# Patient Record
Sex: Female | Born: 1950 | Race: Black or African American | Hispanic: No | Smoking: Never smoker
Health system: Southern US, Community
[De-identification: ages and names within clinical notes are randomized; demographics above are authoritative.]

## PROBLEM LIST (undated history)

## (undated) DIAGNOSIS — R42 Dizziness and giddiness: Secondary | ICD-10-CM

## (undated) DIAGNOSIS — M810 Age-related osteoporosis without current pathological fracture: Secondary | ICD-10-CM

## (undated) DIAGNOSIS — J302 Other seasonal allergic rhinitis: Secondary | ICD-10-CM

## (undated) DIAGNOSIS — E039 Hypothyroidism, unspecified: Secondary | ICD-10-CM

## (undated) DIAGNOSIS — D649 Anemia, unspecified: Secondary | ICD-10-CM

## (undated) DIAGNOSIS — Z973 Presence of spectacles and contact lenses: Secondary | ICD-10-CM

## (undated) DIAGNOSIS — F419 Anxiety disorder, unspecified: Secondary | ICD-10-CM

## (undated) DIAGNOSIS — R7303 Prediabetes: Secondary | ICD-10-CM

## (undated) DIAGNOSIS — F32A Depression, unspecified: Secondary | ICD-10-CM

## (undated) DIAGNOSIS — M549 Dorsalgia, unspecified: Secondary | ICD-10-CM

## (undated) DIAGNOSIS — E876 Hypokalemia: Secondary | ICD-10-CM

## (undated) DIAGNOSIS — F329 Major depressive disorder, single episode, unspecified: Secondary | ICD-10-CM

## (undated) HISTORY — DX: Major depressive disorder, single episode, unspecified: F32.9

## (undated) HISTORY — DX: Dizziness and giddiness: R42

## (undated) HISTORY — PX: BREAST BIOPSY: SHX20

## (undated) HISTORY — DX: Depression, unspecified: F32.A

## (undated) HISTORY — DX: Dorsalgia, unspecified: M54.9

## (undated) HISTORY — PX: DILATION AND CURETTAGE OF UTERUS: SHX78

## (undated) HISTORY — DX: Anxiety disorder, unspecified: F41.9

## (undated) HISTORY — DX: Hypokalemia: E87.6

## (undated) HISTORY — PX: BREAST EXCISIONAL BIOPSY: SUR124

## (undated) HISTORY — PX: BACK SURGERY: SHX140

## (undated) HISTORY — DX: Prediabetes: R73.03

## (undated) HISTORY — PX: TUBAL LIGATION: SHX77

---

## 2001-06-27 ENCOUNTER — Encounter: Payer: Self-pay | Admitting: Obstetrics and Gynecology

## 2001-06-27 ENCOUNTER — Ambulatory Visit (HOSPITAL_COMMUNITY): Admission: RE | Admit: 2001-06-27 | Discharge: 2001-06-27 | Payer: Self-pay | Admitting: Obstetrics and Gynecology

## 2001-07-09 ENCOUNTER — Other Ambulatory Visit: Admission: RE | Admit: 2001-07-09 | Discharge: 2001-07-09 | Payer: Self-pay | Admitting: Obstetrics and Gynecology

## 2001-11-05 ENCOUNTER — Ambulatory Visit (HOSPITAL_COMMUNITY): Admission: RE | Admit: 2001-11-05 | Discharge: 2001-11-05 | Payer: Self-pay | Admitting: Internal Medicine

## 2001-11-05 ENCOUNTER — Encounter: Payer: Self-pay | Admitting: Internal Medicine

## 2002-07-10 ENCOUNTER — Encounter: Payer: Self-pay | Admitting: Internal Medicine

## 2002-07-10 ENCOUNTER — Ambulatory Visit (HOSPITAL_COMMUNITY): Admission: RE | Admit: 2002-07-10 | Discharge: 2002-07-10 | Payer: Self-pay | Admitting: Internal Medicine

## 2003-07-12 ENCOUNTER — Ambulatory Visit (HOSPITAL_COMMUNITY): Admission: RE | Admit: 2003-07-12 | Discharge: 2003-07-12 | Payer: Self-pay | Admitting: Obstetrics and Gynecology

## 2003-07-12 ENCOUNTER — Encounter: Payer: Self-pay | Admitting: Obstetrics and Gynecology

## 2004-01-28 ENCOUNTER — Encounter: Payer: Self-pay | Admitting: Family Medicine

## 2004-01-28 ENCOUNTER — Ambulatory Visit (HOSPITAL_COMMUNITY): Admission: RE | Admit: 2004-01-28 | Discharge: 2004-01-28 | Payer: Self-pay | Admitting: Internal Medicine

## 2004-01-28 LAB — HM COLONOSCOPY: HM Colonoscopy: NORMAL

## 2004-01-31 ENCOUNTER — Ambulatory Visit (HOSPITAL_COMMUNITY): Admission: RE | Admit: 2004-01-31 | Discharge: 2004-01-31 | Payer: Self-pay | Admitting: Internal Medicine

## 2005-07-30 ENCOUNTER — Ambulatory Visit (HOSPITAL_COMMUNITY): Admission: RE | Admit: 2005-07-30 | Discharge: 2005-07-30 | Payer: Self-pay | Admitting: Obstetrics and Gynecology

## 2006-08-05 ENCOUNTER — Ambulatory Visit (HOSPITAL_COMMUNITY): Admission: RE | Admit: 2006-08-05 | Discharge: 2006-08-05 | Payer: Self-pay | Admitting: Obstetrics and Gynecology

## 2007-07-11 ENCOUNTER — Ambulatory Visit: Payer: Self-pay | Admitting: Family Medicine

## 2007-07-17 ENCOUNTER — Encounter: Payer: Self-pay | Admitting: Family Medicine

## 2007-07-17 LAB — CONVERTED CEMR LAB
HDL: 63 mg/dL (ref >39)
Total CHOL/HDL Ratio: 3

## 2007-08-08 ENCOUNTER — Ambulatory Visit (HOSPITAL_COMMUNITY): Admission: RE | Admit: 2007-08-08 | Discharge: 2007-08-08 | Payer: Self-pay | Admitting: Family Medicine

## 2007-08-29 ENCOUNTER — Ambulatory Visit: Payer: Self-pay | Admitting: Family Medicine

## 2007-08-29 ENCOUNTER — Encounter: Payer: Self-pay | Admitting: Family Medicine

## 2007-08-29 ENCOUNTER — Other Ambulatory Visit: Admission: RE | Admit: 2007-08-29 | Discharge: 2007-08-29 | Payer: Self-pay | Admitting: Family Medicine

## 2007-08-29 LAB — CONVERTED CEMR LAB: Pap Smear: NORMAL

## 2008-01-01 ENCOUNTER — Encounter: Payer: Self-pay | Admitting: Family Medicine

## 2008-02-19 ENCOUNTER — Ambulatory Visit: Payer: Self-pay | Admitting: Family Medicine

## 2008-03-05 ENCOUNTER — Emergency Department (HOSPITAL_COMMUNITY): Admission: EM | Admit: 2008-03-05 | Discharge: 2008-03-05 | Payer: Self-pay | Admitting: Emergency Medicine

## 2008-03-09 ENCOUNTER — Ambulatory Visit: Payer: Self-pay | Admitting: Family Medicine

## 2008-03-18 ENCOUNTER — Ambulatory Visit (HOSPITAL_COMMUNITY): Admission: RE | Admit: 2008-03-18 | Discharge: 2008-03-18 | Payer: Self-pay | Admitting: Neurosurgery

## 2008-04-02 ENCOUNTER — Ambulatory Visit: Payer: Self-pay | Admitting: Family Medicine

## 2008-04-06 ENCOUNTER — Ambulatory Visit (HOSPITAL_COMMUNITY): Admission: RE | Admit: 2008-04-06 | Discharge: 2008-04-07 | Payer: Self-pay | Admitting: Neurosurgery

## 2008-04-07 ENCOUNTER — Encounter: Payer: Self-pay | Admitting: Family Medicine

## 2008-04-07 DIAGNOSIS — J309 Allergic rhinitis, unspecified: Secondary | ICD-10-CM

## 2008-04-07 DIAGNOSIS — M549 Dorsalgia, unspecified: Secondary | ICD-10-CM | POA: Insufficient documentation

## 2008-04-07 DIAGNOSIS — R42 Dizziness and giddiness: Secondary | ICD-10-CM

## 2008-05-18 ENCOUNTER — Ambulatory Visit: Payer: Self-pay | Admitting: Family Medicine

## 2008-08-10 ENCOUNTER — Ambulatory Visit (HOSPITAL_COMMUNITY): Admission: RE | Admit: 2008-08-10 | Discharge: 2008-08-10 | Payer: Self-pay | Admitting: Family Medicine

## 2008-10-26 ENCOUNTER — Other Ambulatory Visit: Admission: RE | Admit: 2008-10-26 | Discharge: 2008-10-26 | Payer: Self-pay | Admitting: Family Medicine

## 2008-10-26 ENCOUNTER — Encounter: Payer: Self-pay | Admitting: Family Medicine

## 2008-10-26 ENCOUNTER — Ambulatory Visit: Payer: Self-pay | Admitting: Family Medicine

## 2008-11-04 ENCOUNTER — Encounter: Payer: Self-pay | Admitting: Family Medicine

## 2008-11-09 ENCOUNTER — Ambulatory Visit: Payer: Self-pay | Admitting: Family Medicine

## 2008-11-09 DIAGNOSIS — F419 Anxiety disorder, unspecified: Secondary | ICD-10-CM | POA: Insufficient documentation

## 2008-11-09 DIAGNOSIS — F411 Generalized anxiety disorder: Secondary | ICD-10-CM

## 2008-11-09 LAB — CONVERTED CEMR LAB: Glucose, Bld: 88 mg/dL

## 2008-11-18 ENCOUNTER — Encounter: Payer: Self-pay | Admitting: Family Medicine

## 2008-11-19 LAB — CONVERTED CEMR LAB: TSH: 1.573 microintl units/mL (ref 0.350–4.50)

## 2008-11-24 ENCOUNTER — Encounter: Payer: Self-pay | Admitting: Family Medicine

## 2008-12-29 ENCOUNTER — Ambulatory Visit: Payer: Self-pay | Admitting: Family Medicine

## 2009-03-04 ENCOUNTER — Encounter: Payer: Self-pay | Admitting: Family Medicine

## 2009-04-06 ENCOUNTER — Encounter: Payer: Self-pay | Admitting: Family Medicine

## 2009-04-06 ENCOUNTER — Telehealth: Payer: Self-pay | Admitting: Family Medicine

## 2009-04-21 ENCOUNTER — Ambulatory Visit: Payer: Self-pay | Admitting: Family Medicine

## 2009-07-12 ENCOUNTER — Ambulatory Visit: Payer: Self-pay | Admitting: Family Medicine

## 2009-07-12 DIAGNOSIS — M25519 Pain in unspecified shoulder: Secondary | ICD-10-CM

## 2009-07-12 DIAGNOSIS — F329 Major depressive disorder, single episode, unspecified: Secondary | ICD-10-CM

## 2009-07-29 ENCOUNTER — Encounter: Payer: Self-pay | Admitting: Family Medicine

## 2009-08-04 ENCOUNTER — Ambulatory Visit (HOSPITAL_COMMUNITY): Payer: Self-pay | Admitting: Psychiatry

## 2009-08-12 ENCOUNTER — Ambulatory Visit (HOSPITAL_COMMUNITY): Payer: Self-pay | Admitting: Psychiatry

## 2009-08-16 ENCOUNTER — Ambulatory Visit (HOSPITAL_COMMUNITY): Admission: RE | Admit: 2009-08-16 | Discharge: 2009-08-16 | Payer: Self-pay | Admitting: Family Medicine

## 2009-08-18 ENCOUNTER — Ambulatory Visit (HOSPITAL_COMMUNITY): Payer: Self-pay | Admitting: Psychiatry

## 2009-08-30 ENCOUNTER — Ambulatory Visit: Payer: Self-pay | Admitting: Family Medicine

## 2009-08-30 DIAGNOSIS — G4700 Insomnia, unspecified: Secondary | ICD-10-CM | POA: Insufficient documentation

## 2009-09-02 ENCOUNTER — Ambulatory Visit (HOSPITAL_COMMUNITY): Payer: Self-pay | Admitting: Psychiatry

## 2009-09-06 ENCOUNTER — Telehealth: Payer: Self-pay | Admitting: Family Medicine

## 2009-09-16 ENCOUNTER — Ambulatory Visit (HOSPITAL_COMMUNITY): Payer: Self-pay | Admitting: Psychiatry

## 2009-09-22 ENCOUNTER — Ambulatory Visit (HOSPITAL_COMMUNITY): Payer: Self-pay | Admitting: Psychiatry

## 2009-10-03 ENCOUNTER — Encounter: Payer: Self-pay | Admitting: Family Medicine

## 2009-10-04 ENCOUNTER — Ambulatory Visit: Payer: Self-pay | Admitting: Family Medicine

## 2009-10-04 ENCOUNTER — Ambulatory Visit (HOSPITAL_COMMUNITY): Payer: Self-pay | Admitting: Psychiatry

## 2009-10-06 ENCOUNTER — Ambulatory Visit (HOSPITAL_COMMUNITY): Payer: Self-pay | Admitting: Psychiatry

## 2009-10-13 ENCOUNTER — Ambulatory Visit (HOSPITAL_COMMUNITY): Payer: Self-pay | Admitting: Psychiatry

## 2009-10-17 ENCOUNTER — Ambulatory Visit: Payer: Self-pay | Admitting: Family Medicine

## 2009-10-20 ENCOUNTER — Ambulatory Visit (HOSPITAL_COMMUNITY): Payer: Self-pay | Admitting: Psychiatry

## 2009-10-27 ENCOUNTER — Ambulatory Visit (HOSPITAL_COMMUNITY): Payer: Self-pay | Admitting: Psychiatry

## 2009-11-02 ENCOUNTER — Ambulatory Visit (HOSPITAL_COMMUNITY): Payer: Self-pay | Admitting: Psychiatry

## 2009-11-10 ENCOUNTER — Ambulatory Visit (HOSPITAL_COMMUNITY): Payer: Self-pay | Admitting: Psychiatry

## 2009-11-16 ENCOUNTER — Ambulatory Visit (HOSPITAL_COMMUNITY): Payer: Self-pay | Admitting: Psychiatry

## 2009-11-17 ENCOUNTER — Telehealth: Payer: Self-pay | Admitting: Family Medicine

## 2009-11-21 ENCOUNTER — Ambulatory Visit (HOSPITAL_COMMUNITY): Payer: Self-pay | Admitting: Psychiatry

## 2009-11-21 ENCOUNTER — Telehealth: Payer: Self-pay | Admitting: Family Medicine

## 2009-12-09 ENCOUNTER — Ambulatory Visit: Payer: Self-pay | Admitting: Family Medicine

## 2009-12-09 ENCOUNTER — Other Ambulatory Visit: Admission: RE | Admit: 2009-12-09 | Discharge: 2009-12-09 | Payer: Self-pay | Admitting: Family Medicine

## 2009-12-09 LAB — CONVERTED CEMR LAB
Bilirubin Urine: NEGATIVE
Blood in Urine, dipstick: NEGATIVE
Glucose, Urine, Semiquant: NEGATIVE
Nitrite: NEGATIVE
OCCULT 1: NEGATIVE
Urobilinogen, UA: NEGATIVE

## 2009-12-12 ENCOUNTER — Ambulatory Visit (HOSPITAL_COMMUNITY): Payer: Self-pay | Admitting: Psychiatry

## 2009-12-12 ENCOUNTER — Encounter: Payer: Self-pay | Admitting: Family Medicine

## 2009-12-12 LAB — CONVERTED CEMR LAB
BUN: 10 mg/dL (ref 6–23)
CO2: 20 meq/L (ref 19–32)
Cholesterol: 181 mg/dL (ref 0–200)
Creatinine, Ser: 0.63 mg/dL (ref 0.40–1.20)
Eosinophils Absolute: 0.1 10*3/uL (ref 0.0–0.7)
HDL: 55 mg/dL (ref >39)
Hemoglobin: 11.4 g/dL — ABNORMAL LOW (ref 12.0–15.0)
LDL Cholesterol: 107 mg/dL — ABNORMAL HIGH (ref 0–99)
Lymphocytes Relative: 37 % (ref 12–46)
MCHC: 31.3 g/dL (ref 30.0–36.0)
Monocytes Absolute: 0.4 10*3/uL (ref 0.1–1.0)
Potassium: 4.1 meq/L (ref 3.5–5.3)
RBC: 4.33 M/uL (ref 3.87–5.11)
Sodium: 143 meq/L (ref 135–145)
Total CHOL/HDL Ratio: 3.3
WBC: 4.8 10*3/uL (ref 4.0–10.5)

## 2009-12-20 ENCOUNTER — Ambulatory Visit (HOSPITAL_COMMUNITY): Payer: Self-pay | Admitting: Psychiatry

## 2010-01-31 ENCOUNTER — Ambulatory Visit (HOSPITAL_COMMUNITY): Payer: Self-pay | Admitting: Psychiatry

## 2010-02-08 ENCOUNTER — Ambulatory Visit (HOSPITAL_COMMUNITY): Payer: Self-pay | Admitting: Psychiatry

## 2010-02-21 ENCOUNTER — Ambulatory Visit (HOSPITAL_COMMUNITY): Payer: Self-pay | Admitting: Psychiatry

## 2010-02-28 ENCOUNTER — Ambulatory Visit (HOSPITAL_COMMUNITY): Payer: Self-pay | Admitting: Psychiatry

## 2010-03-07 ENCOUNTER — Ambulatory Visit (HOSPITAL_COMMUNITY): Payer: Self-pay | Admitting: Psychiatry

## 2010-03-10 ENCOUNTER — Encounter: Payer: Self-pay | Admitting: Family Medicine

## 2010-03-24 ENCOUNTER — Ambulatory Visit: Payer: Self-pay | Admitting: Family Medicine

## 2010-03-24 DIAGNOSIS — D4959 Neoplasm of unspecified behavior of other genitourinary organ: Secondary | ICD-10-CM

## 2010-03-28 ENCOUNTER — Ambulatory Visit (HOSPITAL_COMMUNITY): Payer: Self-pay | Admitting: Psychiatry

## 2010-04-10 ENCOUNTER — Encounter: Payer: Self-pay | Admitting: Family Medicine

## 2010-04-18 ENCOUNTER — Ambulatory Visit (HOSPITAL_COMMUNITY): Payer: Self-pay | Admitting: Psychiatry

## 2010-04-26 ENCOUNTER — Ambulatory Visit: Payer: Self-pay | Admitting: Family Medicine

## 2010-04-26 DIAGNOSIS — J209 Acute bronchitis, unspecified: Secondary | ICD-10-CM | POA: Insufficient documentation

## 2010-05-02 ENCOUNTER — Ambulatory Visit (HOSPITAL_COMMUNITY): Payer: Self-pay | Admitting: Psychiatry

## 2010-05-04 ENCOUNTER — Ambulatory Visit (HOSPITAL_COMMUNITY): Payer: Self-pay | Admitting: Psychiatry

## 2010-05-18 ENCOUNTER — Ambulatory Visit (HOSPITAL_COMMUNITY): Payer: Self-pay | Admitting: Psychiatry

## 2010-06-01 ENCOUNTER — Ambulatory Visit (HOSPITAL_COMMUNITY): Payer: Self-pay | Admitting: Psychiatry

## 2010-06-15 ENCOUNTER — Ambulatory Visit (HOSPITAL_COMMUNITY): Payer: Self-pay | Admitting: Psychiatry

## 2010-06-29 ENCOUNTER — Ambulatory Visit (HOSPITAL_COMMUNITY): Payer: Self-pay | Admitting: Psychiatry

## 2010-07-06 ENCOUNTER — Ambulatory Visit (HOSPITAL_COMMUNITY): Payer: Self-pay | Admitting: Psychiatry

## 2010-07-27 ENCOUNTER — Ambulatory Visit (HOSPITAL_COMMUNITY): Payer: Self-pay | Admitting: Psychiatry

## 2010-08-17 ENCOUNTER — Ambulatory Visit: Payer: Self-pay | Admitting: Family Medicine

## 2010-08-17 ENCOUNTER — Ambulatory Visit (HOSPITAL_COMMUNITY): Admission: RE | Admit: 2010-08-17 | Discharge: 2010-08-17 | Payer: Self-pay | Admitting: Family Medicine

## 2010-08-18 ENCOUNTER — Ambulatory Visit (HOSPITAL_COMMUNITY): Payer: Self-pay | Admitting: Psychiatry

## 2010-08-24 ENCOUNTER — Ambulatory Visit (HOSPITAL_COMMUNITY): Payer: Self-pay | Admitting: Psychiatry

## 2010-09-05 ENCOUNTER — Ambulatory Visit (HOSPITAL_COMMUNITY): Admission: RE | Admit: 2010-09-05 | Discharge: 2010-09-05 | Payer: Self-pay | Admitting: Family Medicine

## 2010-09-08 ENCOUNTER — Ambulatory Visit (HOSPITAL_COMMUNITY): Payer: Self-pay | Admitting: Psychiatry

## 2010-09-29 ENCOUNTER — Ambulatory Visit (HOSPITAL_COMMUNITY): Payer: Self-pay | Admitting: Psychiatry

## 2010-09-29 ENCOUNTER — Ambulatory Visit: Payer: Self-pay | Admitting: Family Medicine

## 2010-10-05 ENCOUNTER — Ambulatory Visit (HOSPITAL_COMMUNITY): Payer: Self-pay | Admitting: Psychiatry

## 2010-10-16 ENCOUNTER — Ambulatory Visit (HOSPITAL_COMMUNITY): Payer: Self-pay | Admitting: Psychiatry

## 2010-10-16 LAB — CONVERTED CEMR LAB
HDL: 56 mg/dL (ref >39)
Total CHOL/HDL Ratio: 3.8
Triglycerides: 96 mg/dL (ref <150)
VLDL: 19 mg/dL (ref 0–40)

## 2010-10-30 ENCOUNTER — Ambulatory Visit (HOSPITAL_COMMUNITY): Payer: Self-pay | Admitting: Psychiatry

## 2010-11-22 ENCOUNTER — Ambulatory Visit (HOSPITAL_COMMUNITY): Payer: Self-pay | Admitting: Psychiatry

## 2010-12-05 ENCOUNTER — Ambulatory Visit (HOSPITAL_COMMUNITY): Payer: Self-pay | Admitting: Psychiatry

## 2010-12-06 ENCOUNTER — Ambulatory Visit (HOSPITAL_COMMUNITY): Payer: Self-pay | Admitting: Psychiatry

## 2010-12-12 ENCOUNTER — Ambulatory Visit: Payer: Self-pay | Admitting: Family Medicine

## 2010-12-12 ENCOUNTER — Other Ambulatory Visit
Admission: RE | Admit: 2010-12-12 | Discharge: 2010-12-12 | Payer: Self-pay | Source: Home / Self Care | Admitting: Family Medicine

## 2010-12-12 DIAGNOSIS — M810 Age-related osteoporosis without current pathological fracture: Secondary | ICD-10-CM | POA: Insufficient documentation

## 2010-12-15 LAB — CONVERTED CEMR LAB: Pap Smear: NEGATIVE

## 2010-12-19 ENCOUNTER — Encounter: Payer: Self-pay | Admitting: Family Medicine

## 2010-12-27 ENCOUNTER — Ambulatory Visit (HOSPITAL_COMMUNITY): Payer: Self-pay | Admitting: Psychiatry

## 2011-01-17 ENCOUNTER — Ambulatory Visit (HOSPITAL_COMMUNITY)
Admission: RE | Admit: 2011-01-17 | Discharge: 2011-01-17 | Payer: Self-pay | Source: Home / Self Care | Attending: Psychiatry | Admitting: Psychiatry

## 2011-01-30 NOTE — Assessment & Plan Note (Signed)
Summary: sick- room 2   Vital Signs:  Patient profile:   60 year old female Menstrual status:  postmenopausal Height:      61 inches Weight:      107.75 pounds BMI:     20.43 O2 Sat:      97 % on Room air Pulse rate:   73 / minute Resp:     16 per minute BP sitting:   120 / 80  (left arm)  Vitals Entered By: Adella Hare LPN (April 26, 2010 1:29 PM) CC: sore throat, sneezing, coughing  x 4 days Is Patient Diabetic? No Pain Assessment Patient in pain? no      Comments patient did not bring medication today   Primary Provider:  Syliva Overman MD  CC:  sore throat, sneezing, and coughing  x 4 days.  History of Present Illness: This 60 Years Old Black Female comes in today with complaints of cough, runny nose, and sore throat.  She states that her syptoms started 5 days ago and have worsened.  She first noted that her voice was coming & going.  The next day is was worse & she had a sore throat.  The sore throat has now resolved.  She is having post nasal drainage, and a cough. No nasal congestion or drainage though. Her cough has changed from clear phlegm to brownish green.  No fever or chills. Nonsmoker. She has been using throat lozenges and honey with lemon.  Hx of seasonal allergies.  Has used claritin in past but is out.  Has worked well for her.     Allergies (verified): No Known Drug Allergies  Past History:  Past medical history reviewed for relevance to current acute and chronic problems.  Past Medical History: Reviewed history from 04/07/2008 and no changes required. Current Problems:  HYPOKALEMIA (ICD-276.8) ALLERGIC RHINITIS (ICD-477.9) BACK PAIN (ICD-724.5) VERTIGO, CHRONIC (ICD-780.4)  Review of Systems General:  Denies chills and fever. ENT:  Complains of postnasal drainage; denies earache, nasal congestion, sinus pressure, and sore throat. CV:  Denies chest pain or discomfort. Resp:  Complains of cough and sputum productive; denies shortness of  breath. Allergy:  Complains of seasonal allergies and sneezing.  Physical Exam  General:  Well-developed,well-nourished,in no acute distress; alert,appropriate and cooperative throughout examination Head:  Normocephalic and atraumatic without obvious abnormalities. No apparent alopecia or balding. Ears:  External ear exam shows no significant lesions or deformities.  Otoscopic examination reveals clear canals, tympanic membranes are intact bilaterally without bulging, retraction, inflammation or discharge. Hearing is grossly normal bilaterally. Nose:  no external deformity, no nasal discharge, and mucosal pallor.   Mouth:  Oral mucosa and oropharynx without lesions or exudates.   Neck:  No deformities, masses, or tenderness noted. Lungs:  Normal respiratory effort, chest expands symmetrically. Lungs are clear to auscultation, no crackles or wheezes. Heart:  Normal rate and regular rhythm. S1 and S2 normal without gallop, murmur, click, rub or other extra sounds. Cervical Nodes:  No lymphadenopathy noted Psych:  Cognition and judgment appear intact. Alert and cooperative with normal attention span and concentration. No apparent delusions, illusions, hallucinations   Impression & Recommendations:  Problem # 1:  ALLERGIC RHINITIS (ICD-477.9) Assessment Deteriorated  Her updated medication list for this problem includes:    Loratadine 10 Mg Tabs (Loratadine) .[name]... Take 1 daily for allergies  Problem # 2:  ACUTE BRONCHITIS (ICD-466.0) Assessment: New  Her updated medication list for this problem includes:    Zithromax Z-pak 250 Mg Tabs (  Azithromycin) .[name]... Take once daily as directed  Complete Medication List: 1)  Prozac 40 Mg Caps (Fluoxetine hcl) .... Take 1 tab by mouth at bedtime 2)  Zyprexa 2.5 Mg Tabs (Olanzapine) .... Take 1 tab by mouth at bedtime 3)  Oscal 500/200 D-3 500-200 Mg-unit Tabs (Calcium-vitamin d) .... Take 1 tablet by mouth two times a day 4)  Feosol 200 (65 Fe) Mg  Tabs (Ferrous sulfate dried) .... Take 1 tablet by mouth once a day 5)  Aspir-low 81 Mg Tbec (Aspirin) .... Take 1 tablet by mouth once a day 6)  Loratadine 10 Mg Tabs (Loratadine) .... Take 1 daily for allergies 7)  Zithromax Z-pak 250 Mg Tabs (Azithromycin) .... Take once daily as directed  Patient Instructions: 1)  Get plenty of rest, drink lots of clear liquids, and use Tylenol or Ibuprofen for fever and comfort. Return in 7-10 days if you're not better:sooner if you're feeling worse. 2)  Increase fluids. 3)  Voice rest. 4)  You may use cough drops or throat lozenges to help soothe your throat. 5)  I have prescribed Loratadine for allergies, and Zithromax for your antibiotic. Prescriptions: ZITHROMAX Z-PAK 250 MG TABS (AZITHROMYCIN) take once daily as directed  #1 pack x 0   Entered and Authorized by:   Esperanza Sheets PA   Signed by:   Esperanza Sheets PA on 04/26/2010   Method used:   Electronically to        [facility] (retail)       [address]       [place], [place]  [address]       Ph: [phone]       Fax: [phone]   RxID:   [id number] LORATADINE 10 MG TABS (LORATADINE) take 1 daily for allergies  #30 x 5   Entered and Authorized by:   Esperanza Sheets PA   Signed by:   Esperanza Sheets PA on 04/26/2010   Method used:   Electronically to        [facility] (retail)       [address]       [place], [place]  [address]       Ph: [phone]       Fax: [phone]   RxID:   [id number] HYDROCODONE-ACETAMINOPHEN 7.5-325 MG TABS (HYDROCODONE-ACETAMINOPHEN) one tab every 4 hrs as needed  #30 x 0   Entered and Authorized by:   Esperanza Sheets PA   Signed by:   Esperanza Sheets PA on 04/26/2010   Method used:   Printed then faxed to ...       [facility] [address] [phone]* (retail)       [address] [place], [place]  [address]       Ph: [phone]       Fax: [phone]   RxID:   [id number] AMRIX 15 MG  XR24H-CAP (CYCLOBENZAPRINE HCL) take 1 daily at dinner time for muscle spasm  #30 x 0   Entered and Authorized by:   Esperanza Sheets PA   Signed by:   Esperanza Sheets PA on 04/26/2010   Method used:   Printed then faxed to ...       [facility] [address] [phone]* (retail)       [address]       [place], [place]  [address]       Ph: [phone]       Fax: [phone]   RxID:   [id number] MELOXICAM 7.5 MG TABS (MELOXICAM) take 1-2 daily for inflammation & pain  #30 x 0   Entered and Authorized by:   Esperanza Sheets PA   Signed by:   Esperanza Sheets PA on 04/26/2010   Method used:   Printed then faxed to ...       [facility] [address] [phone]* (retail)       [address] [place]  [address]       Ph: [phone]       Fax: [phone]   RxID:   [id number]  this medications were entered in error, not sent to pharmacy Adella Hare LPN  April 26, 2010 1:58 PM

## 2011-01-30 NOTE — Letter (Signed)
Summary: phone notes  phone notes   Imported By: Curtis Sites 06/19/2010 15:31:35  _____________________________________________________________________  External Attachment:    Type:   Image     Comment:   External Document

## 2011-01-30 NOTE — Letter (Signed)
Summary: labs  labs   Imported By: Curtis Sites 06/19/2010 15:31:01  _____________________________________________________________________  External Attachment:    Type:   Image     Comment:   External Document

## 2011-01-30 NOTE — Letter (Signed)
Summary: consults  consults   Imported By: Curtis Sites 06/19/2010 15:30:10  _____________________________________________________________________  External Attachment:    Type:   Image     Comment:   External Document

## 2011-01-30 NOTE — Letter (Signed)
Summary: demographic  demographic   Imported By: Curtis Sites 06/19/2010 15:30:28  _____________________________________________________________________  External Attachment:    Type:   Image     Comment:   External Document

## 2011-01-30 NOTE — Assessment & Plan Note (Signed)
Summary: FOLLOW UP   Vital Signs:  Patient profile:   60 year old female Menstrual status:  postmenopausal Height:      61 inches Weight:      107 pounds BMI:     20.29 O2 Sat:      97 % Pulse rate:   68 / minute Pulse rhythm:   regular Resp:     16 per minute BP sitting:   118 / 70  (left arm) Cuff size:   regular  Vitals Entered By: Everitt Amber LPN (March 24, 2010 9:06 AM) CC: Follow up chronic problems, has some skin tags on her bottom and they have started itching , wants to see about getting them removed   Primary Care Provider:  Syliva Overman MD  CC:  Follow up chronic problems, has some skin tags on her bottom and they have started itching , and wants to see about getting them removed.  History of Present Illness: Reports  thatshe is doing much better. Denies recent fever or chills. Denies sinus pressure, nasal congestion , ear pain or sore throat.She was recently treated in the Ed for sinus infection Denies chest congestion, or cough productive of sputum. Denies chest pain, palpitations, PND, orthopnea or leg swelling. Denies nausea, vomitting, diarrhea or constipation. Denies change in bowel movements or bloody stool. Denies dysuria , frequency, incontinence or hesitancy. . Denies headaches, vertigo, seizures. Denies uncontrolled depression, anxiety or insomnia.she follows with  mental,health regularly, she denies suicidal or homicidal use, she denies hallucinations. reports marked improvement in her anxiety    Current Medications (verified): 1)  Prozac 40 Mg Caps (Fluoxetine Hcl) .... Take 1 Tab By Mouth At Bedtime 2)  Zyprexa 2.5 Mg Tabs (Olanzapine) .... Take 1 Tab By Mouth At Bedtime 3)  Hydrocodone-Acetaminophen 7.5-325 Mg Tabs (Hydrocodone-Acetaminophen) .... One Tab Every 4 Hrs As Needed 4)  Oscal 500/200 D-3 500-200 Mg-Unit Tabs (Calcium-Vitamin D) .... Take 1 Tablet By Mouth Two Times A Day 5)  Feosol 200 (65 Fe) Mg Tabs (Ferrous Sulfate Dried) ....  Take 1 Tablet By Mouth Once A Day 6)  Aspir-Low 81 Mg Tbec (Aspirin) .... Take 1 Tablet By Mouth Once A Day  Allergies (verified): No Known Drug Allergies  Past History:  Past medical, surgical, family and social histories (including risk factors) reviewed, and no changes noted (except as noted below).  Past Medical History: Reviewed history from 04/07/2008 and no changes required. Current Problems:  HYPOKALEMIA (ICD-276.8) ALLERGIC RHINITIS (ICD-477.9) BACK PAIN (ICD-724.5) VERTIGO, CHRONIC (ICD-780.4)  Past Surgical History: Reviewed history from 10/26/2008 and no changes required. Back surgery (1992) and 2009 Dr. Jeral Fruit Tubal ligation (1976)  Family History: Reviewed history from 04/07/2008 and no changes required. Mother HTN<Diabetic Father died  age 63  Sisters 5living and 3 brothers living Two siblings are known as HTN,Diabetic, one asthmatic, and one has thyroid disease  Social History: Reviewed history from 04/07/2008 and no changes required. Disabled since 03/2008  from back pain, had surgery Separated  in 2010, October Three children  Never Smoked Alcohol use-no Drug use-no  Review of Systems      See HPI Eyes:  Denies blurring and discharge. ENT:  Complains of postnasal drainage and sinus pressure; treated fior sinus infwection Feb 18, 2010, currently asymptomatic. GI:  Complains of indigestion; epigastric discomfort last night had not eaten  her supper which was late, pain relieved with activity and also food, no other symptoms. MS:  Complains of joint pain; left elbow pain, uses naprosyn as  needed and hydrocodne as needed from ortho also for her back uses as needed on avg one per week. Derm:  Complains of itching and lesion(s); vulvar and perianal puritic lesions x 2 months , has had before and wants them removed. Endo:  Denies cold intolerance, excessive hunger, excessive thirst, excessive urination, heat intolerance, polyuria, and weight change. Heme:   Denies abnormal bruising and bleeding. Allergy:  Complains of seasonal allergies and sneezing; intermittent, varying times.  Physical Exam  General:  Well-developed,well-nourished,in no acute distress; alert,appropriate and cooperative throughout examination HEENT: No facial asymmetry,  EOMI, No sinus tenderness, TM's Clear, oropharynx  pink and moist.   Chest: Clear to auscultation bilaterally.  CVS: S1, S2, No murmurs, No S3.   Abd: Soft, Nontender.  MS: Adequate ROM spine, hips, shoulders and knees.  Ext: No edema.   CNS: CN 2-12 intact, power tone and sensation normal throughout.   Skin: Intact, no visible lesions or rashes.  Psych: Good eye contact, normal affect.  Memory intact, not anxious or depressed appearing. 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 Take 1 tab by mouth at bedtime  Problem # 4:  ANXIETY STATE, UNSPECIFIED (ICD-300.00) Assessment: Improved  Her updated medication list for this problem includes:    Prozac 40 Mg Caps (Fluoxetine hcl) .[name]... Take 1 tab by mouth at bedtime  Problem # 5:  ALLERGIC RHINITIS (ICD-477.9) Assessment: Unchanged  The following medications were removed from the medication list:    Claritin 10 Mg Tabs (Loratadine) .[name]... Take 1 tablet by mouth once a day  Complete Medication List: 1)  Prozac 40 Mg Caps (Fluoxetine hcl) .... Take 1 tab by mouth at bedtime 2)  Zyprexa 2.5 Mg Tabs (Olanzapine) .... Take 1 tab by mouth at bedtime 3)  Hydrocodone-acetaminophen 7.5-325 Mg Tabs (Hydrocodone-acetaminophen)  .... One tab every 4 hrs as needed 4)  Oscal 500/200 D-3 500-200 Mg-unit Tabs (Calcium-vitamin d) .... Take 1 tablet by mouth two times a day 5)  Feosol 200 (65 Fe) Mg Tabs (Ferrous sulfate dried) .... Take 1 tablet by mouth once a day 6)  Aspir-low 81 Mg Tbec (Aspirin) .... Take 1 tablet by mouth once a day  Other Orders: Tdap => 63yrs IM ([address]09) Admin 1st Vaccine ([address]) Admin 1st Vaccine [facility]) [phone])  Patient Instructions: 1)  F/U in 4.5 months. 2)  I am extremely happy that you are doing so well. 3)  Call earlier if problems. 4)  you are being referred to dermatology for removal of the lesions.    Tetanus/Td Vaccine    Vaccine Type: Tdap    Site: left deltoid    Mfr: Sanofi Pasteur    Dose: 0.5 ml    Route: IM    Given by: Everitt Amber LPN    Exp. Date: 04/06/2012    Lot #: [id number]

## 2011-01-30 NOTE — Letter (Signed)
Summary: misc.  misc.   Imported By: Curtis Sites 06/19/2010 15:31:15  _____________________________________________________________________  External Attachment:    Type:   Image     Comment:   External Document

## 2011-01-30 NOTE — Letter (Signed)
Summary: history and physical  history and physical   Imported By: Curtis Sites 06/19/2010 15:30:45  _____________________________________________________________________  External Attachment:    Type:   Image     Comment:   External Document

## 2011-01-30 NOTE — Letter (Signed)
Summary: MEDICAL RELEASE  MEDICAL RELEASE   Imported By: Lind Guest 03/10/2010 13:48:55  _____________________________________________________________________  External Attachment:    Type:   Image     Comment:   External Document

## 2011-01-30 NOTE — Letter (Signed)
Summary: progress notes  progress notes   Imported By: Curtis Sites 06/19/2010 15:31:52  _____________________________________________________________________  External Attachment:    Type:   Image     Comment:   External Document

## 2011-01-30 NOTE — Assessment & Plan Note (Signed)
Summary: office visit   Vital Signs:  Patient profile:   60 year old female Menstrual status:  postmenopausal Height:      61 inches Weight:      109.25 pounds BMI:     20.72 O2 Sat:      99 % on Room air Pulse rate:   64 / minute Pulse rhythm:   regular Resp:     16 per minute BP sitting:   120 / 80  (left arm)  Vitals Entered By: Adella Hare LPN (August 17, 2010 9:23 AM)  O2 Flow:  Room air CC: follow-up visit Is Patient Diabetic? No Pain Assessment Patient in pain? no        Primary Care Provider:  Syliva Overman MD  CC:  follow-up visit.  History of Present Illness: Reports  that tshe has been doing well. Denies recent fever or chills. Denies sinus pressure, nasal congestion , ear pain or sore throat. Denies chest congestion, or cough productive of sputum. Denies chest pain, palpitations, PND, orthopnea or leg swelling. Denies abdominal pain, nausea, vomitting, diarrhea or constipation. Denies change in bowel movements or bloody stool. Denies dysuria , frequency, incontinence or hesitancy. Denies  joint pain, swelling, or reduced mobility. Denies headaches, vertigo, seizures. Denies depression, anxiety or insomnia.She reports adequate control and excellent rsponse to hermeds  Denies  rash, lesions, or itch.     Current Medications (verified): 1)  Prozac 40 Mg Caps (Fluoxetine Hcl) .... Take 1 Tab By Mouth At Bedtime 2)  Zyprexa 5 Mg Tabs (Olanzapine) .... One Tab By Mouth Once Daily  Allergies (verified): No Known Drug Allergies  Review of Systems      See HPI General:  Complains of fatigue. Eyes:  Denies eye pain and red eye. MS:  Complains of joint pain; cortisone injection in right shoulder 3 weeks ago. Endo:  Denies cold intolerance, excessive thirst, excessive urination, and heat intolerance. Heme:  Denies abnormal bruising and bleeding. Allergy:  Complains of seasonal allergies; denies hives or rash and itching eyes.  Physical  Exam  General:  Well-developed,well-nourished,in no acute distress; alert,appropriate and cooperative throughout examination HEENT: No facial asymmetry,  EOMI, No sinus tenderness, TM's Clear, oropharynx  pink and moist.   Chest: Clear to auscultation bilaterally.  CVS: S1, S2, No murmurs, No S3.   Abd: Soft, Nontender.  MS: Adequate ROM spine, hips, shoulders and knees.  Ext: No edema.   CNS: CN 2-12 intact, power tone and sensation normal throughout.   Skin: Intact, no visible lesions or rashes.  Psych: Good eye contact, normal affect.  Memory intact, mildly  anxious not depressed appearing.    Impression & Recommendations:  Problem # 1:  SHOULDER PAIN, LEFT (ICD-719.41) Assessment Improved  The following medications were removed from the medication list:    Aspir-low 81 Mg Tbec (Aspirin) .[name]... Take 1 tablet by mouth once a day Her updated medication list for this problem includes:    Aspir-low 81 Mg Tbec (Aspirin) .[name]... Take 1 tablet by mouth once a day  Problem # 2:  ANXIETY STATE, UNSPECIFIED (ICD-300.00) Assessment: Improved  Her updated medication list for this problem includes:    Prozac 40 Mg Caps (Fluoxetine hcl) .[name]... Take 1 tab by mouth at bedtime  Problem # 3:  BACK PAIN (ICD-724.5) Assessment: Improved  The following medications were removed from the medication list:    Aspir-low 81 Mg Tbec (Aspirin) .[name]... Take 1 tablet by mouth once a day Her updated medication list for this  problem includes:    Aspir-low 81 Mg Tbec (Aspirin) .[name]... Take 1 tablet by mouth once a day  Complete Medication List: 1)  Prozac 40 Mg Caps (Fluoxetine hcl) .... Take 1 tab by mouth at bedtime 2)  Zyprexa 5 Mg Tabs (Olanzapine) .... One tab by mouth once daily 3)  Aspir-low 81 Mg Tbec (Aspirin) .... Take 1 tablet by mouth once a day 4)  Oscal 500/200 D-3 500-200 Mg-unit Tabs (Calcium-vitamin d) .... Take 1 tablet by mouth two times a day  Other Orders: T-Basic Metabolic Panel  ([id number]) T-Lipid Profile [phone]) T-CBC w/Diff [phone]) T-TSH [phone]) T-Vitamin D (25-Hydroxy) [phone]) Future Orders: Radiology Referral (Radiology) ... 08/18/2010  Patient Instructions: 1)  CPE December 11 or after. 2)  BMP prior to visit, ICD-9: 3)  Lipid Panel prior to visit, ICD-9: 4)  TSH prior to visit, ICD-9:   fasting in December 5)  CBC w/ Diff prior to visit, ICD-9: 6)  Vit D Prescriptions: ASPIR-LOW 81 MG TBEC (ASPIRIN) Take 1 tablet by mouth once a day  #30 x 0   Entered and Authorized by:   Syliva Overman MD   Signed by:   Syliva Overman MD on 08/17/2010   Method used:   Historical   RxID:   [id number]

## 2011-01-30 NOTE — Assessment & Plan Note (Signed)
Summary: flu shot  Nurse Visit   Allergies: No Known Drug Allergies  Immunizations Administered:  Influenza Vaccine # 1:    Vaccine Type: Fluvax MCR    Site: right deltoid    Mfr: novartis    Dose: 0.5 ml    Route: IM    Given by: Adella Hare LPN    Exp. Date: 05/2011    Lot #: [id number]    VIS given: 07/25/10 version given October 02, 2010.  Orders Added: 1)  Influenza Vaccine MCR [00025]

## 2011-01-30 NOTE — Letter (Signed)
Summary: xray  xray   Imported By: Curtis Sites 06/19/2010 15:32:08  _____________________________________________________________________  External Attachment:    Type:   Image     Comment:   External Document

## 2011-01-30 NOTE — Progress Notes (Signed)
Summary: DERMATOLOGY  DERMATOLOGY   Imported By: Lind Guest 05/17/2010 08:00:36  _____________________________________________________________________  External Attachment:    Type:   Image     Comment:   External Document

## 2011-02-01 ENCOUNTER — Encounter (INDEPENDENT_AMBULATORY_CARE_PROVIDER_SITE_OTHER): Payer: 59 | Admitting: Psychiatry

## 2011-02-01 ENCOUNTER — Ambulatory Visit (HOSPITAL_COMMUNITY): Admit: 2011-02-01 | Payer: Self-pay | Admitting: Psychiatry

## 2011-02-01 DIAGNOSIS — F331 Major depressive disorder, recurrent, moderate: Secondary | ICD-10-CM

## 2011-02-01 NOTE — Assessment & Plan Note (Signed)
Summary: physical   Vital Signs:  Patient profile:   60 year old female Menstrual status:  postmenopausal Height:      61 inches Weight:      113.75 pounds BMI:     21.57 O2 Sat:      99 % on Room air Pulse rate:   60 / minute Resp:     16 per minute BP sitting:   120 / 70  (left arm)  Vitals Entered By: Adella Hare LPN (December 12, 2010 9:41 AM)  O2 Flow:  Room air CC: physical Is Patient Diabetic? No Pain Assessment Patient in pain? no        Primary Care Kayli Beal:  Syliva Overman MD  CC:  physical.  History of Present Illness: Reports  that she has been doing well. Denies recent fever or chills. Denies sinus pressure, nasal congestion , ear pain or sore throat. Denies chest congestion, or cough productive of sputum. Denies chest pain, palpitations, PND, orthopnea or leg swelling. Denies abdominal pain, nausea, vomitting, diarrhea or constipation. Denies change in bowel movements or bloody stool. Denies dysuria , frequency, incontinence or hesitancy. Denies  joint pain, swelling, or reduced mobility. Denies headaches, vertigo, seizures. Denies uncontrolled  depression, anxiety or insomnia. Denies  rash, lesions, or itch.     Current Medications (verified): 1)  Prozac 40 Mg Caps (Fluoxetine Hcl) .... Take 1 Tab By Mouth At Bedtime 2)  Zyprexa 5 Mg Tabs (Olanzapine) .... One Tab By Mouth Once Daily 3)  Aspir-Low 81 Mg Tbec (Aspirin) .... Take 1 Tablet By Mouth Once A Day 4)  Oscal 500/200 D-3 500-200 Mg-Unit Tabs (Calcium-Vitamin D) .... Take 1 Tablet By Mouth Two Times A Day 5)  Fosamax 70 Mg Tabs (Alendronate Sodium) .... One Tab Once A Week 6)  Benztropine Mesylate 0.5 Mg Tabs (Benztropine Mesylate) .... One Tab By Mouth At Bedtime 7)  Ferrous Sulfate 325 (65 Fe) Mg Tabs (Ferrous Sulfate) .... One Tab By Mouth Once Daily  Allergies (verified): No Known Drug Allergies  Review of Systems      See HPI Eyes:  Denies discharge, eye pain, and red  eye. Psych:  Complains of anxiety, depression, and mental problems; denies suicidal thoughts/plans, thoughts of violence, and unusual visions or sounds; marked improvement on meds. Endo:  Denies cold intolerance, excessive hunger, excessive thirst, and excessive urination. Heme:  Denies abnormal bruising and bleeding. Allergy:  Complains of seasonal allergies; denies hives or rash and itching eyes.  Physical Exam  General:  Well-developed,well-nourished,in no acute distress; alert,appropriate and cooperative throughout examination Head:  Normocephalic and atraumatic without obvious abnormalities. No apparent alopecia or balding. Eyes:  vision grossly intact, pupils equal, and pupils reactive to light.   Ears:  External ear exam shows no significant lesions or deformities.  Otoscopic examination reveals clear canals, tympanic membranes are intact bilaterally without bulging, retraction, inflammation or discharge. Hearing is grossly normal bilaterally. Nose:  External nasal examination shows no deformity or inflammation. Nasal mucosa are pink and moist without lesions or exudates. Mouth:  pharynx pink and moist and fair dentition.   Neck:  No deformities, masses, or tenderness noted. Chest Wall:  No deformities, masses, or tenderness noted. Breasts:  No mass, nodules, thickening, tenderness, bulging, retraction, inflamation, nipple discharge or skin changes noted.   Lungs:  Normal respiratory effort, chest expands symmetrically. Lungs are clear to auscultation, no crackles or wheezes. Heart:  Normal rate and regular rhythm. S1 and S2 normal without gallop, murmur, click, rub or  other extra sounds. Abdomen:  Bowel sounds positive,abdomen soft and non-tender without masses, organomegaly or hernias noted. Rectal:  No external abnormalities noted. Normal sphincter tone. No rectal masses or tenderness. Genitalia:  Normal introitus for age, no external lesions, no vaginal discharge, mucosa pink and  moist, no vaginal or cervical lesions, no vaginal atrophy, no friaility or hemorrhage, normal uterus size and position, no adnexal masses or tenderness Msk:  No deformity or scoliosis noted of thoracic or lumbar spine.   Pulses:  R and L carotid,radial,femoral,dorsalis pedis and posterior tibial pulses are full and equal bilaterally Extremities:  No clubbing, cyanosis, edema, or deformity noted with normal full range of motion of all joints.   Neurologic:  No cranial nerve deficits noted. Station and gait are normal. Plantar reflexes are down-going bilaterally. DTRs are symmetrical throughout. Sensory, motor and coordinative functions appear intact. Skin:  Intact without suspicious lesions or rashes Cervical Nodes:  No lymphadenopathy noted Axillary Nodes:  No palpable lymphadenopathy Inguinal Nodes:  No significant adenopathy Psych:  Cognition and judgment appear intact. Alert and cooperative with normal attention span and concentration. No apparent delusions, illusions, hallucinations   Impression & Recommendations:  Problem # 1:  OSTEOPOROSIS (ICD-733.00) Assessment Comment Only  Her updated medication list for this problem includes:    Fosamax 70 Mg Tabs (Alendronate sodium) ..... One tab once a week pt also vit D deficient and will be on prescription med for this Orders: Medicare Electronic Prescription [phone])  Problem # 2:  PHYSICAL EXAMINATION (ICD-V70.0) Assessment: Comment Only pap sent. rectal exam reveals guaic neg stool  Problem # 3:  ANXIETY STATE, UNSPECIFIED (ICD-300.00) Assessment: Improved  Her updated medication list for this problem includes:    Prozac 40 Mg Caps (Fluoxetine hcl) .[name]... Take 1 tab by mouth at bedtime  Complete Medication List: 1)  Prozac 40 Mg Caps (Fluoxetine hcl) .... Take 1 tab by mouth at bedtime 2)  Zyprexa 5 Mg Tabs (Olanzapine) .... One tab by mouth once daily 3)  Aspir-low 81 Mg Tbec (Aspirin) .... Take 1 tablet by mouth once a day 4)   Oscal 500/200 D-3 500-200 Mg-unit Tabs (Calcium-vitamin d) .... Take 1 tablet by mouth two times a day 5)  Fosamax 70 Mg Tabs (Alendronate sodium) .... One tab once a week 6)  Benztropine Mesylate 0.5 Mg Tabs (Benztropine mesylate) .... One tab by mouth at bedtime 7)  Ferrous Sulfate 325 (65 Fe) Mg Tabs (Ferrous sulfate) .... One tab by mouth once daily 8)  Vitamin D (ergocalciferol) 50000 Unit Caps (Ergocalciferol) .... One capsule once weekly  Other Orders: T-Lipid Profile [id number]) T-Vitamin D (25-Hydroxy) [id number]) Hemoccult Guaiac-1 spec.(in office) (82270) Pap Smear (13086)  Patient Instructions: 1)  Follow up appointment in 4 months. 2)  you are doing very well.pls start getting involved in the community, no watching tV all day only. 3)  New med vit D deficiency. 4)  pls follow a low fat diet. 5)  Lipid Panel prior to visit, ICD-9: and vit D level fasting in 4 months Prescriptions: VITAMIN D (ERGOCALCIFEROL) 50000 UNIT CAPS (ERGOCALCIFEROL) one capsule once weekly  #4 x 4   Entered by:   Adella Hare LPN   Authorized by:   Syliva Overman MD   Signed by:   Adella Hare LPN on 57/84/6962   Method used:   Electronically to        [facility]* (retail)       [address]  [place], [place]  [address]       Ph: [phone]       Fax: [phone]   RxID:   [id number] VITAMIN D (ERGOCALCIFEROL) 50000 UNIT CAPS (ERGOCALCIFEROL) one capsule once weekly  #4 x 4   Entered and Authorized by:   Syliva Overman MD   Signed by:   Syliva Overman MD on 12/12/2010   Method used:   Electronically to        [facility]* (retail)       [address]       [place], [place]  [address]       Ph: [phone]       Fax: [phone]   RxID:   [id number]    Orders Added: 1)  Est. Patient 40-64 years [99396] 2)  Medicare Electronic Prescription [G8553] 3)  T-Lipid Profile [id number] 4)  T-Vitamin D  (25-Hydroxy) [phone] 5)  Hemoccult Guaiac-1 spec.(in office) [82270] 6)  Pap Smear [88150]    Laboratory Results  Date/Time Received: December 12, 2010 10:07 AM  Date/Time Reported: December 12, 2010 10:07 AM   Stool - Occult Blood Hemmoccult #1: negative Date: 12/12/2010

## 2011-02-01 NOTE — Letter (Signed)
Summary: Pap Smear, Normal Letter, [facility]  [address]   [place], [place] [address]   Phone: [phone]  Fax: [phone]          December 19, 2010    Dear: Megan Gibbs    I am pleased to notify you that your PAP smear was normal.  You will need your next PAP smear in:     ____ 3 Months    ____ 6 Months    ____ 12 Months    Please call the office at our office number above, to schedule your next appointment.    Sincerely,     [facility]

## 2011-02-07 ENCOUNTER — Encounter (INDEPENDENT_AMBULATORY_CARE_PROVIDER_SITE_OTHER): Payer: 59 | Admitting: Psychiatry

## 2011-02-07 DIAGNOSIS — F411 Generalized anxiety disorder: Secondary | ICD-10-CM

## 2011-02-07 DIAGNOSIS — F331 Major depressive disorder, recurrent, moderate: Secondary | ICD-10-CM

## 2011-02-21 ENCOUNTER — Encounter (INDEPENDENT_AMBULATORY_CARE_PROVIDER_SITE_OTHER): Payer: 59 | Admitting: Psychiatry

## 2011-02-21 DIAGNOSIS — F411 Generalized anxiety disorder: Secondary | ICD-10-CM

## 2011-02-21 DIAGNOSIS — F322 Major depressive disorder, single episode, severe without psychotic features: Secondary | ICD-10-CM

## 2011-02-25 ENCOUNTER — Encounter: Payer: Self-pay | Admitting: Family Medicine

## 2011-02-26 LAB — CONVERTED CEMR LAB: Total CHOL/HDL Ratio: 3.8

## 2011-02-27 ENCOUNTER — Encounter: Payer: Self-pay | Admitting: Family Medicine

## 2011-03-06 ENCOUNTER — Encounter (INDEPENDENT_AMBULATORY_CARE_PROVIDER_SITE_OTHER): Payer: 59 | Admitting: Psychiatry

## 2011-03-06 DIAGNOSIS — F331 Major depressive disorder, recurrent, moderate: Secondary | ICD-10-CM

## 2011-03-08 NOTE — Letter (Signed)
Summary: d/c medicine  d/c medicine   Imported By: Lind Guest 02/27/2011 13:53:07  _____________________________________________________________________  External Attachment:    Type:   Image     Comment:   External Document

## 2011-03-08 NOTE — Miscellaneous (Signed)
  Clinical Lists Changes  Medications: Removed medication of VITAMIN D (ERGOCALCIFEROL) 50000 UNIT CAPS (ERGOCALCIFEROL) one capsule once weekly

## 2011-03-26 ENCOUNTER — Encounter (HOSPITAL_COMMUNITY): Payer: 59 | Admitting: Psychiatry

## 2011-03-28 ENCOUNTER — Encounter (INDEPENDENT_AMBULATORY_CARE_PROVIDER_SITE_OTHER): Payer: 59 | Admitting: Psychiatry

## 2011-03-28 DIAGNOSIS — F322 Major depressive disorder, single episode, severe without psychotic features: Secondary | ICD-10-CM

## 2011-04-03 ENCOUNTER — Encounter (INDEPENDENT_AMBULATORY_CARE_PROVIDER_SITE_OTHER): Payer: 59 | Admitting: Psychiatry

## 2011-04-03 DIAGNOSIS — F331 Major depressive disorder, recurrent, moderate: Secondary | ICD-10-CM

## 2011-04-12 ENCOUNTER — Encounter: Payer: Self-pay | Admitting: Family Medicine

## 2011-04-13 ENCOUNTER — Encounter: Payer: Self-pay | Admitting: Family Medicine

## 2011-04-16 ENCOUNTER — Ambulatory Visit: Payer: Self-pay | Admitting: Family Medicine

## 2011-04-16 ENCOUNTER — Encounter (INDEPENDENT_AMBULATORY_CARE_PROVIDER_SITE_OTHER): Payer: 59 | Admitting: Psychiatry

## 2011-04-16 DIAGNOSIS — F411 Generalized anxiety disorder: Secondary | ICD-10-CM

## 2011-04-16 DIAGNOSIS — F322 Major depressive disorder, single episode, severe without psychotic features: Secondary | ICD-10-CM

## 2011-04-17 ENCOUNTER — Encounter: Payer: Self-pay | Admitting: Family Medicine

## 2011-04-17 ENCOUNTER — Ambulatory Visit (INDEPENDENT_AMBULATORY_CARE_PROVIDER_SITE_OTHER): Payer: 59 | Admitting: Family Medicine

## 2011-04-17 VITALS — BP 110/60 | HR 72 | Resp 16 | Ht 62.0 in | Wt 113.1 lb

## 2011-04-17 DIAGNOSIS — R5383 Other fatigue: Secondary | ICD-10-CM

## 2011-04-17 DIAGNOSIS — R5381 Other malaise: Secondary | ICD-10-CM

## 2011-04-17 DIAGNOSIS — H6120 Impacted cerumen, unspecified ear: Secondary | ICD-10-CM

## 2011-04-17 NOTE — Patient Instructions (Addendum)
F/ u in 5 months.Youwill need the zostavax at that time.  You have cerumen in your left ear and this will be irrigated.  No med changes at this time  Mammogram is due in August , pls call in July to schedule  Pls keep appts with mental health  A healthy diet is rich in fruit, vegetables and whole grains. Poultry fish, nuts and beans are a healthy choice for protein rather then red meat. A low sodium diet and drinking 64 ounces of water daily is generally recommended. Oils and sweet should be limited. Carbohydrates especially for those who are diabetic or overweight, should be limited to 34-45 gram per meal. It is important to eat on a regular schedule, at least 3 times daily. Snacks should be primarily fruits, vegetables or nuts.

## 2011-04-17 NOTE — Progress Notes (Signed)
  Subjective:    Patient ID: Megan Gibbs, female    DOB: 1951/05/27, 60 y.o.   MRN: [id number]  HPI The PT is here for follow up and re-evaluation of chronic medical conditions, medication management and review of recent lab and radiology data.  Preventive health is updated, specifically  Cancer screening, and Immunization.   Questions or concerns regarding consultations or procedures which the PT has had in the interim are  addressed. The PT denies any adverse reactions to current medications since the last visit.  She c/o unilateral ear pressure , left ear and a "sizzling sound" for the past 2 weeks She continues to see mental health on a regular basis , and reports new visual hallucinations    Review of Systems Denies recent fever or chills. Denies sinus pressure, nasal congestion, or sore throat. Denies chest congestion, productive cough or wheezing. Denies chest pains, palpitations, paroxysmal nocturnal dyspnea, orthopnea and leg swelling Denies abdominal pain, nausea, vomiting,diarrhea or constipation.  Denies rectal bleeding or change in bowel movement. Denies dysuria, frequency, hesitancy or incontinence. Denies joint pain, swelling and limitation and mobility. Denies headaches, seizure, numbness, or tingling. Denies depression, anxiety or insomnia. Denies skin break down or rash.        Objective:   Physical Exam    Patient alert and oriented and in no Cardiopulmonary distress.  HEENT: No facial asymmetry, EOMI, no sinus tenderness, left TM occluded by cerumen, right is clear, Oropharynx pink and moist.  Neck supple no adenopathy.  Chest: Clear to auscultation bilaterally.  CVS: S1, S2 no murmurs, no S3.  ABD: Soft non tender. Bowel sounds normal.  Ext: No edema  MS: Adequate ROM spine, shoulders, hips and knees.  Skin: Intact, no ulcerations or rash noted.  Psych: Good eye contact, normal affect. Memory intact not anxious or depressed appearing.  CNS: CN  2-12 intact, power, tone and sensation normal throughout.     Assessment & Plan:  1. Left cerumen impaction, succesfully irrigated at OV 2. 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

## 2011-04-24 ENCOUNTER — Encounter (INDEPENDENT_AMBULATORY_CARE_PROVIDER_SITE_OTHER): Payer: 59 | Admitting: Psychiatry

## 2011-04-24 DIAGNOSIS — F331 Major depressive disorder, recurrent, moderate: Secondary | ICD-10-CM

## 2011-05-07 ENCOUNTER — Encounter (INDEPENDENT_AMBULATORY_CARE_PROVIDER_SITE_OTHER): Payer: 59 | Admitting: Psychiatry

## 2011-05-07 DIAGNOSIS — F322 Major depressive disorder, single episode, severe without psychotic features: Secondary | ICD-10-CM

## 2011-05-15 NOTE — Op Note (Signed)
Megan Gibbs, Megan Gibbs                 ACCOUNT NO.:  [id number]   MEDICAL RECORD NO.:  [id number]          PATIENT TYPE:  OIB   LOCATION:  3015                         FACILITY:  [facility]   PHYSICIAN:  Hilda Lias, M.D.   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   PROCEDURE:  Left L5-S1 diskectomy, foraminotomy, decompression of the S1  nerve root.  Microscope.   SURGEON:  Hilda Lias, M.D.   ASSISTANT:  Payton Doughty, M.D.   MEDICAL HISTORY:  [name] is a 60 year old female complaining of back  pain with radiation to the left leg.  This lady, many years ago,  underwent surgery at the level of 5-1 on the right side.  She had failed  with conservative treatment including epidural injection.  MRI x-ray  showed that she has a herniated disk at the level L5-1 to the left.  Surgery was advised.   PROCEDURE:  The patient was taken to the operating room and she was  positioned in prone manner.  X-rays showed that we were at the level of  5-1.  From then on, the skin was opened in the midline all the way down  to the area of 5-1.  With the microscope and the drill, we drilled the  lower lamina of L5, the upper of S1.  The yellow ligament was also  excised.  We found immediately that there was quite a bit of scar  tissue.  There was a herniated disk right at the takeoff of the S1 nerve  root.  Incision was made and large amount of degenerative disk medial  and lateral was done.  At the end, we had good decompression of the S1  nerve root all the way to the ganglion.  Then Valsalva maneuver was  negative.  Fentanyl and Depo-Medrol were left in the epidural space and  the  wound was closed with Vicryl and Steri-Strips.           ______________________________  Hilda Lias, M.D.     EB/MEDQ  D:  04/06/2008  T:  04/07/2008  Job:  [id number]

## 2011-05-18 NOTE — H&P (Signed)
NAME:  Megan, Gibbs NO.:  [id number]   MEDICAL RECORD NO.:  [id number]                  PATIENT TYPE:   LOCATION:                                       FACILITY:   PHYSICIAN:  Megan Gibbs, M.D.              DATE OF BIRTH:  December 06, 1951   DATE OF ADMISSION:  DATE OF DISCHARGE:                                HISTORY & PHYSICAL   REQUESTING PHYSICIAN:  Megan Gibbs, M.D.   CHIEF COMPLAINT:  Gastroesophageal reflux disease, atypical chest pain, and  odynophagia.   HISTORY OF PRESENT ILLNESS:  [name] is a 60 year old African-  American female who presents to our office for evaluation of severe  odynophagia which she describes as retrosternal scraping with spicy or  greasy foods.  She does have a history of GERD, and has been treated with  PPI since November 2003.  Currently, she has been on Prevacid since July  2004, or five months.  She does report minimal relief with the Prevacid, and  she is also using Carafate on a p.Megann. basis with some relief.  Heartburn  and indigestion is somewhat resolved.  She does continue to have occasional  regurgitation of foods.  She denies any nausea or vomiting.  She reports her  bowel movements are normal, soft and brown, usually on a daily basis or  every other day.  When she becomes constipated she increases her dietary  fiber with good relief.  She denies any melena or hematochezia.  Appetite is  decreased due to severe retrosternal pain.  She describes an approximately  seven to eight pound weight loss over the last couple of months, however,  she attributes this to decreased caloric intake secondary to working second  shift.   PAST MEDICAL HISTORY:  1. GERD.  2. Seasonal allergies.   PAST SURGICAL HISTORY:  1. Tubal ligation in 1976.  2. Herniated disk repair in 1992.   MEDICATIONS:  1. Prevacid 30 mg daily.  2. Carafate 1 g per 10 ml two teaspoons t.i.d.  3. Eldertonic  ____________.   ALLERGIES:  No known drug allergies.   FAMILY HISTORY:  No known family history of colorectal carcinoma, liver  carcinoma, or liver problems.  There is a strong family history of GERD with  a sister who recently had a Nissen fundoplication.  Mother is age 73 with  diabetes mellitus and hypothyroidism.  Father is deceased in his 9s of  unknown cause.  She does have five sisters and three brothers with negative  GI history except for GERD.   SOCIAL HISTORY:  [name] has been married for 14 years and has three  grown daughters who are relatively healthy except for hypertension and  asthma.  She is employed full-time at _________ [employer], second shift.  She denies any tobacco or alcohol use.  She does report a remote history of  using marijuana  as a teenager, however, she denies any IV drug use.   REVIEW OF SYSTEMS:  CONSTITUTIONAL:  See HPI.  Denies any fatigue, denies  any fever or chills.  ENDOCRINE:  Denies any history of thyroid problems or  diabetes mellitus.  GENITOURINARY:  Denies any dysuria, hematuria, or  urinary frequency.  GASTROINTESTINAL:  See HPI.  GYNECOLOGIC:  Does have a  history of abnormal Pap smear which she saw Dr. Emelda Gibbs.  Denies any  history of cervical carcinoma.   PHYSICAL EXAMINATION:  VITAL SIGNS:  Weight 130 pounds, height 61 inches,  temperature 97.7, blood pressure 130/80, pulse 72.  GENERAL:  [name] is a 60 year old African-American female who is  thin.  She appears alert, oriented, pleasant, and cooperative.  HEENT:  Sclerae clear, nonicteric, conjunctivae pink.  Oropharynx pink and  moist without any lesions.  NECK:  Supple without any mass or thyromegaly.  HEART:  Regular rate and rhythm without murmurs, rubs, or gallops.  LUNGS:  Clear to auscultation bilaterally.  ABDOMEN:  Flat and positive bowel sounds x4, soft, nontender, nondistended,  no palpable mass or organomegaly.  EXTREMITIES:  2+ pedal pulses  bilaterally.  No edema.  SKIN:  Warm and dry without any rash or jaundice.  RECTAL:  She did have a few very small external hemorrhoids that do not  appear edematous or thrombosed.  Good sphincter tone.  A small amount of  light brown Hemoccult negative stool was obtained.   ASSESSMENT:  1. [name] is a 60 year old African-American female with an over one     year history of gastroesophageal reflux disease symptoms, now associated     with increasing odynophagia and retrosternal atypical chest pain which is     exacerbated by certain foods:  At this point in time, I feel that we     should proceed with esophagogastroduodenoscopy to rule out erosive     esophagitis, changes of Barrett's esophagus, or any changes of     uncontrolled gastroesophageal reflux disease, such as esophageal     carcinoma.  2. Weight loss.  Although this appears to be related to Megan Gibbs's     decreased caloric intake due to stress and her change in work schedule,     we will check labs today to rule out hyperthyroidism.   RECOMMENDATIONS:  1. I have discussed esophagogastroduodenoscopy as well as screening     colonoscopy with Megan Gibbs.  This discussion included risks and benefits     which include, but are not limited too bleeding, perforation, and     infection.  She agrees with this plan.  2. She is to continue her Prevacid until procedures.  We may to consider     double this agent or using another proton pump inhibitor post procedure.  3. CBC and TSH today.  4. Further recommendations pending esophagogastroduodenoscopy and screening     colonoscopy by Dr. Jena Gibbs.   We would like to thank Dr. Felecia Gibbs for allowing [place] to participate in the care  of Megan Gibbs.     _____________________________________  ___________________________________________  Megan Gibbs, N.P.               Megan Gibbs, M.D.   KC/MEDQ  D:  12/28/2003  T:  12/28/2003  Job:  [id number]  cc:   Megan Gibbs,  M.D.  [address]  [place]  [place] [address]  Fax: [phone]

## 2011-05-18 NOTE — Op Note (Signed)
NAME:  Megan Gibbs, Megan Gibbs                           ACCOUNT NO.:  [id number]   MEDICAL RECORD NO.:  [id number]                   PATIENT TYPE:  AMB   LOCATION:  DAY                                  FACILITY:  [facility]   PHYSICIAN:  R. Roetta Sessions, M.D.              DATE OF BIRTH:  [birth year]   DATE OF PROCEDURE:  01/28/2004  DATE OF DISCHARGE:                                 OPERATIVE REPORT   PROCEDURE:  Esophagogastroduodenoscopy followed by screening colonoscopy.   INDICATIONS:  The patient is a 60 year old lady with long history of  gastroesophageal reflux disease, now with some intermittent odynophagia and  retrosternal atypical chest discomfort.  She is here for screening  colonoscopy as well.  She has had a suboptimal response to Prevacid.  The  approach of EGD and colonoscopy have been discussed with the patient  previously and again at the bedside.  The potential risks, benefits and  alternatives have been reviewed, questions answered.  She is agreeable.  Please see my dictated H&P for more information.   DESCRIPTION OF PROCEDURE:  Oxygen saturation, blood pressure, pulse and  respiration were monitored throughout the entire procedure.  Conscious  sedation with Versed 3 mg IV, Demerol 50 mg IV in divided doses.  The  instrument was the Olympus video tip adult gastroscope and pediatric  colonoscope.   EGD FINDINGS:  Esophagus:  Examination of the tubular esophagus revealed no  mucosal abnormalities.  The EG junction was easily traversed.  Stomach:  The gastric cavity was empty and insufflated well with air.  A  thorough examination of the gastric mucosa including the retroflexed view of  the proximal stomach and esophagogastric junction demonstrated no  abnormalities.  Pylorus was patent and easily traversed.  Examination of the  bulb and second portion revealed no abnormalities.   THERAPY AND DIAGNOSTIC MANEUVERS:  None.   The patient tolerated the procedure well and was  prepared for colonoscopy.  Digital rectal exam revealed no abnormalities.   COLONOSCOPY FINDINGS:  Prep was good.  Rectum:  Examination of the rectal mucosa including retroflexion in the anal  verge revealed no abnormalities.  Colon:  Colonic mucosa was surveyed from the rectosigmoid junction through  the left, transverse,  right colon to the area of the appendiceal orifice,  ileocecal valve and cecum.  These structures were well seen and  photographed.  From this level the scope was slowly withdrawn.  All  previously mentioned mucosal surfaces were once again seen.  The colonic  mucosa were once again entirely normal.  The patient tolerated the procedure  well and was reactive after endoscopy.   IMPRESSION:  1. Esophagogastroduodenoscopy with normal esophagus, stomach, D1 and D2.  2. Colonoscopy findings show normal rectum, normal colon.   RECOMMENDATIONS:  1. As far as colorectal cancer screening goes, she should have repeat     colonoscopy in 10 years.  2. We need  to make sure she does not have occult gallbladder disease.  Will     proceed with a gallbladder ultrasound with additional labs including LFTs     and amylase.  CBC through my office shows white count 4.5, hemoglobin     11.7, hematocrit 36, MCV 80.6, platelet count 236,000.  TSH 1.497.  3. Further recommendations once labs and ultrasound results are available     for review.      ___________________________________________                                            Jonathon Bellows, M.D.   RMR/MEDQ  D:  01/28/2004  T:  01/28/2004  Job:  [id number]   cc:   Megan Gibbs, M.D.  [address]  [place]  [place] [address]  Fax: [phone]

## 2011-06-01 ENCOUNTER — Encounter (INDEPENDENT_AMBULATORY_CARE_PROVIDER_SITE_OTHER): Payer: 59 | Admitting: Psychiatry

## 2011-06-01 DIAGNOSIS — F322 Major depressive disorder, single episode, severe without psychotic features: Secondary | ICD-10-CM

## 2011-06-04 ENCOUNTER — Encounter (HOSPITAL_COMMUNITY): Payer: 59 | Admitting: Psychiatry

## 2011-06-05 ENCOUNTER — Encounter (HOSPITAL_COMMUNITY): Payer: 59 | Admitting: Psychiatry

## 2011-06-12 ENCOUNTER — Encounter: Payer: Self-pay | Admitting: Family Medicine

## 2011-06-12 ENCOUNTER — Ambulatory Visit (INDEPENDENT_AMBULATORY_CARE_PROVIDER_SITE_OTHER): Payer: Medicare Other | Admitting: Family Medicine

## 2011-06-12 VITALS — BP 120/70 | HR 70 | Resp 16 | Ht 62.0 in | Wt 114.0 lb

## 2011-06-12 DIAGNOSIS — F411 Generalized anxiety disorder: Secondary | ICD-10-CM

## 2011-06-12 DIAGNOSIS — R7301 Impaired fasting glucose: Secondary | ICD-10-CM

## 2011-06-12 DIAGNOSIS — J309 Allergic rhinitis, unspecified: Secondary | ICD-10-CM

## 2011-06-12 DIAGNOSIS — J209 Acute bronchitis, unspecified: Secondary | ICD-10-CM

## 2011-06-12 DIAGNOSIS — F329 Major depressive disorder, single episode, unspecified: Secondary | ICD-10-CM

## 2011-06-12 DIAGNOSIS — Z79899 Other long term (current) drug therapy: Secondary | ICD-10-CM

## 2011-06-12 MED ORDER — PREDNISONE (PAK) 5 MG PO TABS: 5.0000 mg | ORAL_TABLET | ORAL | Status: AC

## 2011-06-12 MED ORDER — FLUTICASONE PROPIONATE 50 MCG/ACT NA SUSP: 1.0000 | Freq: Every day | NASAL | Status: DC

## 2011-06-12 MED ORDER — BENZONATATE 100 MG PO CAPS: 100.0000 mg | ORAL_CAPSULE | Freq: Three times a day (TID) | ORAL | Status: DC | PRN

## 2011-06-12 MED ORDER — AZITHROMYCIN 250 MG PO TABS: ORAL_TABLET | ORAL | Status: AC

## 2011-06-12 NOTE — Assessment & Plan Note (Signed)
Well controlled on medication.

## 2011-06-12 NOTE — Patient Instructions (Addendum)
F/u as before  You are being treated for acute bronchitis and uncontrolled allergies, med has been sent to your pharmacy  pls follow a low fat diet, watch your sweets also.  Fasting lipid and blood sugar just before next visit in September.

## 2011-06-12 NOTE — Assessment & Plan Note (Signed)
Uncontrolled, flonase prescribed and prednisone dose pack also

## 2011-06-12 NOTE — Progress Notes (Signed)
  Subjective:    Patient ID: Megan Gibbs, female    DOB: 02/28/1951, 60 y.o.   MRN: [id number]  HPI 5 day h/o chest congestion with non productive cough. She has also noted increased post nasal drainage, and has had intermittent chills but no documented fever. Prior to this she has been well, stable and controled on antipsychotic meds, and she follows regularly with psychiatry. Although aware of hyperlipidemia diagnosis , she eats a lot of ice cream and cookies, I advised against this and re-educated her   Review of Systems Denies chest pains, palpitations, paroxysmal nocturnal dyspnea, orthopnea and leg swelling Denies abdominal pain, nausea, vomiting,diarrhea or constipation.  Denies rectal bleeding or change in bowel movement. Denies dysuria, frequency, hesitancy or incontinence. Denies joint pain, swelling and limitation in mobility. Denies headaches, seizure, numbness, or tingling. Denies depression, anxiety or insomnia. Denies skin break down or rash.        Objective:   Physical Exam    Patient alert and oriented and in no Cardiopulmonary distress.  HEENT: No facial asymmetry, EOMI, no sinus tenderness, TM's clear, Oropharynx pink and moist.  Neck supple no adenopathy.Erythema and edema of nasal mucosa  Chest: decreased air entry, bibasilar cracklles  CVS: S1, S2 no murmurs, no S3.  ABD: Soft non tender. Bowel sounds normal.  Ext: No edema  MS: Adequate ROM spine, shoulders, hips and knees.  Skin: Intact, no ulcerations or rash noted.  Psych: Good eye contact, normal affect. Memory intact not anxious or depressed appearing.  CNS: CN 2-12 intact, power, tone and sensation normal throughout.     Assessment & Plan:

## 2011-06-12 NOTE — Assessment & Plan Note (Signed)
Acute episode, antibiotic, decongestants and prednisone prescribed

## 2011-06-12 NOTE — Assessment & Plan Note (Signed)
Resolved , pt to continue med per psychiatry

## 2011-06-19 ENCOUNTER — Encounter (INDEPENDENT_AMBULATORY_CARE_PROVIDER_SITE_OTHER): Payer: Medicare Other | Admitting: Psychiatry

## 2011-06-19 DIAGNOSIS — F331 Major depressive disorder, recurrent, moderate: Secondary | ICD-10-CM

## 2011-06-22 ENCOUNTER — Telehealth: Payer: Self-pay | Admitting: Family Medicine

## 2011-06-22 ENCOUNTER — Encounter (INDEPENDENT_AMBULATORY_CARE_PROVIDER_SITE_OTHER): Payer: Medicare Other | Admitting: Psychiatry

## 2011-06-22 DIAGNOSIS — F322 Major depressive disorder, single episode, severe without psychotic features: Secondary | ICD-10-CM

## 2011-06-22 DIAGNOSIS — F411 Generalized anxiety disorder: Secondary | ICD-10-CM

## 2011-06-22 MED ORDER — PREDNISONE 5 MG PO KIT: 1.0000 | PACK | ORAL | Status: DC

## 2011-06-22 NOTE — Telephone Encounter (Signed)
pls advise and erx prednisone 5mg  dose pack x 6 days (#21 tabs)

## 2011-06-22 NOTE — Telephone Encounter (Signed)
Complains of dry cough, and drainage still going into chest, denies chills, fever, or body aches. Medication from ov not helping.

## 2011-06-22 NOTE — Telephone Encounter (Signed)
Med sent, called patient, no answer 

## 2011-06-25 NOTE — Telephone Encounter (Signed)
Called patient, no answer 

## 2011-06-26 NOTE — Telephone Encounter (Signed)
Patient aware.

## 2011-07-13 ENCOUNTER — Encounter (INDEPENDENT_AMBULATORY_CARE_PROVIDER_SITE_OTHER): Payer: Medicare Other | Admitting: Psychiatry

## 2011-07-13 DIAGNOSIS — F411 Generalized anxiety disorder: Secondary | ICD-10-CM

## 2011-07-13 DIAGNOSIS — F331 Major depressive disorder, recurrent, moderate: Secondary | ICD-10-CM

## 2011-08-03 ENCOUNTER — Encounter (INDEPENDENT_AMBULATORY_CARE_PROVIDER_SITE_OTHER): Payer: Medicare Other | Admitting: Psychiatry

## 2011-08-03 DIAGNOSIS — F411 Generalized anxiety disorder: Secondary | ICD-10-CM

## 2011-08-03 DIAGNOSIS — F331 Major depressive disorder, recurrent, moderate: Secondary | ICD-10-CM

## 2011-08-14 ENCOUNTER — Encounter (INDEPENDENT_AMBULATORY_CARE_PROVIDER_SITE_OTHER): Payer: Medicare Other | Admitting: Psychiatry

## 2011-08-14 DIAGNOSIS — F331 Major depressive disorder, recurrent, moderate: Secondary | ICD-10-CM

## 2011-08-16 ENCOUNTER — Other Ambulatory Visit: Payer: Self-pay | Admitting: Family Medicine

## 2011-08-16 DIAGNOSIS — Z139 Encounter for screening, unspecified: Secondary | ICD-10-CM

## 2011-08-21 ENCOUNTER — Ambulatory Visit (HOSPITAL_COMMUNITY)
Admission: RE | Admit: 2011-08-21 | Discharge: 2011-08-21 | Disposition: A | Discharge status: Home / Self Care | Payer: Medicare Other | Source: Ambulatory Visit | Attending: Family Medicine | Admitting: Family Medicine

## 2011-08-21 DIAGNOSIS — Z139 Encounter for screening, unspecified: Secondary | ICD-10-CM

## 2011-08-21 DIAGNOSIS — Z1231 Encounter for screening mammogram for malignant neoplasm of breast: Secondary | ICD-10-CM | POA: Insufficient documentation

## 2011-08-31 ENCOUNTER — Encounter (INDEPENDENT_AMBULATORY_CARE_PROVIDER_SITE_OTHER): Payer: Medicare Other | Admitting: Psychiatry

## 2011-08-31 DIAGNOSIS — F411 Generalized anxiety disorder: Secondary | ICD-10-CM

## 2011-08-31 DIAGNOSIS — F331 Major depressive disorder, recurrent, moderate: Secondary | ICD-10-CM

## 2011-09-10 ENCOUNTER — Other Ambulatory Visit: Payer: Self-pay | Admitting: Family Medicine

## 2011-09-14 ENCOUNTER — Encounter: Payer: Self-pay | Admitting: Family Medicine

## 2011-09-17 ENCOUNTER — Ambulatory Visit (INDEPENDENT_AMBULATORY_CARE_PROVIDER_SITE_OTHER): Payer: Medicare Other | Admitting: Family Medicine

## 2011-09-17 ENCOUNTER — Encounter: Payer: Self-pay | Admitting: Family Medicine

## 2011-09-17 VITALS — BP 108/70 | HR 60 | Resp 16 | Ht 62.0 in | Wt 114.8 lb

## 2011-09-17 DIAGNOSIS — F419 Anxiety disorder, unspecified: Secondary | ICD-10-CM

## 2011-09-17 DIAGNOSIS — F3289 Other specified depressive episodes: Secondary | ICD-10-CM

## 2011-09-17 DIAGNOSIS — F411 Generalized anxiety disorder: Secondary | ICD-10-CM

## 2011-09-17 DIAGNOSIS — F329 Major depressive disorder, single episode, unspecified: Secondary | ICD-10-CM

## 2011-09-17 DIAGNOSIS — Z23 Encounter for immunization: Secondary | ICD-10-CM

## 2011-09-17 MED ORDER — INFLUENZA VAC TYPES A & B PF IM SUSP: 0.5000 mL | Freq: Once | INTRAMUSCULAR | Status: DC

## 2011-09-17 NOTE — Patient Instructions (Signed)
F/u in 4.5 months.  I will wait on recent labs done before ordering any new labs.  Flu vaccine today.  Check with your insurance co re shingles vaccine.  Call if you need me

## 2011-09-17 NOTE — Assessment & Plan Note (Signed)
stable °

## 2011-09-17 NOTE — Assessment & Plan Note (Signed)
Improved pt to continue med  Through psych

## 2011-09-17 NOTE — Progress Notes (Signed)
  Subjective:    Patient ID: Megan Gibbs, female    DOB: May 15, 1951, 60 y.o.   MRN: [id number]  HPI  The PT is here for follow up and re-evaluation of chronic medical conditions, medication management and review of any available recent lab and radiology data.  Preventive health is updated, specifically  Cancer screening and Immunization.   Questions or concerns regarding consultations or procedures which the PT has had in the interim are  addressed. The PT denies any adverse reactions to current medications since the last visit.  There are no new concerns.  There are no specific complaints     Review of Systems Denies recent fever or chills. Denies sinus pressure, nasal congestion, ear pain or sore throat. Denies chest congestion, productive cough or wheezing. Denies chest pains, palpitations and leg swelling Denies abdominal pain, nausea, vomiting,diarrhea or constipation.   Denies dysuria, frequency, hesitancy or incontinence. Denies joint pain, swelling and limitation in mobility. Denies headaches, seizures, numbness, or tingling. Denies depression, anxiety or insomnia. Denies skin break down or rash.        Objective:   Physical Exam Patient alert and oriented and in no cardiopulmonary distress.  HEENT: No facial asymmetry, EOMI, no sinus tenderness,  oropharynx pink and moist.  Neck supple no adenopathy.  Chest: Clear to auscultation bilaterally.  CVS: S1, S2 no murmurs, no S3.  ABD: Soft non tender. Bowel sounds normal.  Ext: No edema  MS: Adequate ROM spine, shoulders, hips and knees.  Skin: Intact, no ulcerations or rash noted.  Psych: Good eye contact, normal affect. Memory intact not anxious or depressed appearing.  CNS: CN 2-12 intact, power, tone and sensation normal throughout.        Assessment & Plan:

## 2011-09-20 ENCOUNTER — Ambulatory Visit (INDEPENDENT_AMBULATORY_CARE_PROVIDER_SITE_OTHER): Payer: Medicare Other | Admitting: *Deleted

## 2011-09-20 DIAGNOSIS — Z23 Encounter for immunization: Secondary | ICD-10-CM

## 2011-09-20 DIAGNOSIS — Z2911 Encounter for prophylactic immunotherapy for respiratory syncytial virus (RSV): Secondary | ICD-10-CM

## 2011-09-24 LAB — BASIC METABOLIC PANEL
CO2: 24
Chloride: 107
Creatinine, Ser: 0.64
GFR calc Af Amer: >60
Potassium: 3.3 — ABNORMAL LOW
Sodium: 137

## 2011-09-24 LAB — URINALYSIS, ROUTINE W REFLEX MICROSCOPIC
Bilirubin Urine: NEGATIVE
Glucose, UA: NEGATIVE
Hgb urine dipstick: NEGATIVE
Ketones, ur: NEGATIVE
Protein, ur: NEGATIVE
pH: 6

## 2011-09-24 LAB — URINE MICROSCOPIC-ADD ON

## 2011-09-25 LAB — CBC
HCT: 35.9 — ABNORMAL LOW
Hemoglobin: 11.9 — ABNORMAL LOW
MCHC: 33.3
MCV: 80.8
RBC: 4.44
WBC: 5.7

## 2011-09-28 ENCOUNTER — Encounter (INDEPENDENT_AMBULATORY_CARE_PROVIDER_SITE_OTHER): Payer: Medicare Other | Admitting: Psychiatry

## 2011-09-28 DIAGNOSIS — F411 Generalized anxiety disorder: Secondary | ICD-10-CM

## 2011-09-28 DIAGNOSIS — F322 Major depressive disorder, single episode, severe without psychotic features: Secondary | ICD-10-CM

## 2011-10-16 ENCOUNTER — Encounter (INDEPENDENT_AMBULATORY_CARE_PROVIDER_SITE_OTHER): Payer: Medicare Other | Admitting: Psychiatry

## 2011-10-16 DIAGNOSIS — F331 Major depressive disorder, recurrent, moderate: Secondary | ICD-10-CM

## 2011-10-26 ENCOUNTER — Encounter (INDEPENDENT_AMBULATORY_CARE_PROVIDER_SITE_OTHER): Payer: Medicare Other | Admitting: Psychiatry

## 2011-10-26 DIAGNOSIS — F411 Generalized anxiety disorder: Secondary | ICD-10-CM

## 2011-10-26 DIAGNOSIS — F332 Major depressive disorder, recurrent severe without psychotic features: Secondary | ICD-10-CM

## 2011-11-21 ENCOUNTER — Ambulatory Visit (INDEPENDENT_AMBULATORY_CARE_PROVIDER_SITE_OTHER): Payer: Medicare Other | Admitting: Psychiatry

## 2011-11-21 ENCOUNTER — Encounter (HOSPITAL_COMMUNITY): Payer: Self-pay | Admitting: Psychiatry

## 2011-11-21 DIAGNOSIS — F419 Anxiety disorder, unspecified: Secondary | ICD-10-CM

## 2011-11-21 DIAGNOSIS — F411 Generalized anxiety disorder: Secondary | ICD-10-CM

## 2011-11-21 DIAGNOSIS — F331 Major depressive disorder, recurrent, moderate: Secondary | ICD-10-CM

## 2011-11-21 NOTE — Progress Notes (Addendum)
Patient:  Megan Gibbs   DOB: [birth year]  MR Number: [id number]  Location: [facility]:  [address] [place] [place],  [place], [address]  Start: Wednesday 11/21/2011 9:30 AM End: Wednesday 11/21/2011 10:30 AM  Provider/Observer:     Florencia Reasons, MSW, LCSW   Chief Complaint:      Chief Complaint  Patient presents with  . Anxiety  . Depression    Reason For Service:     The patient was referred for services by primary care physician Dr. Cletus Gash due to the patient experiencing symptoms of depression with intermittent suicidal ideation.  Interventions Strategy:  Supportive therapy, cognitive behavioral therapy  Participation Level:   Active  Participation Quality:  Appropriate      Behavioral Observation:  Fairly Groomed, Alert, and anxious   Current Psychosocial Factors: The patient reports continued stress related to living alone.  Content of Session:   Reviewing symptoms, identifying stressors, identifying ways to increase physical activity, identifying coping and relaxation techniques, identifying thought patterns and effects mood and behavior  Current Status:   The patient reports decreased panic attacks but continued nervousness in the evenings. She also reports continued sadness related to the transition of the seasons.  Patient Progress:   Fair. Patient reports positive sleep patterns and increased social involvement including going various places with her sister and attending church regularly. She is pleased that she has decreased panic attacks but still reports sadness and feeling nervous especially at night. Therapist and patient discuss increasing light exposure as well as ways to use support system. Therapist and patient also identify patient's thought patterns and ways to refrain negative thought patterns. Patient is pleased that she has been assertive with her ex- husband. She reports setting boundaries with him regarding visits. She reports husband has  stopped coming to her home unannounced and has not come to her home as frequently.  Target Goals:   1. Resume normal interest in activities. 2. Improved mood, decrease anxiety, and decrease panic attacks. 3. Decrease negative thought patterns.  Last Reviewed:   05/07/2011  Goals Addressed Today:    Improve mood, decrease anxiety, and decrease negative thoughts  Impression/Diagnosis:   The patient reports experiencing symptoms of anxiety and depression since February 2010. Symptoms appear to have been triggered by patient's losing her job  and and back surgery in 2009. Patient has continued to experience recurrent periods of depression and ongoing anxiety.  Diagnosis:  Axis I:  1. Major depressive disorder, recurrent episode, moderate   2. Anxiety disorder             Axis II: Deferred

## 2011-11-21 NOTE — Patient Instructions (Signed)
Improve self-care regarding physical activities, continue to increase light exposure( natural and artificial, use the relaxation breathing

## 2011-12-08 ENCOUNTER — Other Ambulatory Visit (HOSPITAL_COMMUNITY): Payer: Self-pay | Admitting: Psychiatry

## 2011-12-11 ENCOUNTER — Encounter (HOSPITAL_COMMUNITY): Payer: Medicare Other | Admitting: Psychiatry

## 2011-12-11 ENCOUNTER — Ambulatory Visit (INDEPENDENT_AMBULATORY_CARE_PROVIDER_SITE_OTHER): Payer: Medicare Other | Admitting: Psychiatry

## 2011-12-11 DIAGNOSIS — F329 Major depressive disorder, single episode, unspecified: Secondary | ICD-10-CM

## 2011-12-11 MED ORDER — FLUOXETINE HCL 40 MG PO CAPS: 40.0000 mg | ORAL_CAPSULE | Freq: Every day | ORAL | Status: DC

## 2011-12-11 MED ORDER — HALOPERIDOL 2 MG PO TABS: 2.0000 mg | ORAL_TABLET | Freq: Every day | ORAL | Status: DC

## 2011-12-11 MED ORDER — BENZTROPINE MESYLATE 0.5 MG PO TABS: 0.5000 mg | ORAL_TABLET | Freq: Every day | ORAL | Status: DC

## 2011-12-11 NOTE — Progress Notes (Signed)
Patient came for her followup appointment. She continued to endorse nervousness and negative thoughts at night. She is sleeping better but continued to endorse depressive thoughts. Her last blood work shows high hemoglobin A1c however she has not seen her primary care physician yet. She is scheduled to see Dr. Lodema Hong in few weeks. She denies any agitation anger or mood swings but endorse social isolation nervousness and racing thoughts at night. We had tried Zyprexa which works good in a higher dose but patient started to show high blood sugar. I talked to her to switching to Haldol to see if that helps her depressive symptoms and may reduce her blood sugar. Patient agreed with the plan.  Mental status examination Patient is casually dressed and maintained a fair eye contact. Her speech is slow but soft and clear. Her attention and concentration is fair. She denies any active or passive suicidal thoughts or homicidal thoughts. She has poverty of thought content but there were no delusions or obsessions present. She described her mood is depressed and anxious and her affect is constricted. She's alert and her yet at x3 and her insight judgment and impulse control is okay.  Assessment Maj. depressive disorder with psychotic features  Plan I will discontinue her Zyprexa which is causing high blood sugar. We will try Haldol 2 mg at bedtime. I will continue her Cogentin and Prozac. I explained risks and benefits of medication. I recommended to call us if she has any question concerns or side effects of medication. She'll continue to see therapist regularly. I will see her again in 4-5 weeks to see the response of Haldol.

## 2011-12-12 ENCOUNTER — Encounter: Payer: Self-pay | Admitting: Family Medicine

## 2011-12-18 ENCOUNTER — Encounter: Payer: Self-pay | Admitting: Family Medicine

## 2011-12-18 ENCOUNTER — Ambulatory Visit (INDEPENDENT_AMBULATORY_CARE_PROVIDER_SITE_OTHER): Payer: Medicare Other | Admitting: Family Medicine

## 2011-12-18 ENCOUNTER — Other Ambulatory Visit (HOSPITAL_COMMUNITY)
Admission: RE | Admit: 2011-12-18 | Discharge: 2011-12-18 | Disposition: A | Discharge status: Home / Self Care | Payer: Medicare Other | Source: Ambulatory Visit | Attending: Family Medicine | Admitting: Family Medicine

## 2011-12-18 ENCOUNTER — Other Ambulatory Visit: Payer: Self-pay | Admitting: Family Medicine

## 2011-12-18 VITALS — BP 104/70 | HR 79 | Resp 16 | Ht 62.0 in | Wt 112.1 lb

## 2011-12-18 DIAGNOSIS — Z0000 Encounter for general adult medical examination without abnormal findings: Secondary | ICD-10-CM

## 2011-12-18 DIAGNOSIS — Z01419 Encounter for gynecological examination (general) (routine) without abnormal findings: Secondary | ICD-10-CM | POA: Insufficient documentation

## 2011-12-18 DIAGNOSIS — M25519 Pain in unspecified shoulder: Secondary | ICD-10-CM

## 2011-12-18 DIAGNOSIS — F411 Generalized anxiety disorder: Secondary | ICD-10-CM

## 2011-12-18 DIAGNOSIS — R1013 Epigastric pain: Secondary | ICD-10-CM | POA: Insufficient documentation

## 2011-12-18 DIAGNOSIS — F329 Major depressive disorder, single episode, unspecified: Secondary | ICD-10-CM

## 2011-12-18 LAB — CBC
HCT: 34.3 % — ABNORMAL LOW (ref 36.0–46.0)
MCH: 26.3 pg (ref 26.0–34.0)
MCV: 81.9 fL (ref 78.0–100.0)
Platelets: 273 10*3/uL (ref 150–400)
RDW: 15.2 % (ref 11.5–15.5)
WBC: 4 10*3/uL (ref 4.0–10.5)

## 2011-12-18 LAB — COMPREHENSIVE METABOLIC PANEL
Albumin: 4.4 g/dL (ref 3.5–5.2)
Alkaline Phosphatase: 80 U/L (ref 39–117)
BUN: 12 mg/dL (ref 6–23)
Calcium: 9 mg/dL (ref 8.4–10.5)
Creat: 0.61 mg/dL (ref 0.50–1.10)
Glucose, Bld: 87 mg/dL (ref 70–99)
Potassium: 4.2 mEq/L (ref 3.5–5.3)

## 2011-12-18 LAB — POC HEMOCCULT BLD/STL (OFFICE/1-CARD/DIAGNOSTIC): Fecal Occult Blood, POC: NEGATIVE

## 2011-12-18 MED ORDER — HYDROCODONE-ACETAMINOPHEN 7.5-325 MG PO TABS: 1.0000 | ORAL_TABLET | ORAL | Status: DC | PRN

## 2011-12-18 MED ORDER — HALOPERIDOL 2 MG PO TABS: 2.0000 mg | ORAL_TABLET | Freq: Every day | ORAL | Status: DC

## 2011-12-18 NOTE — Assessment & Plan Note (Signed)
Controlled, no change in medication  

## 2011-12-18 NOTE — Assessment & Plan Note (Signed)
improved and controlled continue with mental health

## 2011-12-18 NOTE — Patient Instructions (Addendum)
CPE in 12 months  Call if you need me before  Next colonoscopy due in 2015.  Next bone density end of 2013.  Continue meds as before  cBC , TSh and chem 7 and H pylori  today

## 2011-12-18 NOTE — Assessment & Plan Note (Signed)
Uses hydrocodone intermittently from dr Hilda Lias

## 2011-12-18 NOTE — Assessment & Plan Note (Signed)
Single episode of dyspepsia, will check H pylori

## 2011-12-19 ENCOUNTER — Ambulatory Visit (INDEPENDENT_AMBULATORY_CARE_PROVIDER_SITE_OTHER): Payer: Medicare Other | Admitting: Psychiatry

## 2011-12-19 ENCOUNTER — Other Ambulatory Visit (HOSPITAL_COMMUNITY): Payer: Self-pay | Admitting: *Deleted

## 2011-12-19 ENCOUNTER — Encounter (HOSPITAL_COMMUNITY): Payer: Self-pay | Admitting: Psychiatry

## 2011-12-19 DIAGNOSIS — F329 Major depressive disorder, single episode, unspecified: Secondary | ICD-10-CM

## 2011-12-19 DIAGNOSIS — F419 Anxiety disorder, unspecified: Secondary | ICD-10-CM

## 2011-12-19 DIAGNOSIS — F411 Generalized anxiety disorder: Secondary | ICD-10-CM

## 2011-12-19 DIAGNOSIS — F331 Major depressive disorder, recurrent, moderate: Secondary | ICD-10-CM

## 2011-12-19 LAB — IRON: Iron: 87 ug/dL (ref 42–145)

## 2011-12-19 LAB — VITAMIN B12: Vitamin B-12: 418 pg/mL (ref 211–911)

## 2011-12-19 NOTE — Progress Notes (Signed)
Subjective:     Patient ID: Megan Gibbs, female   DOB: October 18, 1951, 60 y.o.   MRN: [id number]  HPI The PT is here for annual exam and re-evaluation of chronic medical conditions, medication management and review of any available recent lab and radiology data.  Preventive health is updated, specifically  Cancer screening and Immunization.   Questions or concerns regarding consultations or procedures which the PT has had in the interim are  addressed. The PT denies any adverse reactions to current medications since the last visit.  There are no new concerns.  There are no specific complaints      Review of Systems See HPI Denies recent fever or chills. Denies sinus pressure, nasal congestion, ear pain or sore throat. Denies chest congestion, productive cough or wheezing. Denies chest pains, palpitations and leg swelling Denies abdominal pain, nausea, vomiting,diarrhea or constipation.   Denies dysuria, frequency, hesitancy or incontinence. Denies joint pain, swelling and limitation in mobility. Denies headaches, seizures, numbness, or tingling. Denies depression, anxiety or insomnia. Denies skin break down or rash.        Objective:   Physical Exam    Pleasant well nourished female, alert and oriented x 3, in no cardio-pulmonary distress. Afebrile. HEENT No facial trauma or asymetry. Sinuses non tender.  EOMI, PERTL, fundoscopic exam is normal, no hemorhage or exudate.  External ears normal, tympanic membranes clear. Oropharynx moist, no exudate, fair dentition. Neck: supple, no adenopathy,JVD or thyromegaly.No bruits.  Chest: Clear to ascultation bilaterally.No crackles or wheezes. Non tender to palpation  Breast: No asymetry,no masses. No nipple discharge or inversion. No axillary or supraclavicular adenopathy  Cardiovascular system; Heart sounds normal,  S1 and  S2 ,no S3.  No murmur, or thrill. Apical beat not displaced Peripheral pulses  normal.  Abdomen: Soft, non tender, no organomegaly or masses. No bruits. Bowel sounds normal. No guarding, tenderness or rebound.  Rectal:  No mass. Guaiac negative stool.  GU: External genitalia normal. No lesions. Vaginal canal normal.No discharge. Uterus normal size, no adnexal masses, no cervical motion or adnexal tenderness.  Musculoskeletal exam: Full ROM of spine, hips , shoulders and knees. No deformity ,swelling or crepitus noted. No muscle wasting or atrophy.   Neurologic: Cranial nerves 2 to 12 intact. Power, tone ,sensation and reflexes normal throughout. No disturbance in gait. No tremor.  Skin: Intact, no ulceration, erythema , scaling or rash noted. Pigmentation normal throughout  Psych; Normal mood and affect. Judgement and concentration normal  Assessment:        Plan:

## 2011-12-19 NOTE — Progress Notes (Signed)
Patient:  Megan Gibbs   DOB: 13-May-1951  MR Number: [id number]  Location: [facility]:  [address] [place] [place],  [place], [address]  Start: Wednesday 12/19/2011 9:00 AM End: Wednesday 12/19/2011 10:00 AM  Provider/Observer:     Florencia Reasons, MSW, LCSW   Chief Complaint:      Chief Complaint  Patient presents with  . Depression  . Anxiety    Reason For Service:     The patient was referred for services by primary care physician Dr. Cletus Gash due to the patient experiencing symptoms of depression with intermittent suicidal ideation.  Interventions Strategy:  Supportive therapy, cognitive behavioral therapy  Participation Level:   Active  Participation Quality:  Appropriate      Behavioral Observation:  Fairly Groomed, Alert, and anxious   Current Psychosocial Factors: The patient reports no specific stressors  Content of Session:   Reviewing symptoms, identifying ways to increase physical activity, identifying coping and relaxation techniques, identifying thought patterns and effects mood and behavior  Current Status:   The patient reports decreased panic attacks but continued nervousness in the evenings. She also reports continued sadness.  Patient Progress:   Fair. Patient reports continued social involvement including going various places with her sister and attending church regularly during weekends. However, she reports little to no involvement in activity during the week.  Per patient's report, this is when she becomes more sad and nervous. She reports mainly watching TV and lying in bed.  Therapist works with patient to develop a schedule to increase activity; increase social contact, and develop balance. Patient is looking forward to celebrating Christmas with her family  Target Goals:   1. Resume normal interest in activities. 2. Improved mood, decrease anxiety, and decrease panic attacks. 3. Decrease negative thought patterns.  Last  Reviewed:   05/07/2011  Goals Addressed Today:    Improve mood, decrease anxiety, and decrease negative thoughts  Impression/Diagnosis:   The patient reports experiencing symptoms of anxiety and depression since February 2010. Symptoms appear to have been triggered by patient's losing her job  and and back surgery in 2009. Patient has continued to experience recurrent periods of depression and ongoing anxiety.  Diagnosis:  Axis I:  1. Major depressive disorder, recurrent episode, moderate   2. Anxiety disorder             Axis II: Deferred

## 2011-12-19 NOTE — Patient Instructions (Signed)
Increase activity, develop and follow schedule, increase light exposure, improve sleep hygiene and eating habits

## 2012-01-16 ENCOUNTER — Ambulatory Visit (HOSPITAL_COMMUNITY): Payer: Medicare Other | Admitting: Psychiatry

## 2012-01-21 ENCOUNTER — Ambulatory Visit (INDEPENDENT_AMBULATORY_CARE_PROVIDER_SITE_OTHER): Payer: Medicare Other | Admitting: Psychiatry

## 2012-01-21 ENCOUNTER — Encounter (HOSPITAL_COMMUNITY): Payer: Self-pay | Admitting: Psychiatry

## 2012-01-21 DIAGNOSIS — F419 Anxiety disorder, unspecified: Secondary | ICD-10-CM

## 2012-01-21 DIAGNOSIS — F331 Major depressive disorder, recurrent, moderate: Secondary | ICD-10-CM

## 2012-01-21 DIAGNOSIS — F411 Generalized anxiety disorder: Secondary | ICD-10-CM

## 2012-01-21 NOTE — Progress Notes (Signed)
Patient:  Megan Gibbs   DOB: 1951/09/13  MR Number: [id number]  Location: [facility]:  [address] [place],  [place], [address]  Start: Monday 01/21/2012 9:30 AM End: Monday 01/21/2012 10:15 AM  Provider/Observer:     Florencia Reasons, MSW, LCSW   Chief Complaint:      Chief Complaint  Patient presents with  . Depression  . Anxiety    Reason For Service:     The patient was referred for services by primary care physician Dr. Cletus Gash due to the patient experiencing symptoms of depression with intermittent suicidal ideation.  Interventions Strategy:  Supportive therapy, cognitive behavioral therapy  Participation Level:   Active  Participation Quality:  Appropriate      Behavioral Observation:  Fairly Groomed, Alert, and anxious   Current Psychosocial Factors: The patient expresses worry about her adult daughter who has been borrowing money from patient. She also expresses fear that her daughter may be using drugs as she is living with her boyfriend who is known to have drug abuse/dependence issues per patient's report.  Content of Session:   Reviewing symptoms, identifying coping and relaxation techniques along with practicing a walking meditation, identifying ways to set and maintain boundaries in her relationship with patient's daughter  Current Status:   The patient reports increased social involvement and increased physical activity but continued periods of nervousness and sadness approximately 2-3 days out of 7 days.  Patient Progress:   Fair. Patient reports she began taking Haldol and December per Dr. Sheela Stack instructions. However, she reports discontinuing to take medication last week to side effects of decreased appetite and increased nervousness. She will discuss these concerns with Dr. Lolly Mustache at her next appointment tomorrow. She reports she has increased her involvement in activity and reports recently enjoying a trip to [facility]. Patient also  reports she has increased her use relaxation breathing and has increased light exposure by opening her blinds and curtains which has been helpful.  Target Goals:   1. Resume normal interest in activities. 2. Improved mood, decrease anxiety, and decrease panic attacks. 3. Decrease negative thought patterns.  Last Reviewed:   05/07/2011  Goals Addressed Today:    Improve mood, decrease anxiety  Impression/Diagnosis:   The patient reports experiencing symptoms of anxiety and depression since February 2010. Symptoms appear to have been triggered by patient's losing her job  and and back surgery in 2009. Patient has continued to experience recurrent periods of depression and ongoing anxiety.  Diagnosis:  Axis I:  1. Major depressive disorder, recurrent episode, moderate degree   2. Anxiety disorder             Axis II: Deferred

## 2012-01-21 NOTE — Patient Instructions (Signed)
Continue to use relaxation breathing, continue to increase light exposure, use walking meditation

## 2012-01-22 ENCOUNTER — Encounter (HOSPITAL_COMMUNITY): Payer: Self-pay | Admitting: Psychiatry

## 2012-01-22 ENCOUNTER — Ambulatory Visit (INDEPENDENT_AMBULATORY_CARE_PROVIDER_SITE_OTHER): Payer: Medicare Other | Admitting: Psychiatry

## 2012-01-22 VITALS — Wt 108.0 lb

## 2012-01-22 DIAGNOSIS — F329 Major depressive disorder, single episode, unspecified: Secondary | ICD-10-CM

## 2012-01-22 MED ORDER — FLUOXETINE HCL 40 MG PO CAPS: 40.0000 mg | ORAL_CAPSULE | Freq: Every day | ORAL | Status: DC

## 2012-01-22 MED ORDER — MIRTAZAPINE 15 MG PO TBDP: 15.0000 mg | ORAL_TABLET | Freq: Every day | ORAL | Status: DC

## 2012-01-22 NOTE — Progress Notes (Signed)
Patient came for her followup appointment. She has lost 4 pounds since her last visit. She does not like Haldol as much as she continues to feel nervous and anxious at bedtime. Though she denies any paranoia or any hallucination but wondering if she can try something that can help her sleep and anxiety. In the past we have tried Zyprexa which works very well however she has increased blood sugar and her hemoglobin A1c increase to 6.2. She is seeing her primary care physician who is managing her physical illness and chronic pain. She denies any side effects of medication.   Mental status examination Patient is casually dressed. She appears anxious and maintained a fair eye contact. Her speech is slow but soft and clear. Her attention and concentration is fair. She denies any active or passive suicidal thoughts or homicidal thoughts. She has poverty of thought content but there were no delusions or obsessions present. She described her mood is depressed and anxious and her affect is constricted. She's alert and her yet at x3 and her insight judgment and impulse control is okay.  Assessment Maj. depressive disorder with psychotic features  Plan I will add Remeron 15 mg at bedtime to help anxiety and insomnia and continue Prozac 40 mg which is helping her depressive thoughts. I will discontinue Haldol and Cogentin which did not help a lot. At this time patient does not have any psychotic symptoms we will consider adding antipsychotic if patient complained of psychosis. I have explained risks and benefits of medication including weight gain with Remeron. I recommended to call us if she has any question concerns or side effects of medication. She'll continue to see therapist regularly. I will see her again in 3 weeks.

## 2012-01-31 ENCOUNTER — Ambulatory Visit: Payer: Medicare Other | Admitting: Family Medicine

## 2012-02-12 ENCOUNTER — Encounter (HOSPITAL_COMMUNITY): Payer: Self-pay | Admitting: Psychiatry

## 2012-02-12 ENCOUNTER — Ambulatory Visit (INDEPENDENT_AMBULATORY_CARE_PROVIDER_SITE_OTHER): Payer: Medicare Other | Admitting: Psychiatry

## 2012-02-12 VITALS — Wt 108.0 lb

## 2012-02-12 DIAGNOSIS — F329 Major depressive disorder, single episode, unspecified: Secondary | ICD-10-CM

## 2012-02-12 MED ORDER — MIRTAZAPINE 15 MG PO TBDP: 15.0000 mg | ORAL_TABLET | Freq: Every day | ORAL | Status: DC

## 2012-02-12 MED ORDER — FLUOXETINE HCL 40 MG PO CAPS: 40.0000 mg | ORAL_CAPSULE | Freq: Every day | ORAL | Status: DC
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
## 2012-02-18 ENCOUNTER — Encounter (HOSPITAL_COMMUNITY): Payer: Self-pay | Admitting: Psychiatry

## 2012-02-18 ENCOUNTER — Ambulatory Visit (INDEPENDENT_AMBULATORY_CARE_PROVIDER_SITE_OTHER): Payer: Medicare Other | Admitting: Psychiatry

## 2012-02-18 DIAGNOSIS — F331 Major depressive disorder, recurrent, moderate: Secondary | ICD-10-CM

## 2012-02-18 NOTE — Patient Instructions (Signed)
Practice relaxation breathing 3-4 times per day
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
## 2012-03-11 ENCOUNTER — Ambulatory Visit (INDEPENDENT_AMBULATORY_CARE_PROVIDER_SITE_OTHER): Payer: Medicare Other | Admitting: Family Medicine

## 2012-03-11 VITALS — BP 130/72 | HR 71 | Temp 99.2°F | Resp 16 | Ht 62.0 in | Wt 113.0 lb

## 2012-03-11 DIAGNOSIS — J309 Allergic rhinitis, unspecified: Secondary | ICD-10-CM

## 2012-03-11 DIAGNOSIS — J019 Acute sinusitis, unspecified: Secondary | ICD-10-CM

## 2012-03-11 DIAGNOSIS — J209 Acute bronchitis, unspecified: Secondary | ICD-10-CM

## 2012-03-11 MED ORDER — BENZONATATE 100 MG PO CAPS: 100.0000 mg | ORAL_CAPSULE | Freq: Three times a day (TID) | ORAL | Status: DC | PRN

## 2012-03-11 MED ORDER — PENICILLIN V POTASSIUM 500 MG PO TABS: 500.0000 mg | ORAL_TABLET | Freq: Three times a day (TID) | ORAL | Status: AC

## 2012-03-11 NOTE — Assessment & Plan Note (Signed)
2 week h/o sinus pressure with green/brown post nasal drainage

## 2012-03-11 NOTE — Patient Instructions (Signed)
CPE December 19 or after.  Please call if you need me before,  Mammogram is due in August, please schedule in July  You are being treated for acute sinusitis and bronchitis. Two medications are sent in.  Take sudafed one daily for excessive nasal congestion and drainage.  Use flonase every day for allergies..Saline flushes 2 to 3 times daily to your nostrils, helps to keep the sinuses clear, we will proveide a sample "recipe"

## 2012-03-16 ENCOUNTER — Encounter: Payer: Self-pay | Admitting: Family Medicine

## 2012-03-16 NOTE — Progress Notes (Signed)
  Subjective:    Patient ID: Megan Gibbs, female    DOB: 07/27/1951, 61 y.o.   MRN: [id number]  HPI 2 week h/o head and chest congestion, brown sputum and green nasal drainage. Intermittent chills , no documented fever. Reports good mental health and sees psychiatry chronically   Review of Systems See HPI Denies chest pains, palpitations and leg swelling Denies abdominal pain, nausea, vomiting,diarrhea or constipation.   Denies dysuria, frequency, hesitancy or incontinence. Denies joint pain, swelling and limitation in mobility. Denies headaches, seizures, numbness, or tingling. Denies uncontrolled  depression, anxiety or insomnia. Denies skin break down or rash.        Objective:   Physical Exam Patient alert and oriented and in no cardiopulmonary distress.  HEENT: No facial asymmetry, EOMI, maxillary sinus tenderness,  oropharynx pink and moist.  Neck supple no adenopathy.  Chest: decreased air entry, bilateral crackles , no wheezes  CVS: S1, S2 no murmurs, no S3.  ABD: Soft non tender. Bowel sounds normal.  Ext: No edema  MS: Adequate ROM spine, shoulders, hips and knees.  Skin: Intact, no ulcerations or rash noted.  Psych: Good eye contact, normal affect. Memory intact not anxious or depressed appearing.  CNS: CN 2-12 intact, power, tone and sensation normal throughout.        Assessment & Plan:

## 2012-03-16 NOTE — Assessment & Plan Note (Signed)
Increased symptoms, daily use of flonase recommended during [facility]

## 2012-03-16 NOTE — Assessment & Plan Note (Signed)
Acute infection, antibiotic and  Decongestant prescribed

## 2012-03-17 ENCOUNTER — Ambulatory Visit (INDEPENDENT_AMBULATORY_CARE_PROVIDER_SITE_OTHER): Payer: Medicare Other | Admitting: Psychiatry

## 2012-03-17 ENCOUNTER — Ambulatory Visit (HOSPITAL_COMMUNITY): Payer: Medicare Other | Admitting: Psychiatry

## 2012-03-17 ENCOUNTER — Encounter (HOSPITAL_COMMUNITY): Payer: Self-pay | Admitting: Psychiatry

## 2012-03-17 DIAGNOSIS — F331 Major depressive disorder, recurrent, moderate: Secondary | ICD-10-CM

## 2012-03-17 NOTE — Progress Notes (Signed)
Patient:  Megan Gibbs   DOB: 08-31-1951  MR Number: [id number]  Location: [facility]:  [address] [place],  [place], [address]  Start: Monday 03/17/2012 9:00 AM End: Monday 03/17/2012 9:45 AM  Provider/Observer:     Florencia Reasons, MSW, LCSW   Chief Complaint:      Chief Complaint  Patient presents with  . Anxiety    Reason For Service:     The patient was referred for services by primary care physician Dr. Syliva Overman due to the patient experiencing symptoms of depression with intermittent suicidal ideation. Patient is seen today for follow up appointment as she continues to experience periods of anxiety and depression.  Interventions Strategy:  Supportive therapy, cognitive behavioral therapy  Participation Level:   Active  Participation Quality:  Appropriate      Behavioral Observation:  Fairly Groomed, Alert, and appropriate  Current Psychosocial Factors: The patient reports stress related to her children's father's comments about patient's involvement with another man.  Content of Session:   Reviewing symptoms, reviewing relaxation techniques, identifying ways to increase physical activity,  identifying ways to set and maintain boundaries in her relationship with her children's father  Current Status:   The patient reports experiencing depressed mood and sadness 1-2 x per week along with bad dreams from which she awakens crying 3-4 x per week in the last two weeks.  She reports decreased anxiety.  Patient Progress:   Good. Patient reports she has good days and bad days.  She has not been as active as basketball season is over but she has continued to attend church regularly. She also has maintained social contact with family and friends.  She also has begun seeing a man from her church but reports being upset by negative comments she has received from her children's father regarding the relationship.  Patient expresses frustration as she states feeling as though  children's father is treating her like a child.  She has made efforts to be assertive. Patient reports she has been using the walking meditation and relaxation breathing to try to cope. She and therapist also reviewed ways to increase light exposure and increase exercise.  Target Goals:   1. Resume normal interest in activities. 2. Improved mood, decrease anxiety, and decrease panic attacks. 3. Decrease negative thought patterns.  Last Reviewed:   05/07/2011  Goals Addressed Today:    Improve mood, decrease anxiety  Impression/Diagnosis:   The patient reports experiencing symptoms of anxiety and depression since February 2010. Symptoms appear to have been triggered by patient's losing her job  and and back surgery in 2009. Patient has continued to experience recurrent periods of depression and ongoing anxiety.  Diagnosis:  Axis I:  1. Major depressive disorder, recurrent, moderate             Axis II: Deferred

## 2012-03-17 NOTE — Patient Instructions (Signed)
Increase physical activity, continue to use relaxation breathing and the walking meditation.

## 2012-03-24 ENCOUNTER — Other Ambulatory Visit: Payer: Self-pay | Admitting: Family Medicine

## 2012-03-25 ENCOUNTER — Ambulatory Visit (INDEPENDENT_AMBULATORY_CARE_PROVIDER_SITE_OTHER): Payer: Medicare Other | Admitting: Psychiatry

## 2012-03-25 ENCOUNTER — Encounter (HOSPITAL_COMMUNITY): Payer: Self-pay | Admitting: Psychiatry

## 2012-03-25 VITALS — Wt 113.0 lb

## 2012-03-25 DIAGNOSIS — F329 Major depressive disorder, single episode, unspecified: Secondary | ICD-10-CM

## 2012-03-25 MED ORDER — MIRTAZAPINE 15 MG PO TBDP: 15.0000 mg | ORAL_TABLET | Freq: Every day | ORAL | Status: DC

## 2012-03-25 MED ORDER — FLUOXETINE HCL 40 MG PO CAPS: 40.0000 mg | ORAL_CAPSULE | Freq: Every day | ORAL | Status: DC
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
## 2012-04-14 ENCOUNTER — Ambulatory Visit (INDEPENDENT_AMBULATORY_CARE_PROVIDER_SITE_OTHER): Payer: Medicare Other | Admitting: Psychiatry

## 2012-04-14 ENCOUNTER — Encounter (HOSPITAL_COMMUNITY): Payer: Self-pay | Admitting: Psychiatry

## 2012-04-14 DIAGNOSIS — F331 Major depressive disorder, recurrent, moderate: Secondary | ICD-10-CM

## 2012-04-14 NOTE — Patient Instructions (Addendum)
Discussed orally - increase activity, develop routine and structure
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
## 2012-04-21 ENCOUNTER — Ambulatory Visit: Payer: Self-pay | Admitting: Family Medicine

## 2012-04-22 ENCOUNTER — Ambulatory Visit (INDEPENDENT_AMBULATORY_CARE_PROVIDER_SITE_OTHER): Payer: Medicare Other | Admitting: Family Medicine

## 2012-04-22 ENCOUNTER — Ambulatory Visit (HOSPITAL_COMMUNITY)
Admission: RE | Admit: 2012-04-22 | Discharge: 2012-04-22 | Disposition: A | Discharge status: Home / Self Care | Payer: Medicare Other | Source: Ambulatory Visit | Attending: Family Medicine | Admitting: Family Medicine

## 2012-04-22 ENCOUNTER — Encounter: Payer: Self-pay | Admitting: Family Medicine

## 2012-04-22 VITALS — BP 114/66 | HR 82 | Resp 18 | Ht 62.0 in | Wt 113.0 lb

## 2012-04-22 DIAGNOSIS — M79609 Pain in unspecified limb: Secondary | ICD-10-CM | POA: Insufficient documentation

## 2012-04-22 DIAGNOSIS — S92919A Unspecified fracture of unspecified toe(s), initial encounter for closed fracture: Secondary | ICD-10-CM | POA: Insufficient documentation

## 2012-04-22 DIAGNOSIS — M79676 Pain in unspecified toe(s): Secondary | ICD-10-CM

## 2012-04-22 DIAGNOSIS — X58XXXA Exposure to other specified factors, initial encounter: Secondary | ICD-10-CM | POA: Insufficient documentation

## 2012-04-22 NOTE — Assessment & Plan Note (Signed)
Pt to continue buddy taping, walking boot sent, expect healing in 4-6 weeks, she is to return if she still significant pain at 4 weeks.

## 2012-04-22 NOTE — Progress Notes (Signed)
  Subjective:    Patient ID: Megan Gibbs, female    DOB: [birth year], 61 y.o.   MRN: [id number]  HPI Patient [place] the food bank one week ago when she dropped a can of paint on her toe. Since then she's had pain and swelling of the third toe on the right foot. Denies any drainage from the toe. She had some mild redness on Sunday after wearing high heels.   Review of Systems - per above      Objective:   Physical Exam GEN- NAD, alert and oriented x3 EXT- No edema Right foot- decreased flexion secondary to pain 3rd toe, normal ROM other digits, mild swelling of 3rd toe, no erythema no open lesion Gait-antalgic Pulses- Radial, DP- 2+        Assessment & Plan:   Toe pain- Xray to be obtained,. She is on anti-inflammatory and narcotics from an old shoulder injury given by her orthopedic surgeon.Buddy taped during visit. No signs of infection.

## 2012-04-22 NOTE — Patient Instructions (Signed)
Get the x-ray of your foot I will call with results

## 2012-04-23 ENCOUNTER — Telehealth: Payer: Self-pay | Admitting: Family Medicine

## 2012-04-23 NOTE — Telephone Encounter (Signed)
rx sent

## 2012-05-06 ENCOUNTER — Other Ambulatory Visit (HOSPITAL_COMMUNITY): Payer: Self-pay | Admitting: Psychiatry

## 2012-05-12 ENCOUNTER — Ambulatory Visit (HOSPITAL_COMMUNITY): Payer: Self-pay | Admitting: Psychiatry

## 2012-05-27 ENCOUNTER — Ambulatory Visit (INDEPENDENT_AMBULATORY_CARE_PROVIDER_SITE_OTHER): Payer: Medicare Other | Admitting: Psychiatry

## 2012-05-27 ENCOUNTER — Encounter (HOSPITAL_COMMUNITY): Payer: Self-pay | Admitting: Psychiatry

## 2012-05-27 DIAGNOSIS — F329 Major depressive disorder, single episode, unspecified: Secondary | ICD-10-CM

## 2012-05-27 MED ORDER — FLUOXETINE HCL 40 MG PO CAPS: 40.0000 mg | ORAL_CAPSULE | Freq: Every day | ORAL | Status: DC

## 2012-05-27 MED ORDER — MIRTAZAPINE 15 MG PO TBDP: 15.0000 mg | ORAL_TABLET | Freq: Every day | ORAL | Status: DC
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
## 2012-05-30 ENCOUNTER — Ambulatory Visit (INDEPENDENT_AMBULATORY_CARE_PROVIDER_SITE_OTHER): Payer: Medicare Other | Admitting: Psychiatry

## 2012-05-30 DIAGNOSIS — F331 Major depressive disorder, recurrent, moderate: Secondary | ICD-10-CM

## 2012-05-30 NOTE — Progress Notes (Signed)
Patient:  Megan Gibbs   DOB: [birth year]  MR Number: [id number]  Location: [facility]:  [address] [place],  [place], [address]  Start: Friday 05/30/2012 10:00 AM End: Friday 05/30/2012 10:45 AM  Provider/Observer:     Florencia Reasons, MSW, LCSW   Chief Complaint:      Chief Complaint  Patient presents with  . Depression  . Anxiety    Reason For Service:     The patient was referred for services by primary care physician Dr. Syliva Overman due to the patient experiencing symptoms of depression with intermittent suicidal ideation. Patient is seen today for follow up appointment as she continues to experience periods of anxiety and depression.  Interventions Strategy:  Supportive therapy, cognitive behavioral therapy  Participation Level:   Active  Participation Quality:  Appropriate      Behavioral Observation:  Fairly Groomed, Alert, and appropriate, happy, talkative  Current Psychosocial Factors: The patient reports increased involvement in her relationship with a man from her church.  Content of Session:   Reviewing symptoms, processing feelings, identifying patient's use of assertiveness skills  Current Status:   The patient reports improved mood, improved sleep pattern, improved appetite, increased motivation, absence of panic attacks, and decreased anxiety.   Patient Progress:   Good. Patient reports absence of depressed moods since last session. She is excited today as she is involved more closely in the relationship with a man she is dating from church. She reports the relationship is going very well. One of her daughters disapproves of the relationship. However, patient has been able to set and maintain boundaries with her daughter regarding this. Patient also reports using assertiveness skills successfully in the relationship with her ex-husband. Patient reports increased involvement in activities attending church regularly, doing errands, and socializing with  family and friends. Patient also denies having any panic attacks since last session.  Target Goals:   1. Resume normal interest in activities. 2. Improved mood, decrease anxiety, and decrease panic attacks. 3. Decrease negative thought patterns.  Last Reviewed:   05/07/2011  Goals Addressed Today:    Improve mood, decrease anxiety  Impression/Diagnosis:   The patient reports experiencing symptoms of anxiety and depression since February 2010. Symptoms appear to have been triggered by patient's losing her job  and and back surgery in 2009. Patient has continued to experience recurrent periods of depression and ongoing anxiety.  Diagnosis:  Axis I:  1. Major depressive disorder, recurrent, moderate             Axis II: Deferred

## 2012-05-30 NOTE — Patient Instructions (Signed)
Discussed orally 

## 2012-07-11 ENCOUNTER — Ambulatory Visit (HOSPITAL_COMMUNITY): Payer: Self-pay | Admitting: Psychiatry

## 2012-07-16 ENCOUNTER — Other Ambulatory Visit: Payer: Self-pay | Admitting: Family Medicine

## 2012-07-16 DIAGNOSIS — Z139 Encounter for screening, unspecified: Secondary | ICD-10-CM

## 2012-07-24 ENCOUNTER — Ambulatory Visit (INDEPENDENT_AMBULATORY_CARE_PROVIDER_SITE_OTHER): Payer: Medicare Other | Admitting: Psychiatry

## 2012-07-24 ENCOUNTER — Encounter (HOSPITAL_COMMUNITY): Payer: Self-pay | Admitting: Psychiatry

## 2012-07-24 DIAGNOSIS — F329 Major depressive disorder, single episode, unspecified: Secondary | ICD-10-CM

## 2012-07-24 MED ORDER — MIRTAZAPINE 15 MG PO TBDP: 15.0000 mg | ORAL_TABLET | Freq: Every day | ORAL | Status: DC

## 2012-07-24 MED ORDER — FLUOXETINE HCL 40 MG PO CAPS: 40.0000 mg | ORAL_CAPSULE | Freq: Every day | ORAL | Status: DC
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
## 2012-08-05 ENCOUNTER — Ambulatory Visit (INDEPENDENT_AMBULATORY_CARE_PROVIDER_SITE_OTHER): Payer: Medicare Other | Admitting: Psychiatry

## 2012-08-05 DIAGNOSIS — F331 Major depressive disorder, recurrent, moderate: Secondary | ICD-10-CM

## 2012-08-05 NOTE — Patient Instructions (Addendum)
Discussed orally 

## 2012-08-05 NOTE — Progress Notes (Signed)
Patient:  Megan Gibbs   DOB: 1951-06-21  MR Number: [id number]  [facility]: [facility]:  [address] [place],  [place], [address]  Start: Tuesday 08/05/2012  10:00 AM End: Tuesday 08/05/2012  10:50 AM  Provider/Observer:     Florencia Reasons, MSW, LCSW   Chief Complaint:      Chief Complaint  Patient presents with  . Anxiety  . Depression    Reason For Service:     The patient was referred for services by primary care physician Dr. Syliva Overman due to the patient experiencing symptoms of depression with intermittent suicidal ideation. Patient is seen today for follow up appointment as she continues to experience periods of anxiety and depression.  Interventions Strategy:  Supportive therapy, cognitive behavioral therapy  Participation Level:   Active  Participation Quality:  Appropriate      Behavioral Observation:  Fairly Groomed, Alert,tearful,   Current Psychosocial Factors: The patient reports conflict with her mother  Content of Session:   Reviewing symptoms, processing feelings, identifying ways to set and maintain boundaries  Current Status:   The patient reports decreased appetite, sleep difficulty, and anxiety.  Patient Progress:   Fair. . Patient reports doing well until about 3 or 4 weeks ago when her mother gave patient a deadline of August 1 to move out of the family home. Patient has sought legal advice and was informed that mother is not able to evict patient due to multiple family members' names including patient's being on the deed. Patient reports she thinks her mother wants her to leave because mother disapproves of the man patient is dating. Patient also reports that her mother has been spreading rumors to various family members as well as patient's pastor about patient's involvement with this man. Patient expresses hurt and disappointment in her mother. Patient reports initially becoming very sad, worried, and experiencing loss of appetite as well as  sleep difficulty. She reports she is starting to feel better and has decided to move from the family home as she is tired of going through these issues with her mother. Patient hopes that she will be able to move within the next month. Therapist works with patient to process her feelings as well as identify ways to set and maintain boundaries. Therapist also works with patient to review coping and relaxation techniques. Patient has strong support from her daughters, her siblings, and the man she is dating. Patient is trying to maintain involvement in activities including regularly attending church and socializing with family and friends  Target Goals:   1. Resume normal interest in activities. 2. Improved mood, decrease anxiety, and decrease panic attacks. 3. Decrease negative thought patterns.  Last Reviewed:   05/07/2011  Goals Addressed Today:    Improve mood, decrease anxiety  Impression/Diagnosis:   The patient reports experiencing symptoms of anxiety and depression since February 2010. Symptoms appear to have been triggered by patient's losing her job  and and back surgery in 2009. Patient has continued to experience recurrent periods of depression and ongoing anxiety.  Diagnosis:  Axis I:  1. Major depressive disorder, recurrent, moderate             Axis II: Deferred

## 2012-08-25 ENCOUNTER — Ambulatory Visit (HOSPITAL_COMMUNITY)
Admission: RE | Admit: 2012-08-25 | Discharge: 2012-08-25 | Disposition: A | Discharge status: Home / Self Care | Payer: Medicare Other | Source: Ambulatory Visit | Attending: Family Medicine | Admitting: Family Medicine

## 2012-08-25 DIAGNOSIS — Z139 Encounter for screening, unspecified: Secondary | ICD-10-CM

## 2012-08-25 DIAGNOSIS — Z1231 Encounter for screening mammogram for malignant neoplasm of breast: Secondary | ICD-10-CM | POA: Insufficient documentation

## 2012-08-26 ENCOUNTER — Ambulatory Visit (HOSPITAL_COMMUNITY): Payer: Self-pay | Admitting: Psychiatry

## 2012-08-27 ENCOUNTER — Ambulatory Visit (INDEPENDENT_AMBULATORY_CARE_PROVIDER_SITE_OTHER): Payer: Self-pay | Admitting: Psychiatry

## 2012-08-27 DIAGNOSIS — F329 Major depressive disorder, single episode, unspecified: Secondary | ICD-10-CM

## 2012-08-27 NOTE — Patient Instructions (Signed)
Discussed orally 
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
## 2012-09-23 ENCOUNTER — Ambulatory Visit (INDEPENDENT_AMBULATORY_CARE_PROVIDER_SITE_OTHER): Payer: Self-pay | Admitting: Psychiatry

## 2012-09-23 ENCOUNTER — Encounter (HOSPITAL_COMMUNITY): Payer: Self-pay | Admitting: Psychiatry

## 2012-09-23 VITALS — Wt 111.0 lb

## 2012-09-23 DIAGNOSIS — Z79899 Other long term (current) drug therapy: Secondary | ICD-10-CM

## 2012-09-23 DIAGNOSIS — F329 Major depressive disorder, single episode, unspecified: Secondary | ICD-10-CM

## 2012-09-23 MED ORDER — FLUOXETINE HCL 40 MG PO CAPS: 40.0000 mg | ORAL_CAPSULE | Freq: Every day | ORAL | Status: DC

## 2012-09-23 MED ORDER — MIRTAZAPINE 15 MG PO TBDP: 15.0000 mg | ORAL_TABLET | Freq: Every day | ORAL | Status: DC
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
## 2012-09-24 ENCOUNTER — Ambulatory Visit (INDEPENDENT_AMBULATORY_CARE_PROVIDER_SITE_OTHER): Payer: Medicare Other

## 2012-09-24 ENCOUNTER — Ambulatory Visit (INDEPENDENT_AMBULATORY_CARE_PROVIDER_SITE_OTHER): Payer: Self-pay | Admitting: Psychiatry

## 2012-09-24 DIAGNOSIS — F329 Major depressive disorder, single episode, unspecified: Secondary | ICD-10-CM

## 2012-09-24 DIAGNOSIS — Z23 Encounter for immunization: Secondary | ICD-10-CM

## 2012-09-24 LAB — COMPREHENSIVE METABOLIC PANEL
ALT: 19 U/L (ref 0–35)
Alkaline Phosphatase: 78 U/L (ref 39–117)
CO2: 25 mEq/L (ref 19–32)
Sodium: 143 mEq/L (ref 135–145)
Total Bilirubin: 0.3 mg/dL (ref 0.3–1.2)
Total Protein: 6.8 g/dL (ref 6.0–8.3)

## 2012-09-24 LAB — CBC WITH DIFFERENTIAL/PLATELET
Eosinophils Absolute: 0.1 10*3/uL (ref 0.0–0.7)
Lymphs Abs: 2.6 10*3/uL (ref 0.7–4.0)
MCH: 27 pg (ref 26.0–34.0)
Neutro Abs: 1.7 10*3/uL (ref 1.7–7.7)
Neutrophils Relative %: 35 % — ABNORMAL LOW (ref 43–77)
Platelets: 281 10*3/uL (ref 150–400)
RBC: 4.41 MIL/uL (ref 3.87–5.11)
WBC: 4.8 10*3/uL (ref 4.0–10.5)

## 2012-09-24 LAB — HEMOGLOBIN A1C: Hgb A1c MFr Bld: 5.9 % — ABNORMAL HIGH (ref <5.7)

## 2012-09-24 NOTE — Progress Notes (Signed)
Patient:  Megan Gibbs   DOB: 01/26/1951  MR Number: [id number]  Location: [facility]:  [address] [place] [place],  [place], [address]  Start: Wednesday 09/24/2012 9:00 AM End: Wednesday 09/24/2012 9:45 AM  Provider/Observer:     Florencia Reasons, MSW, LCSW   Chief Complaint:      Chief Complaint  Patient presents with  . Depression  . Anxiety    Reason For Service:     The patient was referred for services by primary care physician Dr. Syliva Overman due to the patient experiencing symptoms of depression with intermittent suicidal ideation. Patient is seen today for follow up appointment.  Interventions Strategy:  Supportive therapy, cognitive behavioral therapy  Participation Level:   Active  Participation Quality:  Appropriate      Behavioral Observation:  Fairly Groomed, Alert, talkative  Current Psychosocial Factors: The patient reports continued strained relationship with her mother and 2 of her siblings.  Content of Session:   Reviewing symptoms, processing feelings, identifying ways to improve sleep hygiene  Current Status:   The patient reports continued improved mood, improved appetite, and decreased anxiety along with absence of panic attacks and crying spells  Patient Progress:   Good. Patient reports decreased stress regarding her mother and 2 of her siblings as they have had minimal contact since patient's last session. She reports  still planning to move but does not feel as much pressure to move by a designated time. She is hopeful that she will be able to find another place to stay soon. Patient has maintained involvement in activities including going to church, walking regularly, and socializing with family and friends. Patient also has been able to continue successful efforts in setting and maintaining boundaries in her relationships.  Target Goals:   1. Resume normal interest in activities. 2. Improved mood, decrease anxiety, and decrease panic attacks. 3.  Decrease negative thought patterns.  Last Reviewed:   05/07/2011  Goals Addressed Today:    Improve mood, decrease anxiety  Impression/Diagnosis:   The patient reports experiencing symptoms of anxiety and depression since February 2010. Symptoms appear to have been triggered by patient's losing her job  and and back surgery in 2009. Patient has continued to experience recurrent periods of depression and ongoing anxiety.  Diagnosis:  Axis I:  1. Major depressive disorder             Axis II: Deferred

## 2012-09-24 NOTE — Patient Instructions (Signed)
Discussed orally 

## 2012-10-29 ENCOUNTER — Ambulatory Visit (HOSPITAL_COMMUNITY): Payer: Self-pay | Admitting: Psychiatry

## 2012-11-20 ENCOUNTER — Encounter (HOSPITAL_COMMUNITY): Payer: Self-pay | Admitting: Psychiatry

## 2012-11-20 ENCOUNTER — Ambulatory Visit (INDEPENDENT_AMBULATORY_CARE_PROVIDER_SITE_OTHER): Payer: Medicare Other | Admitting: Psychiatry

## 2012-11-20 VITALS — BP 132/78 | HR 60 | Ht 60.75 in | Wt 106.0 lb

## 2012-11-20 DIAGNOSIS — F411 Generalized anxiety disorder: Secondary | ICD-10-CM

## 2012-11-20 DIAGNOSIS — G4700 Insomnia, unspecified: Secondary | ICD-10-CM

## 2012-11-20 DIAGNOSIS — M549 Dorsalgia, unspecified: Secondary | ICD-10-CM

## 2012-11-20 DIAGNOSIS — F329 Major depressive disorder, single episode, unspecified: Secondary | ICD-10-CM

## 2012-11-20 MED ORDER — AMITRIPTYLINE HCL 25 MG PO TABS: 25.0000 mg | ORAL_TABLET | Freq: Every day | ORAL | Status: DC

## 2012-11-20 MED ORDER — ANALGESIC GRX BALM EX OINT: 1.0000 "application " | TOPICAL_OINTMENT | Freq: Three times a day (TID) | CUTANEOUS | Status: DC

## 2012-11-20 MED ORDER — OMEPRAZOLE 20 MG PO CPDR: 20.0000 mg | DELAYED_RELEASE_CAPSULE | Freq: Every day | ORAL | Status: DC

## 2012-11-20 MED ORDER — DULOXETINE HCL 30 MG PO CPEP: 30.0000 mg | ORAL_CAPSULE | Freq: Every day | ORAL | Status: DC
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
## 2012-11-20 NOTE — Patient Instructions (Signed)
Take care of yourself.  Non one is standing up to do that for you.  Only you know your needs best.  It is your job.  Please accept it. IT IS YOUR JOB.  Take Cymbalta once a day for anxiety depression and pain.  Take Elavil for insomnia, pain, depression, and anxiety   Take Analgesic balm for pain management  Meditation every day helps get your spirit in line with God.  Strongly consider attending at least 6 [employer] Meetings to help you learn about how your helping others to the exclusion of helping yourself is actually hurting yourself and is actually an addiction to fixing others and that you need to work the 12 Step to Happiness through the [employer]. [facility] Family Groups could be helpful with how to deal with substance abusing family and friends. Or your own issues of being in victim role.  There are only 40 [facility] Family Group meetings a week here in [place].  Online are current listing of those meetings @ [email or web address]  There are [employer].  Search on line and there you can learn the format and can access the schedule for yourself.  Their number is [phone].

## 2012-12-05 ENCOUNTER — Ambulatory Visit (INDEPENDENT_AMBULATORY_CARE_PROVIDER_SITE_OTHER): Payer: Self-pay | Admitting: Psychiatry

## 2012-12-05 DIAGNOSIS — F331 Major depressive disorder, recurrent, moderate: Secondary | ICD-10-CM

## 2012-12-05 NOTE — Progress Notes (Signed)
Patient:  Megan Gibbs   DOB: 07/25/1951  MR Number: [id number]  Location: [facility]:  [address] [place],  [place], [address]  Start: Friday 12/05/2012 9:00 AM End: Friday 12/05/2012 9:50 AM  Provider/Observer:     Florencia Reasons, MSW, LCSW   Chief Complaint:      Chief Complaint  Patient presents with  . Depression  . Anxiety    Reason For Service:     The patient was referred for services by primary care physician Dr. Syliva Overman due to the patient experiencing symptoms of depression with intermittent suicidal ideation. Patient is seen today for follow up appointment.  Interventions Strategy:  Supportive therapy, cognitive behavioral therapy  Participation Level:   Active  Participation Quality:  Appropriate      Behavioral Observation:  Fairly Groomed, Alert, tearful, anxious, depressed  Current Psychosocial Factors: The patient reports continued strained relationship with her mother and her siblings  Content of Session:   Reviewing symptoms, processing feelings, identifying ways to set and maintain boundaries, working with patient to do activity scheduling, identifying ways to improve self-care, encouraging patient to improve medication compliance  Current Status:   The patient reports depressed mood, decreased appetite, increased anxiety, decreased interest in self-care, and tearfulness.  Patient Progress:   Fair. Patient reports she discontinued taking Prozac and Remeron as she was feeling better a couple of months ago. However, she reports increased depressed mood which in the past several weeks. Patient has seen Dr. Dan Humphreys for medication management and has begun taking Cymbalta. Patient reports increased stress as her mother and one of her siblings have been spreading negative rumors about patient. She reports being able to cope with this better before she discontinued taking medication. Therapist works with patient to process her feelings and to identify  ways to set and maintain boundaries in the relationship with her mother and her siblings. Therapist also works with patient to identify supportive family members. Patient also reports increased anxiety as she is having difficulty finding a place to move. Therapist works with patient to review relaxation techniques including diaphragmatic breathing and a walking meditation. Therapist and patient also discuss patient's spirituality and ways to his strengthen. Patient continues to attend church but is involved in little activity during the day. Therapist also works with patient to develop a schedule to increase activity.   Target Goals:   1. Resume normal interest in activities. 2. Improved mood, decrease anxiety, and decrease panic attacks. 3. Decrease negative thought patterns.  Last Reviewed:   05/07/2011  Goals Addressed Today:    Improve mood, decrease anxiety  Impression/Diagnosis:   The patient reports experiencing symptoms of anxiety and depression since February 2010. Symptoms appear to have been triggered by patient's losing her job  and and back surgery in 2009. Patient has continued to experience recurrent periods of depression and ongoing anxiety.  Diagnosis:  Axis I:  1. Major depressive disorder, recurrent episode, moderate degree             Axis II: Deferred

## 2012-12-05 NOTE — Patient Instructions (Addendum)
Use relaxation breathing Do the walking meditation Take medication regularly as prescribed by psychiatrist Use activity scheduling

## 2012-12-17 ENCOUNTER — Encounter: Payer: Self-pay | Admitting: Family Medicine

## 2012-12-17 ENCOUNTER — Encounter: Payer: Medicare Other | Admitting: Family Medicine

## 2012-12-18 ENCOUNTER — Ambulatory Visit (INDEPENDENT_AMBULATORY_CARE_PROVIDER_SITE_OTHER): Payer: Medicare Other | Admitting: Psychiatry

## 2012-12-18 ENCOUNTER — Telehealth: Payer: Self-pay | Admitting: Family Medicine

## 2012-12-18 ENCOUNTER — Encounter (HOSPITAL_COMMUNITY): Payer: Self-pay | Admitting: Psychiatry

## 2012-12-18 VITALS — Wt 106.8 lb

## 2012-12-18 DIAGNOSIS — F3289 Other specified depressive episodes: Secondary | ICD-10-CM

## 2012-12-18 DIAGNOSIS — F329 Major depressive disorder, single episode, unspecified: Secondary | ICD-10-CM

## 2012-12-18 DIAGNOSIS — M549 Dorsalgia, unspecified: Secondary | ICD-10-CM

## 2012-12-18 DIAGNOSIS — F411 Generalized anxiety disorder: Secondary | ICD-10-CM

## 2012-12-18 DIAGNOSIS — F39 Unspecified mood [affective] disorder: Secondary | ICD-10-CM

## 2012-12-18 MED ORDER — AMITRIPTYLINE HCL 25 MG PO TABS: 25.0000 mg | ORAL_TABLET | Freq: Every day | ORAL | Status: DC

## 2012-12-18 MED ORDER — ALENDRONATE SODIUM 70 MG PO TABS: ORAL_TABLET | ORAL | Status: DC

## 2012-12-18 MED ORDER — DULOXETINE HCL 30 MG PO CPEP: 30.0000 mg | ORAL_CAPSULE | Freq: Every day | ORAL | Status: DC

## 2012-12-18 NOTE — Telephone Encounter (Signed)
Sent in

## 2012-12-18 NOTE — Patient Instructions (Signed)
Call if the pharmacy still has trouble with getting the Elavil for you.   Have a happy holiday.
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
## 2012-12-29 ENCOUNTER — Encounter: Payer: Self-pay | Admitting: Family Medicine

## 2012-12-29 ENCOUNTER — Ambulatory Visit (INDEPENDENT_AMBULATORY_CARE_PROVIDER_SITE_OTHER): Payer: Medicare Other | Admitting: Family Medicine

## 2012-12-29 VITALS — BP 100/70 | HR 80 | Resp 16 | Ht 62.0 in | Wt 107.4 lb

## 2012-12-29 DIAGNOSIS — M549 Dorsalgia, unspecified: Secondary | ICD-10-CM

## 2012-12-29 DIAGNOSIS — J309 Allergic rhinitis, unspecified: Secondary | ICD-10-CM

## 2012-12-29 DIAGNOSIS — M810 Age-related osteoporosis without current pathological fracture: Secondary | ICD-10-CM

## 2012-12-29 DIAGNOSIS — R7309 Other abnormal glucose: Secondary | ICD-10-CM

## 2012-12-29 DIAGNOSIS — F411 Generalized anxiety disorder: Secondary | ICD-10-CM

## 2012-12-29 DIAGNOSIS — Z1211 Encounter for screening for malignant neoplasm of colon: Secondary | ICD-10-CM

## 2012-12-29 DIAGNOSIS — R7303 Prediabetes: Secondary | ICD-10-CM | POA: Insufficient documentation

## 2012-12-29 DIAGNOSIS — E785 Hyperlipidemia, unspecified: Secondary | ICD-10-CM

## 2012-12-29 LAB — LIPID PANEL
HDL: 64 mg/dL (ref >39)
LDL Cholesterol: 127 mg/dL — ABNORMAL HIGH (ref 0–99)
VLDL: 18 mg/dL (ref 0–40)

## 2012-12-29 LAB — HEMOGLOBIN A1C: Hgb A1c MFr Bld: 5.6 % (ref <5.7)

## 2012-12-29 MED ORDER — CALCIUM CARBONATE-VITAMIN D 500-200 MG-UNIT PO TABS: 1.0000 | ORAL_TABLET | Freq: Two times a day (BID) | ORAL | Status: DC

## 2012-12-29 NOTE — Patient Instructions (Addendum)
Pelvic and breast in 4  month, please call if you need me before.  Rectal exam today.  Fasting lipid, hBA1C today   You need to start calcium with D one twice daily, this has been sent to your pharmacy  Continue therapy, you will benefit from this, I hope your mental health improves

## 2012-12-29 NOTE — Progress Notes (Signed)
  Subjective:    Patient ID: Megan Gibbs, female    DOB: 1951-09-01, 61 y.o.   MRN: [id number]  HPI The PT is here for follow up and re-evaluation of chronic medical conditions, medication management and review of any available recent lab and radiology data.  Preventive health is updated, specifically  Cancer screening and Immunization.   Questions or concerns regarding consultations or procedures which the PT has had in the interim are  Addressed.Has a new psychiatrist and is very comfortable with her new Doc as well, as the treatment regime change. Continues to have family stress, not speaking to her Mother and to only 2 of her siblings, property issues involved. States her children are supportive The PT denies any adverse reactions to current medications since the last visit.  There are no new concerns.  There are no specific complaints       Review of Systems See HPI Denies recent fever or chills. Denies sinus pressure, nasal congestion, ear pain or sore throat. Denies chest congestion, productive cough or wheezing. Denies chest pains, palpitations and leg swelling Denies abdominal pain, nausea, vomiting,diarrhea or constipation.   Denies dysuria, frequency, hesitancy or incontinence. Denies joint pain, swelling and limitation in mobility. Denies headaches, seizures, numbness, or tingling. Denies uncontrolled  depression, anxiety or insomnia. Denies skin break down or rash.        Objective:   Physical Exam  Patient alert and oriented and in no cardiopulmonary distress.  HEENT: No facial asymmetry, EOMI, no sinus tenderness,  oropharynx pink and moist.  Neck supple no adenopathy.  Chest: Clear to auscultation bilaterally.  CVS: S1, S2 no murmurs, no S3.  ABD: Soft non tender. Bowel sounds normal. Rectal: no mas, heme negative stool Ext: No edema  MS: Adequate ROM spine, shoulders, hips and knees.  Skin: Intact, no ulcerations or rash noted.  Psych: Good eye  contact, normal affect. Memory intact mildly anxious not  depressed appearing.  CNS: CN 2-12 intact, power, tone and sensation normal throughout.       Assessment & Plan:

## 2012-12-30 NOTE — Assessment & Plan Note (Signed)
Controlled, no change in medication  

## 2012-12-30 NOTE — Assessment & Plan Note (Signed)
Controlled, no current flare 

## 2012-12-30 NOTE — Assessment & Plan Note (Signed)
Stable , but reports increased family stress over property, encouraged her to maintain therapy sessions

## 2012-12-30 NOTE — Assessment & Plan Note (Addendum)
continue calcium with D and fosamax. Weight bearing exercise encouraged   also

## 2012-12-30 NOTE — Assessment & Plan Note (Signed)
Deteriorated, low fat diet discussed and encouraged 

## 2012-12-30 NOTE — Assessment & Plan Note (Signed)
Updated lab needed, pt counseled re the need to reduce carb intake and keep active

## 2013-01-02 ENCOUNTER — Ambulatory Visit (INDEPENDENT_AMBULATORY_CARE_PROVIDER_SITE_OTHER): Payer: Medicare Other | Admitting: Psychiatry

## 2013-01-02 DIAGNOSIS — F329 Major depressive disorder, single episode, unspecified: Secondary | ICD-10-CM

## 2013-01-02 NOTE — Progress Notes (Signed)
Patient:  Megan Gibbs   DOB: 1951/12/24  MR Number: [id number]  Location: [facility]:  [address] [place],  [place], [address]  Start: Friday 01/02/2013  9 10 AM End: Friday 01/02/2013  9:55 AM  Provider/Observer:     Florencia Reasons, MSW, LCSW   Chief Complaint:      Chief Complaint  Patient presents with  . Anxiety  . Other    sadness    Reason For Service:     The patient was referred for services by primary care physician Dr. Syliva Overman due to the patient experiencing symptoms of depression with intermittent suicidal ideation. Patient is seen today for follow up appointment.  Interventions Strategy:  Supportive therapy, cognitive behavioral therapy  Participation Level:   Active  Participation Quality:  Appropriate      Behavioral Observation:  Fairly Groomed, Alert, tearful, anxious, depressed  Current Psychosocial Factors: The patient reports continued strained relationship with her mother and her siblings  Content of Session:   Reviewing symptoms, processing feelings, reinforcing patient's efforts to set and maintain boundaries, reinforcing patient's efforts to improve self-care and identify ways to maintain consistency  Current Status:   The patient reports increased sadness, crying spells, and increased anxiety but improved interest in self-care, improved appetite, and increased involvement in activity.  Patient Progress:   Fair. Patient reports experiencing increased emotionality in the past week and a half and states crying and being more nervous. She reports being glad to see her daughters and grandchildren during the holidays but also experiencing increased sadness as she did not have any contact with her mother and some of her siblings. She expresses hurt and disappointment about her relationship with her mother and her siblings. Therapist works with patient to process her feelings and sense of loss regarding holiday traditions. Patient reports improved  efforts regarding setting and maintaining boundaries in her relationships. She also reports improved efforts regarding self-care especially regarding eating patterns. Therapist works with patient to reinforce efforts to improve self-care and to identify ways to maintain consistency.  Target Goals:   1. Resume normal interest in activities. 2. Improved mood, decrease anxiety, and decrease panic attacks. 3. Decrease negative thought patterns.  Last Reviewed:   05/07/2011  Goals Addressed Today:    Improve mood, decrease anxiety  Impression/Diagnosis:   The patient reports experiencing symptoms of anxiety and depression since February 2010. Symptoms appear to have been triggered by patient's losing her job  and and back surgery in 2009. Patient has continued to experience recurrent periods of depression and ongoing anxiety.  Diagnosis:  Axis I:  1. Major depressive disorder             Axis II: Deferred

## 2013-01-02 NOTE — Patient Instructions (Signed)
Discussed orally 

## 2013-01-13 ENCOUNTER — Telehealth: Payer: Self-pay | Admitting: Family Medicine

## 2013-01-14 NOTE — Telephone Encounter (Signed)
Blood sugars are normal. Cholesterol total and bad slightly increased, give values. She needs to lower fried and fatty food intake and red meat

## 2013-01-14 NOTE — Telephone Encounter (Signed)
Are there any comments to made on her labs?

## 2013-01-14 NOTE — Telephone Encounter (Signed)
Patient aware.

## 2013-01-21 ENCOUNTER — Ambulatory Visit (HOSPITAL_COMMUNITY): Payer: Self-pay | Admitting: Psychiatry

## 2013-01-23 ENCOUNTER — Ambulatory Visit (HOSPITAL_COMMUNITY): Payer: Self-pay | Admitting: Psychiatry

## 2013-03-18 ENCOUNTER — Ambulatory Visit (INDEPENDENT_AMBULATORY_CARE_PROVIDER_SITE_OTHER): Payer: Medicare Other | Admitting: Psychiatry

## 2013-03-18 ENCOUNTER — Encounter (HOSPITAL_COMMUNITY): Payer: Self-pay | Admitting: Psychiatry

## 2013-03-18 VITALS — Wt 106.4 lb

## 2013-03-18 DIAGNOSIS — F411 Generalized anxiety disorder: Secondary | ICD-10-CM

## 2013-03-18 MED ORDER — FLUOXETINE HCL 20 MG PO TABS: 10.0000 mg | ORAL_TABLET | Freq: Every day | ORAL | Status: DC

## 2013-03-18 MED ORDER — MIRTAZAPINE 15 MG PO TBDP: 15.0000 mg | ORAL_TABLET | Freq: Every day | ORAL | Status: DC

## 2013-03-18 NOTE — Patient Instructions (Signed)
CUT BACK/CUT OUT on sugar and carbohydrates, that means very limited fruits and starchy vegetables and very limited grains, breads  The goal is low GLYCEMIC INDEX.  CUT OUT all wheat, rye, or barley for the GLUTEN in them.  HIGH fat and LOW carbohydrate diet is the KEY.  Eat avocados, eggs, lean meat like grass fed beef and chicken  Nuts and seeds would be good foods as well.   Stevia is an excellent sweetener.  Safe for the brain.   Lowella Grip is also a good safe sweetener, not the baking blend form of Truvia  Almond butter is awesome.  Check out all this on the Internet.  Dr Heber Dixie is on the Internet with some good info about this.   http://www.drperlmutter.com is where that is.  An excellent site for info on this diet is http://paloeleap.com  Set a timer for 8 minutes and walk for that amount of time in the house or in the yard.  Mark "8" on a calendar for that day.  Do that every day this week.  Then next week increase the time to 9 minutes and then mark the calendar with a 9 for that day.  Each week increase your exercise by one minute.  Keep a record of this so you can see what progress you are making.  Do this every day, just like eating and sleeping.  It is good for pain control, depression, and for your soul/spirit.  Bring the record in for your next visit so we can talk about your effort and how you feel with the new exercise program going and working for you.  Call if problems or concerns.
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
## 2013-03-31 ENCOUNTER — Encounter: Payer: Self-pay | Admitting: Family Medicine

## 2013-03-31 ENCOUNTER — Ambulatory Visit (INDEPENDENT_AMBULATORY_CARE_PROVIDER_SITE_OTHER): Payer: Medicare Other | Admitting: Family Medicine

## 2013-03-31 VITALS — BP 128/70 | HR 66 | Resp 18 | Ht 62.0 in | Wt 109.0 lb

## 2013-03-31 DIAGNOSIS — J309 Allergic rhinitis, unspecified: Secondary | ICD-10-CM

## 2013-03-31 DIAGNOSIS — F411 Generalized anxiety disorder: Secondary | ICD-10-CM

## 2013-03-31 DIAGNOSIS — M549 Dorsalgia, unspecified: Secondary | ICD-10-CM

## 2013-03-31 DIAGNOSIS — E785 Hyperlipidemia, unspecified: Secondary | ICD-10-CM

## 2013-03-31 MED ORDER — CETIRIZINE HCL 10 MG PO TBDP: 1.0000 | ORAL_TABLET | Freq: Every day | ORAL | Status: DC

## 2013-03-31 MED ORDER — CYCLOBENZAPRINE HCL 10 MG PO TABS: ORAL_TABLET | ORAL | Status: AC

## 2013-03-31 NOTE — Assessment & Plan Note (Signed)
Uncontrolled, with intermittent spasm, esp following lifting and in coold/rainy weather, muscle relaxant as needed, in limited supply

## 2013-03-31 NOTE — Patient Instructions (Signed)
F/u in 4 month, call if you need me before.  I believe the rash is aggravated by stress and anxiety.  Zyrtec is excellent for allergies and rash, since you ghave Spring allergies then zyrtec is good to take daily  Muscle relaxants one at night as needed, for back spasm are prescribed, 30 tablets should last for at least 2 month

## 2013-03-31 NOTE — Assessment & Plan Note (Signed)
Increased symptoms in spring, along with the urticaria, pt to start zyrtec for management

## 2013-03-31 NOTE — Assessment & Plan Note (Signed)
Increased and uncontrolled, encouragedclose f/u with therapist, I believe this is the underlying problem

## 2013-03-31 NOTE — Assessment & Plan Note (Signed)
Hyperlipidemia:Low fat diet discussed and encouraged.   

## 2013-03-31 NOTE — Progress Notes (Signed)
  Subjective:    Patient ID: Megan Gibbs, female    DOB: [birth year], 62 y.o.   MRN: [id number]  HPI  Breaking out in hives intermittently since January. Ate fresh pineapples and in January tongue got raw, notices most drinks other water cause problems. Denies wheeze or chest tightness C/o ongoing stress with her family, primarily over property Does have allergy Spring symptoms, sneeze, itch , runny nose   Review of Systems See HPI Denies recent fever or chills. Denies sinus pressure, nasal congestion, ear pain or sore throat. Denies chest congestion, productive cough or wheezing. Denies chest pains, palpitations and leg swelling Denies abdominal pain, nausea, vomiting,diarrhea or constipation.   Denies dysuria, frequency, hesitancy or incontinence. Denies joint pain, swelling and limitation in mobility. Denies headaches, seizures, numbness, or tingling. Denies depression, anxiety or insomnia.       Objective:   Physical Exam  Patient alert and oriented and in no cardiopulmonary distress.  HEENT: No facial asymmetry, EOMI, no sinus tenderness,  oropharynx pink and moist.  Neck supple no adenopathy.  Chest: Clear to auscultation bilaterally.  CVS: S1, S2 no murmurs, no S3.  ABD: Soft non tender. Bowel sounds normal.  Ext: No edema  MS: Adequate ROM spine, shoulders, hips and knees.  Skin: Intact, no ulcerations or rash noted.no urticaria seen  Psych: Good eye contact, normal affect. Memory intact, mildly anxious not depressed appearing.  CNS: CN 2-12 intact, power, tone and sensation normal throughout.       Assessment & Plan:

## 2013-04-03 ENCOUNTER — Ambulatory Visit (INDEPENDENT_AMBULATORY_CARE_PROVIDER_SITE_OTHER): Payer: Medicare Other | Admitting: Psychiatry

## 2013-04-03 DIAGNOSIS — F329 Major depressive disorder, single episode, unspecified: Secondary | ICD-10-CM

## 2013-04-06 NOTE — Patient Instructions (Signed)
Discussed orally 

## 2013-04-06 NOTE — Progress Notes (Signed)
Patient:  Megan Gibbs   DOB: [birth year]  MR Number: [id number]  Location: [facility]:  [address] [place],  [place], [address]  Start: Friday 04/03/2013 11:00 AM End: Friday 04/03/2013 11:50 AM  Provider/Observer:     Florencia Reasons, MSW, LCSW   Chief Complaint:      Chief Complaint  Patient presents with  . Depression    Reason For Service:     The patient was referred for services by primary care physician Dr. Syliva Overman due to the patient experiencing symptoms of depression with intermittent suicidal ideation. Patient is seen today for follow up appointment.  Interventions Strategy:  Supportive therapy, cognitive behavioral therapy  Participation Level:   Active  Participation Quality:  Appropriate      Behavioral Observation:  Fairly Groomed, Alert, anxious  Current Psychosocial Factors: The patient reports continued strained relationship with her mother and her siblings. Patient recently moved  Content of Session:   Reviewing symptoms, processing feelings, reinforcing patient's efforts to set and maintain boundaries, reinforcing patient's efforts to improve self-care and identify ways to maintain consistency, reviewing relaxation techniques  Current Status:   The patient reports decreased sadness, improved interest in self-care, improved appetite, increased involvement in activity but continued anxiety.  Patient Progress:   Good. Patient reports moving to [place] last month. She is relieved that she no longer resides in the family home but now has her own place. She likes her current home but reports experiencing increased anxiety and stress related to the move and managing interactions with her sibling. She continues to have discord with her mother and several of her siblings. However, patient has been able to set and maintain boundaries. Therapist works with patient to reinforce her efforts and to review relaxation techniques. Patient reports improved self-care  efforts regarding nutrition.   Target Goals:   1. Resume normal interest in activities. 2. Improved mood, decrease anxiety, and decrease panic attacks. 3. Decrease negative thought patterns.  Last Reviewed:   05/07/2011  Goals Addressed Today:    Improve mood, decrease anxiety  Impression/Diagnosis:   The patient reports experiencing symptoms of anxiety and depression since February 2010. Symptoms appear to have been triggered by patient's losing her job  and and back surgery in 2009. Patient has continued to experience recurrent periods of depression and ongoing anxiety.  Diagnosis:  Axis I:  Major depressive disorder          Axis II: Deferred

## 2013-04-24 ENCOUNTER — Ambulatory Visit (INDEPENDENT_AMBULATORY_CARE_PROVIDER_SITE_OTHER): Payer: Medicare Other | Admitting: Psychiatry

## 2013-04-24 DIAGNOSIS — F329 Major depressive disorder, single episode, unspecified: Secondary | ICD-10-CM

## 2013-04-24 NOTE — Patient Instructions (Signed)
Discussed orally 

## 2013-04-24 NOTE — Progress Notes (Signed)
Patient:  Megan Gibbs   DOB: 07-13-1951  MR Number: [id number]  Location: [facility] Center:  [address] [place],  [place], [address]  Start: Friday 04/24/2013 9:00 AM End: Friday 4/252014 10:00 AM  Provider/Observer:     Florencia Reasons, MSW, LCSW   Chief Complaint:      Chief Complaint  Patient presents with  . Depression  . Anxiety    Reason For Service:     The patient was referred for services by primary care physician Dr. Syliva Overman due to the patient experiencing symptoms of depression with intermittent suicidal ideation. Patient is seen today for follow up appointment.  Interventions Strategy:  Supportive therapy, cognitive behavioral therapy  Participation Level:   Active  Participation Quality:  Appropriate      Behavioral Observation:  Fairly Groomed, Alert, talkative  Current Psychosocial Factors: The patient reports recent disagreement with her boyfriend.  Content of Session:   Reviewing symptoms, processing feelings, reinforcing patient's efforts to set and maintain boundaries, reinforcing patient's efforts to improve self-care   Current Status:   The patient reports continued decreased sadness, improved interest in self-care, improved appetite, increased involvement in activity, aand improved sleep pattern  Patient Progress:   Good. Patient reports continuing to enjoy residing in her new home. She has maintained involvement in activities socializing with family and attending church. She has improved her eating pattern and reports improved sleep pattern. She reports increased contact with 2 of her siblings. She reports no contact with her mother. However, she has received recent information regarding the family home and taxes. However, patient reports not being upset by this since she no longer resides in the home. She continues to be involved in her relationship with her boyfriend but reports recent conflict that triggered memories of negative experiences  with both of her ex-husbands. Per patient's report, her boyfriend raised his voice when trying to make a point with patient regarding his opinion about patient's situation. However, patient reports being assertive and setting boundaries with her  boyfriend. Therapist works with patient to discuss patterns and triggers from previous relationships and effects on patient's current functioning. Therapist reinforces patient's efforts to set and maintain boundaries.   Target Goals:   1. Resume normal interest in activities. 2. Improved mood, decrease anxiety, and decrease panic attacks. 3. Decrease negative thought patterns.  Last Reviewed:   05/07/2011  Goals Addressed Today:    Improve mood, decrease anxiety  Impression/Diagnosis:   The patient reports experiencing symptoms of anxiety and depression since February 2010. Symptoms appear to have been triggered by patient's losing her job  and and back surgery in 2009. Patient has continued to experience recurrent periods of depression and ongoing anxiety.  Diagnosis:  Axis I:  Major depressive disorder          Axis II: Deferred

## 2013-04-29 ENCOUNTER — Encounter: Payer: Self-pay | Admitting: Family Medicine

## 2013-04-29 ENCOUNTER — Ambulatory Visit (INDEPENDENT_AMBULATORY_CARE_PROVIDER_SITE_OTHER): Payer: Medicare Other | Admitting: Family Medicine

## 2013-04-29 ENCOUNTER — Other Ambulatory Visit (HOSPITAL_COMMUNITY)
Admission: RE | Admit: 2013-04-29 | Discharge: 2013-04-29 | Disposition: A | Discharge status: Home / Self Care | Payer: Medicare Other | Source: Ambulatory Visit | Attending: Family Medicine | Admitting: Family Medicine

## 2013-04-29 VITALS — BP 110/70 | HR 70 | Resp 16 | Ht 62.0 in | Wt 110.0 lb

## 2013-04-29 DIAGNOSIS — Z1211 Encounter for screening for malignant neoplasm of colon: Secondary | ICD-10-CM

## 2013-04-29 DIAGNOSIS — Z0000 Encounter for general adult medical examination without abnormal findings: Secondary | ICD-10-CM

## 2013-04-29 DIAGNOSIS — E785 Hyperlipidemia, unspecified: Secondary | ICD-10-CM

## 2013-04-29 DIAGNOSIS — R7309 Other abnormal glucose: Secondary | ICD-10-CM

## 2013-04-29 DIAGNOSIS — Z1212 Encounter for screening for malignant neoplasm of rectum: Secondary | ICD-10-CM

## 2013-04-29 DIAGNOSIS — R7303 Prediabetes: Secondary | ICD-10-CM

## 2013-04-29 DIAGNOSIS — Z01419 Encounter for gynecological examination (general) (routine) without abnormal findings: Secondary | ICD-10-CM | POA: Insufficient documentation

## 2013-04-29 DIAGNOSIS — Z1151 Encounter for screening for human papillomavirus (HPV): Secondary | ICD-10-CM | POA: Insufficient documentation

## 2013-04-29 NOTE — Progress Notes (Signed)
  Subjective:    Patient ID: Megan Gibbs, female    DOB: March 13, 1951, 62 y.o.   MRN: [id number]  HPI Pt in for pelvic , breast and rectal exam. She does report a past h/o abnormal pap smear. Denies any vaginal discharge or dyspareunia. She denies change in bowel movements and has had a colonoscopy within the past 10 years. There is no family h/o colon cancer She has no concerns or complaints today   Review of Systems See HPI Denies recent fever or chills. Denies sinus pressure, nasal congestion, ear pain or sore throat. Denies chest congestion, productive cough or wheezing. Denies chest pains, palpitations and leg swelling Denies abdominal pain, nausea, vomiting,diarrhea or constipation.   Denies dysuria, frequency, hesitancy or incontinence. Denies joint pain, swelling and limitation in mobility. Denies headaches, seizures, numbness, or tingling. Denies depression, anxiety or insomnia. Denies skin break down or rash.        Objective:   Physical Exam Pleasant well nourished female, alert and oriented x 3, in no cardio-pulmonary distress. Afebrile. HEENT No facial trauma or asymetry. Sinuses non tender.  EOMI, PERTL.[name]  External ears normal, tympanic membranes clear. Oropharynx moist, no exudate,fair dentition. Neck: supple, no adenopathy,JVD or thyromegaly.No bruits.  Chest: Clear to ascultation bilaterally.No crackles or wheezes. Non tender to palpation  Breast: No asymetry,no masses. No nipple discharge or inversion. No axillary or supraclavicular adenopathy  Cardiovascular system; Heart sounds normal,  S1 and  S2 ,no S3.  No murmur, or thrill. Apical beat not displaced Peripheral pulses normal.  Abdomen: Soft, non tender, no organomegaly or masses. No bruits. Bowel sounds normal. No guarding, tenderness or rebound.  Rectal:  No mass. Guaiac negative stool.  GU: External genitalia normal. No lesions. Vaginal canal normal.No discharge. Uterus normal  size, no adnexal masses, no cervical motion or adnexal tenderness.  Musculoskeletal exam: Full ROM of spine, hips , shoulders and knees. No deformity ,swelling or crepitus noted. No muscle wasting or atrophy.   Neurologic: Cranial nerves 2 to 12 intact. Power, tone ,sensation and reflexes normal throughout. No disturbance in gait. No tremor.  Skin: Intact, no ulceration, erythema , scaling or rash noted. Pigmentation normal throughout  Psych; Normal mood and affect. Judgement and concentration normal        Assessment & Plan:

## 2013-04-29 NOTE — Patient Instructions (Addendum)
F/u in 5.5 month, call if you need me before please  Fasting lipid, chem 7, CBc and TSH in 5 month  Pls ensure you get mammogram when due

## 2013-05-03 NOTE — Assessment & Plan Note (Signed)
Pelvic, breast and rectal exam as documented Stool heme negative, no breast mass palpated. Pap sent

## 2013-05-22 ENCOUNTER — Ambulatory Visit (HOSPITAL_COMMUNITY): Payer: Self-pay | Admitting: Psychiatry

## 2013-05-26 ENCOUNTER — Ambulatory Visit (HOSPITAL_COMMUNITY): Payer: Self-pay | Admitting: Psychiatry

## 2013-06-05 ENCOUNTER — Ambulatory Visit (INDEPENDENT_AMBULATORY_CARE_PROVIDER_SITE_OTHER): Payer: Medicare Other | Admitting: Psychiatry

## 2013-06-05 DIAGNOSIS — F329 Major depressive disorder, single episode, unspecified: Secondary | ICD-10-CM

## 2013-06-05 NOTE — Progress Notes (Signed)
Patient:  Megan Gibbs   DOB: 01/10/1951  MR Number: [id number]  Location: [facility]:  [address] [place],  [place], [address]  Start: Friday 06/05/2013 9:00 AM End: Friday 06/05/2013 9:50 AM  Provider/Observer:     Florencia Reasons, MSW, LCSW   Chief Complaint:      Chief Complaint  Patient presents with  . Depression  . Anxiety    Reason For Service:     The patient was referred for services by primary care physician Dr. Syliva Overman due to the patient experiencing symptoms of depression with intermittent suicidal ideation. Patient is seen today for follow up appointment.  Interventions Strategy:  Supportive therapy, cognitive behavioral therapy  Participation Level:   Active  Participation Quality:  Appropriate      Behavioral Observation:  Fairly Groomed, Alert, talkative  Current Psychosocial Factors: The patient reports recent disagreement with her boyfriend.  Content of Session:   Reviewing symptoms, processing feelings, reinforcing patient's efforts to set and maintain boundaries, problem slolving  Current Status:   The patient reports continued decreased sadness, improved interest in self-care, improved appetite, increased involvement in activity, and improved sleep pattern. However, she reports increased anxiety.  Patient Progress:   Good. Patient reports she has decided to move as she is experiencing increased nervousness about driving in the area around her home as she has to travel a  country road with lots of curves. Patient also feels isolated as she lives far away from family. Therapist works with patient to identify what she wants in another home and location. Patient is pleased she has reconciled with her mother and siblings. She has been able to maintain boundaries in the relationship with family. She also reports being assertive with her boyfriend and expressing her concerns about their interactions. Therapist works with patient to identify the way  patient used assertiveness skills and reinforces patient's efforts to set and maintain boundaries. Therapist and patient also review coping and relaxation techniques.   Target Goals:   1. Resume normal interest in activities. 2. Improved mood, decrease anxiety, and decrease panic attacks. 3. Decrease negative thought patterns.  Last Reviewed:   05/07/2011  Goals Addressed Today:    Improve mood, decrease anxiety  Impression/Diagnosis:   The patient reports experiencing symptoms of anxiety and depression since February 2010. Symptoms appear to have been triggered by patient's losing her job  and and back surgery in 2009. Patient has continued to experience recurrent periods of depression and ongoing anxiety.  Diagnosis:  Axis I:  Major depressive disorder          Axis II: Deferred

## 2013-06-05 NOTE — Patient Instructions (Signed)
Discussed orally 

## 2013-06-18 ENCOUNTER — Encounter (HOSPITAL_COMMUNITY): Payer: Self-pay | Admitting: Psychiatry

## 2013-06-18 ENCOUNTER — Ambulatory Visit (INDEPENDENT_AMBULATORY_CARE_PROVIDER_SITE_OTHER): Payer: Medicare Other | Admitting: Psychiatry

## 2013-06-18 VITALS — BP 115/76 | HR 64 | Ht 61.5 in | Wt 107.6 lb

## 2013-06-18 DIAGNOSIS — F5105 Insomnia due to other mental disorder: Secondary | ICD-10-CM | POA: Insufficient documentation

## 2013-06-18 DIAGNOSIS — F329 Major depressive disorder, single episode, unspecified: Secondary | ICD-10-CM

## 2013-06-18 DIAGNOSIS — F411 Generalized anxiety disorder: Secondary | ICD-10-CM

## 2013-06-18 DIAGNOSIS — F418 Other specified anxiety disorders: Secondary | ICD-10-CM

## 2013-06-18 MED ORDER — MIRTAZAPINE 15 MG PO TBDP: 15.0000 mg | ORAL_TABLET | Freq: Every day | ORAL | Status: DC

## 2013-06-18 MED ORDER — FLUOXETINE HCL 20 MG PO TABS: 30.0000 mg | ORAL_TABLET | Freq: Every day | ORAL | Status: DC
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
## 2013-06-18 NOTE — Patient Instructions (Signed)
Could use "Move Free" or "Osteo bi Flex" for arthritic pain.   The important ingredients are Chondrotin Sulfate and Glucosamine.  Tumeric is also helpful for arthritis.   Krill oil and cod liver oil may be helpful for arthritis.   MegaChaga contains Oregeno and Chaga and seems to be very helpful  [employer] is a great source for all of these.  [id number]  Megan Gibbs is a mushroom that has the strongest antiinflammatory properties of any substance known to mankind.  Among other sources, it can be ordered from [email or web address]  Call if problems or concerns.

## 2013-07-02 ENCOUNTER — Ambulatory Visit (INDEPENDENT_AMBULATORY_CARE_PROVIDER_SITE_OTHER): Payer: Medicare Other | Admitting: Psychiatry

## 2013-07-02 DIAGNOSIS — F329 Major depressive disorder, single episode, unspecified: Secondary | ICD-10-CM

## 2013-07-02 NOTE — Progress Notes (Addendum)
Patient:  Megan Gibbs   DOB: 20-May-1951  MR Number: [id number]  Location: [facility]:  [address] [place],  [place], [address]  Start: Thursday 7/3//2014 9:00 AM End: Thursday 7/3//2014 9:50 AM  Provider/Observer:     Florencia Reasons, MSW, LCSW   Chief Complaint:      Chief Complaint  Patient presents with  . Anxiety    Reason For Service:     The patient was referred for services by primary care physician Dr. Syliva Overman due to the patient experiencing symptoms of depression with intermittent suicidal ideation. Patient is seen today for follow up appointment.  Interventions Strategy:  Supportive therapy, cognitive behavioral therapy, psychoeducation  Participation Level:   Active  Participation Quality:  Appropriate      Behavioral Observation:  Fairly Groomed, Alert, talkative  Current Psychosocial Factors:   Content of Session:   Reviewing symptoms, processing feelings, identifying ways to increase daily structure and routine, identifying ways to improve self-care regarding nutrition and exercise and discuss and effects on mood, reviewing relaxation techniques  Current Status:   The patient reports continued decreased sadness but increased anxiety.  Patient Progress:   Good. Patient reports continued improvement in mood but increased anxiety including having two panic attacks since last session. Triggers include continue nervousness about driving in the area around her home as she has to travel a country road with lots of curves. She also reports stress related to trying to find another house. Patient reports change in eating patterns consuming increased carbohydrates. Therapist works with patient to review relaxation techniques and to identify ways to improve self-care regarding nutrition as well as physical activity. Patient agrees to use handouts to her in daily routine to achieve balance. She also agrees to bring handouts to next session. Patient expresses  some concern about mother's recent negative comments about patient's visit during Mother's Day. However, patient is not overwhelmed by these comments and has been able to successfully set and maintain boundaries with mother.   Target Goals:   1. Resume normal interest in activities. 2. Improved mood, decrease anxiety, and decrease panic attacks. 3. Decrease negative thought patterns.  Last Reviewed:     Goals Addressed Today:    Improve mood, decrease anxiety  Impression/Diagnosis:   The patient reports experiencing symptoms of anxiety and depression since February 2010. Symptoms appear to have been triggered by patient's losing her job  and and back surgery in 2009. Patient has continued to experience recurrent periods of depression and ongoing anxiety.  Diagnosis:  Axis I:  MDD (major depressive disorder)          Axis II: Deferred

## 2013-07-02 NOTE — Patient Instructions (Signed)
Discussed orally 

## 2013-07-30 ENCOUNTER — Ambulatory Visit (HOSPITAL_COMMUNITY): Payer: Self-pay | Admitting: Psychiatry

## 2013-08-07 ENCOUNTER — Ambulatory Visit (INDEPENDENT_AMBULATORY_CARE_PROVIDER_SITE_OTHER): Payer: Medicare Other | Admitting: Psychiatry

## 2013-08-07 ENCOUNTER — Other Ambulatory Visit: Payer: Self-pay | Admitting: Family Medicine

## 2013-08-07 DIAGNOSIS — Z139 Encounter for screening, unspecified: Secondary | ICD-10-CM

## 2013-08-07 DIAGNOSIS — F329 Major depressive disorder, single episode, unspecified: Secondary | ICD-10-CM

## 2013-08-07 NOTE — Patient Instructions (Signed)
Discussed orally 

## 2013-08-07 NOTE — Progress Notes (Signed)
Patient:  Megan Gibbs   DOB: 1951-08-04  MR Number: [id number]  Location: [facility]:  [address] [place],  [place], [address]  Start: Friday 08/07/2013 10:00 AM End: Friday 08/07/2013 10:50 AM  Provider/Observer:     Florencia Reasons, MSW, LCSW   Chief Complaint:      Chief Complaint  Patient presents with  . Anxiety  . Depression    Reason For Service:     The patient was referred for services by primary care physician Dr. Syliva Overman due to the patient experiencing symptoms of depression with intermittent suicidal ideation. Patient is seen today for follow up appointment.  Interventions Strategy:  Supportive therapy, cognitive behavioral therapy, psychoeducation  Participation Level:   Active  Participation Quality:  Appropriate      Behavioral Observation:  Fairly Groomed, Alert, talkative, pleasant  Current Psychosocial Factors:   Content of Session:   Reviewing symptoms, processing feelings, reinforcing patient's efforts to increase daily structure and routine andimprove self-care, reviewing relaxation techniques  Current Status:   The patient reports improved mood and decreased anxiety along with improved sleep pattern.  Patient Progress:   Good. Patient reports continued improved mood and decreased anxiety. She has been busy looking for housing. She has resumed interest in reading and continues to attend church regularly as well as socialize with family and friends. She continues to experience nervousness when driving at times but has been able to use coping statements to reduce anxiety. Patient reports having recent positive contact with mother and is hopeful about improving their relationship. Patient has been able to continue to successfully set and maintain boundaries..  Target Goals:   1. Resume normal interest in activities. 2. Improved mood, decrease anxiety, and decrease panic attacks. 3. Decrease negative thought patterns.  Last  Reviewed:     Goals Addressed Today:    Improve mood, decrease anxiety  Impression/Diagnosis:   The patient reports experiencing symptoms of anxiety and depression since February 2010. Symptoms appear to have been triggered by patient's losing her job  and and back surgery in 2009. Patient has continued to experience recurrent periods of depression and ongoing anxiety.  Diagnosis:  Axis I:  MDD (major depressive disorder)          Axis II: Deferred

## 2013-08-18 ENCOUNTER — Ambulatory Visit (INDEPENDENT_AMBULATORY_CARE_PROVIDER_SITE_OTHER): Payer: Medicare Other | Admitting: Psychiatry

## 2013-08-18 ENCOUNTER — Ambulatory Visit (HOSPITAL_COMMUNITY): Payer: Self-pay | Admitting: Psychiatry

## 2013-08-18 ENCOUNTER — Encounter (HOSPITAL_COMMUNITY): Payer: Self-pay | Admitting: Psychiatry

## 2013-08-18 VITALS — BP 120/78 | Ht 61.0 in | Wt 112.0 lb

## 2013-08-18 DIAGNOSIS — F418 Other specified anxiety disorders: Secondary | ICD-10-CM

## 2013-08-18 DIAGNOSIS — F411 Generalized anxiety disorder: Secondary | ICD-10-CM

## 2013-08-18 DIAGNOSIS — F329 Major depressive disorder, single episode, unspecified: Secondary | ICD-10-CM

## 2013-08-18 MED ORDER — MIRTAZAPINE 15 MG PO TBDP: 15.0000 mg | ORAL_TABLET | Freq: Every day | ORAL | Status: DC

## 2013-08-18 MED ORDER — FLUOXETINE HCL 20 MG PO TABS: 30.0000 mg | ORAL_TABLET | Freq: Every day | ORAL | Status: DC
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
## 2013-08-27 ENCOUNTER — Ambulatory Visit (HOSPITAL_COMMUNITY)
Admission: RE | Admit: 2013-08-27 | Discharge: 2013-08-27 | Disposition: A | Discharge status: Home / Self Care | Payer: Medicare Other | Source: Ambulatory Visit | Attending: Family Medicine | Admitting: Family Medicine

## 2013-08-27 ENCOUNTER — Ambulatory Visit (HOSPITAL_COMMUNITY): Payer: Self-pay

## 2013-08-27 DIAGNOSIS — Z139 Encounter for screening, unspecified: Secondary | ICD-10-CM

## 2013-08-27 DIAGNOSIS — Z1231 Encounter for screening mammogram for malignant neoplasm of breast: Secondary | ICD-10-CM | POA: Insufficient documentation

## 2013-09-03 ENCOUNTER — Other Ambulatory Visit: Payer: Self-pay | Admitting: Family Medicine

## 2013-09-03 DIAGNOSIS — R928 Other abnormal and inconclusive findings on diagnostic imaging of breast: Secondary | ICD-10-CM

## 2013-09-04 ENCOUNTER — Ambulatory Visit (HOSPITAL_COMMUNITY): Payer: Self-pay | Admitting: Psychiatry

## 2013-09-09 ENCOUNTER — Ambulatory Visit (HOSPITAL_COMMUNITY)
Admission: RE | Admit: 2013-09-09 | Discharge: 2013-09-09 | Disposition: A | Discharge status: Home / Self Care | Payer: Medicare Other | Source: Ambulatory Visit | Attending: Family Medicine | Admitting: Family Medicine

## 2013-09-09 DIAGNOSIS — R928 Other abnormal and inconclusive findings on diagnostic imaging of breast: Secondary | ICD-10-CM

## 2013-09-14 ENCOUNTER — Ambulatory Visit (INDEPENDENT_AMBULATORY_CARE_PROVIDER_SITE_OTHER): Payer: Medicare Other | Admitting: Psychiatry

## 2013-09-14 DIAGNOSIS — F329 Major depressive disorder, single episode, unspecified: Secondary | ICD-10-CM

## 2013-09-14 NOTE — Patient Instructions (Signed)
Discussed orally 

## 2013-09-14 NOTE — Progress Notes (Signed)
Patient:  Megan Gibbs   DOB: November 10, 1951  MR Number: [id number]  Location: [facility]:  [address] [place],  [place], [address]  Start: Monday 09/14/2013 10:00 AM End: Monday 09/14/2013 10:50 AM  Provider/Observer:     Florencia Reasons, MSW, LCSW   Chief Complaint:      Chief Complaint  Patient presents with  . Anxiety    Reason For Service:     The patient was referred for services by primary care physician Dr. Syliva Overman due to the patient experiencing symptoms of depression with intermittent suicidal ideation. Patient is seen today for follow up appointment.  Interventions Strategy:  Supportive therapy, cognitive behavioral   Participation Level:   Active  Participation Quality:  Appropriate      Behavioral Observation:  Fairly Groomed, Alert, talkative, pleasant  Current Psychosocial Factors:   Content of Session:   Reviewing symptoms, processing feelings, reinforcing patient's efforts to improve self-care and to set and maintain boundaries, discussing possible termination at next session  Current Status:   The patient reports  continued improved mood and decreased anxiety along with improved sleep pattern.  Patient Progress:   Good. Patient reports continued improved mood and decreased anxiety. She has moved to [facility] and is very pleased with her new home.  The location is convenient as it is near patient's relatives and places where patient normally shops and does errands. She has continued to improve structure and routine maintaining involvement in activity  including attending church, exercising, and socializing with family and friends. She also continues to successfully set and maintain boundaries with her mother and other family members. Patient reports no longer experiencing nervousness while driving since moving and no longer having to travel a winding and narrow road. Patient continues to use coping techniques and to rely heavily on her spirituality and  faith.  Target Goals:   1. Resume normal interest in activities. 2. Improved mood, decrease anxiety, and decrease panic attacks. 3. Decrease negative thought patterns.  Last Reviewed:     Goals Addressed Today:    Improve mood, decrease anxiety  Impression/Diagnosis:   The patient reports experiencing symptoms of anxiety and depression since February 2010. Symptoms appear to have been triggered by patient's losing her job  and and back surgery in 2009. Patient has continued to experience recurrent periods of depression and ongoing anxiety.  Diagnosis:  Axis I:  MDD (major depressive disorder)          Axis II: Deferred

## 2013-10-29 ENCOUNTER — Encounter: Payer: Self-pay | Admitting: Family Medicine

## 2013-10-29 ENCOUNTER — Encounter (INDEPENDENT_AMBULATORY_CARE_PROVIDER_SITE_OTHER): Payer: Self-pay

## 2013-10-29 ENCOUNTER — Ambulatory Visit (INDEPENDENT_AMBULATORY_CARE_PROVIDER_SITE_OTHER): Payer: Medicare Other | Admitting: Family Medicine

## 2013-10-29 VITALS — BP 120/78 | HR 63 | Resp 16 | Wt 109.0 lb

## 2013-10-29 DIAGNOSIS — Z23 Encounter for immunization: Secondary | ICD-10-CM

## 2013-10-29 DIAGNOSIS — G4700 Insomnia, unspecified: Secondary | ICD-10-CM

## 2013-10-29 DIAGNOSIS — F411 Generalized anxiety disorder: Secondary | ICD-10-CM

## 2013-10-29 DIAGNOSIS — R7309 Other abnormal glucose: Secondary | ICD-10-CM

## 2013-10-29 DIAGNOSIS — E785 Hyperlipidemia, unspecified: Secondary | ICD-10-CM

## 2013-10-29 DIAGNOSIS — Z139 Encounter for screening, unspecified: Secondary | ICD-10-CM

## 2013-10-29 DIAGNOSIS — R7303 Prediabetes: Secondary | ICD-10-CM

## 2013-10-29 DIAGNOSIS — Z1211 Encounter for screening for malignant neoplasm of colon: Secondary | ICD-10-CM

## 2013-10-29 DIAGNOSIS — M810 Age-related osteoporosis without current pathological fracture: Secondary | ICD-10-CM

## 2013-10-29 DIAGNOSIS — R5381 Other malaise: Secondary | ICD-10-CM

## 2013-10-29 DIAGNOSIS — J309 Allergic rhinitis, unspecified: Secondary | ICD-10-CM

## 2013-10-29 DIAGNOSIS — F329 Major depressive disorder, single episode, unspecified: Secondary | ICD-10-CM

## 2013-10-29 MED ORDER — ALENDRONATE SODIUM 70 MG PO TABS: 70.0000 mg | ORAL_TABLET | ORAL | Status: AC

## 2013-10-29 NOTE — Patient Instructions (Addendum)
Annual wellness in May , call if you need me before   Fasting lipid, chem 7, TSH, vit D, CBC, HBA1C  Today    Flu vaccine today  Discuss panic attacks, which started in July, and increased anxiety with your psychiatrist, who will treat you for that

## 2013-10-29 NOTE — Progress Notes (Signed)
  Subjective:    Patient ID: Megan Gibbs, female    DOB: Nov 20, 1951, 62 y.o.   MRN: [id number]  HPI The PT is here for follow up and re-evaluation of chronic medical conditions, medication management and review of any available recent lab and radiology data.  Preventive health is updated, specifically  Cancer screening and Immunization.   Follows regularly with psychiatry and therapy.  Recently has developed panic attacks which are debilitating enough to prevent her from driving, needs to further address with psych, has appt in next 1 month The PT denies any adverse reactions to current medications since the last visit.         Review of Systems See HPI Denies recent fever or chills. Denies sinus pressure, nasal congestion, ear pain or sore throat. Denies chest congestion, productive cough or wheezing. Denies chest pains, palpitations and leg swelling Denies abdominal pain, nausea, vomiting,diarrhea or constipation.   Denies dysuria, frequency, hesitancy or incontinence. Denies joint pain, swelling and limitation in mobility. Denies headaches, seizures, numbness, or tingling. Denies uncontrolled depression, or insomnia. Denies skin break down or rash.        Objective:   Physical Exam  Patient alert and oriented and in no cardiopulmonary distress.  HEENT: No facial asymmetry, EOMI, no sinus tenderness,  oropharynx pink and moist.  Neck supple no adenopathy.  Chest: Clear to auscultation bilaterally.  CVS: S1, S2 no murmurs, no S3.  ABD: Soft non tender. Bowel sounds normal.  Ext: No edema  MS: Adequate ROM spine, shoulders, hips and knees.  Skin: Intact, no ulcerations or rash noted.  Psych: Good eye contact, normal affect. Memory intact mildly anxious not depressed appearing.  CNS: CN 2-12 intact, power, tone and sensation normal throughout.       Assessment & Plan:

## 2013-11-01 NOTE — Assessment & Plan Note (Signed)
Uncontrolled, reports panic attacks associated with driving in the past approx 6 month, hardly ever drives as a result, need to address with psych

## 2013-11-01 NOTE — Assessment & Plan Note (Signed)
Marked improvement , treated by psych, denies hallucinations, suicidal or homicidal ideation

## 2013-11-01 NOTE — Assessment & Plan Note (Signed)
Improved on current meds through psych

## 2013-11-01 NOTE — Assessment & Plan Note (Signed)
Continue treatment, regular physical activity with weight bearing encouraged

## 2013-11-01 NOTE — Assessment & Plan Note (Signed)
Controlled, no change in medication  

## 2013-11-01 NOTE — Assessment & Plan Note (Signed)
Updated lab due Hyperlipidemia:Low fat diet discussed and encouraged.

## 2013-11-01 NOTE — Assessment & Plan Note (Signed)
Patient educated about the importance of limiting  Carbohydrate intake , the need to commit to daily physical activity for a minimum of 30 minutes , and to commit weight loss. The fact that changes in all these areas will reduce or eliminate all together the development of diabetes is stressed.   Updated lab needed 

## 2013-11-02 ENCOUNTER — Ambulatory Visit (INDEPENDENT_AMBULATORY_CARE_PROVIDER_SITE_OTHER): Payer: Medicare Other | Admitting: Psychiatry

## 2013-11-02 ENCOUNTER — Other Ambulatory Visit: Payer: Self-pay | Admitting: Family Medicine

## 2013-11-02 DIAGNOSIS — F329 Major depressive disorder, single episode, unspecified: Secondary | ICD-10-CM

## 2013-11-02 NOTE — Patient Instructions (Signed)
Discussed orally 

## 2013-11-02 NOTE — Progress Notes (Signed)
Patient:  Megan Gibbs   DOB: 10-Feb-1951  MR Number: [id number]  Location: [facility]:  [address] [place],  [place], [address]  Start: Monday 11/02/2013 9:00 AM End: Monday 11/02/2013 9:50 AM  Provider/Observer:     Florencia Reasons, MSW, LCSW   Chief Complaint:      Chief Complaint  Patient presents with  . Anxiety    Reason For Service:     The patient was referred for services by primary care physician Dr. Syliva Overman due to the patient experiencing symptoms of depression with intermittent suicidal ideation. Patient is seen today for follow up appointment.  Interventions Strategy:  Supportive therapy, cognitive behavioral   Participation Level:   Active  Participation Quality:  Appropriate      Behavioral Observation:  Fairly Groomed, Alert, talkative, pleasant  Current Psychosocial Factors:   Content of Session:   Reviewing symptoms, processing feelings, reinforcing patient's efforts to improve self-care and to set and maintain boundaries, termination  Current Status:   The patient reports  continued improved mood and decreased anxiety along with improved sleep pattern.  Patient Progress:   Good. Patient reports continued improved mood and decreased anxiety. She hasn't had any panic attacks since last session. She has continued to improve structure and routine maintaining involvement in activity  including attending church, exercising, and socializing with family and friends. She also continues to successfully set and maintain boundaries with her mother and other family members. Patient continues to use coping techniques and to rely heavily on her spirituality and faith. Her review patient's progress and agree that patient has accomplished her goals. Therefore, psychotherapy services will be discontinued at this time. Patient is encouraged to contact this practice should she need psychotherapy in the future. She will continue to see psychiatrist Dr. Tenny Craw for  medication management.  Target Goals:   1. Resume normal interest in activities. 2. Improved mood, decrease anxiety, and decrease panic attacks. 3. Decrease negative thought patterns.  Last Reviewed:     Goals Addressed Today:    Improve mood, decrease anxiety  Impression/Diagnosis:   The patient reports experiencing symptoms of anxiety and depression since February 2010. Symptoms appear to have been triggered by patient's losing her job  and and back surgery in 2009. Patient has continued to experience recurrent periods of depression and ongoing anxiety.  Diagnosis:  Axis I:  MDD (major depressive disorder)          Axis II: Deferred           Outpatient Therapist Discharge Summary  [name]    05-Apr-1951   Admission Date:  08/04/2009    Discharge Date:  11/02/2013  Reason for Discharge:  Treatment completed Diagnosis:  Axis I:  MDD (major depressive disorder)   Axis V: 71-80  Comments:    [name] LCSW

## 2013-11-03 LAB — LIPID PANEL
Cholesterol: 185 mg/dL (ref 0–200)
HDL: 62 mg/dL (ref >39)
Triglycerides: 59 mg/dL (ref <150)

## 2013-11-03 LAB — CBC WITH DIFFERENTIAL/PLATELET
Basophils Absolute: 0 10*3/uL (ref 0.0–0.1)
Basophils Relative: 1 % (ref 0–1)
Eosinophils Relative: 1 % (ref 0–5)
HCT: 35.8 % — ABNORMAL LOW (ref 36.0–46.0)
MCHC: 32.4 g/dL (ref 30.0–36.0)
MCV: 82.1 fL (ref 78.0–100.0)
Monocytes Absolute: 0.2 10*3/uL (ref 0.1–1.0)
RDW: 14.1 % (ref 11.5–15.5)

## 2013-11-03 LAB — BASIC METABOLIC PANEL
BUN: 15 mg/dL (ref 6–23)
CO2: 27 mEq/L (ref 19–32)
Calcium: 9.7 mg/dL (ref 8.4–10.5)
Glucose, Bld: 85 mg/dL (ref 70–99)
Sodium: 144 mEq/L (ref 135–145)

## 2013-11-03 LAB — VITAMIN D 25 HYDROXY (VIT D DEFICIENCY, FRACTURES): Vit D, 25-Hydroxy: 23 ng/mL — ABNORMAL LOW (ref 30–89)

## 2013-11-05 ENCOUNTER — Telehealth: Payer: Self-pay

## 2013-11-05 NOTE — Telephone Encounter (Signed)
Pt referred by Dr. Lodema Hong for screening colonoscopy. Last one was 01/28/2004 by [facility] and next was recommended in 10 years. I called pt and she said she is not having any GI problems at this time. I will have her on my recall for scheduling in Jan 2015 for next colonoscopy. Pt is aware.

## 2013-11-10 ENCOUNTER — Other Ambulatory Visit: Payer: Self-pay | Admitting: Family Medicine

## 2013-11-10 ENCOUNTER — Other Ambulatory Visit: Payer: Self-pay

## 2013-11-10 MED ORDER — ERGOCALCIFEROL 1.25 MG (50000 UT) PO CAPS: 50000.0000 [IU] | ORAL_CAPSULE | ORAL | Status: DC

## 2013-11-13 NOTE — Telephone Encounter (Signed)
Thank you, noted.

## 2013-11-16 ENCOUNTER — Ambulatory Visit (INDEPENDENT_AMBULATORY_CARE_PROVIDER_SITE_OTHER): Payer: Medicare Other | Admitting: Psychiatry

## 2013-11-16 ENCOUNTER — Encounter (HOSPITAL_COMMUNITY): Payer: Self-pay | Admitting: Psychiatry

## 2013-11-16 VITALS — BP 120/70 | Ht 61.0 in | Wt 107.0 lb

## 2013-11-16 DIAGNOSIS — F411 Generalized anxiety disorder: Secondary | ICD-10-CM

## 2013-11-16 DIAGNOSIS — F329 Major depressive disorder, single episode, unspecified: Secondary | ICD-10-CM

## 2013-11-16 MED ORDER — FLUOXETINE HCL 20 MG PO TABS: 30.0000 mg | ORAL_TABLET | Freq: Every day | ORAL | Status: DC

## 2013-11-16 MED ORDER — MIRTAZAPINE 15 MG PO TBDP: 15.0000 mg | ORAL_TABLET | Freq: Every day | ORAL | Status: DC
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
## 2013-12-16 ENCOUNTER — Telehealth: Payer: Self-pay | Admitting: Family Medicine

## 2013-12-16 NOTE — Telephone Encounter (Signed)
Does the weekly vitamin d need to be refilled or has she completed the course?

## 2013-12-16 NOTE — Telephone Encounter (Signed)
Patient aware she is to take it for 6 months total

## 2014-01-04 ENCOUNTER — Telehealth: Payer: Self-pay

## 2014-01-04 NOTE — Telephone Encounter (Signed)
I called pt to schedule her colonoscopy. She said she has something going on now and she will call when she is ready.

## 2014-02-15 ENCOUNTER — Ambulatory Visit (HOSPITAL_COMMUNITY): Payer: Self-pay | Admitting: Psychiatry

## 2014-03-01 ENCOUNTER — Encounter (HOSPITAL_COMMUNITY): Payer: Self-pay | Admitting: Psychiatry

## 2014-03-01 ENCOUNTER — Ambulatory Visit (INDEPENDENT_AMBULATORY_CARE_PROVIDER_SITE_OTHER): Payer: Medicare Other | Admitting: Psychiatry

## 2014-03-01 VITALS — BP 98/70 | Ht 61.0 in | Wt 108.0 lb

## 2014-03-01 DIAGNOSIS — F329 Major depressive disorder, single episode, unspecified: Secondary | ICD-10-CM

## 2014-03-01 DIAGNOSIS — F3289 Other specified depressive episodes: Secondary | ICD-10-CM

## 2014-03-01 DIAGNOSIS — F411 Generalized anxiety disorder: Secondary | ICD-10-CM

## 2014-03-01 MED ORDER — MIRTAZAPINE 15 MG PO TBDP: 15.0000 mg | ORAL_TABLET | Freq: Every day | ORAL | Status: DC

## 2014-03-01 MED ORDER — FLUOXETINE HCL 20 MG PO TABS: 30.0000 mg | ORAL_TABLET | Freq: Every day | ORAL | Status: DC
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
## 2014-03-12 ENCOUNTER — Telehealth: Payer: Self-pay

## 2014-03-15 NOTE — Telephone Encounter (Signed)
noted 

## 2014-04-05 ENCOUNTER — Telehealth: Payer: Self-pay

## 2014-04-05 NOTE — Telephone Encounter (Signed)
Gastroenterology Pre-Procedure Review  Request Date:  Requesting Physician: Moshe Cipro  PATIENT REVIEW QUESTIONS: The patient responded to the following health history questions as indicated:    1. Diabetes Melitis: NO 2. Joint replacements in the past 12 months: NO 3. Major health problems in the past 3 months: NO 4. Has an artificial valve or MVP: NO 5. Has a defibrillator: NO 6. Has been advised in past to take antibiotics in advance of a procedure like teeth cleaning: NO 7: Family Hx colon cancer: NO 8: Acholic: NO      MEDICATIONS & ALLERGIES:    Patient reports the following regarding taking any blood thinners:   Plavix? NO Aspirin? YES Coumadin? NO  Patient confirms/reports the following medications:  Current Outpatient Prescriptions  Medication Sig Dispense Refill  . alendronate (FOSAMAX) 70 MG tablet Take 1 tablet (70 mg total) by mouth every 7 (seven) days. Take with a full glass of water on an empty stomach.  4 tablet  11  . aspirin (ASPIRIN LOW DOSE) 81 MG EC tablet Take 81 mg by mouth daily.        . calcium-vitamin D (OSCAL WITH D) 500-200 MG-UNIT per tablet Take 1 tablet by mouth daily with breakfast.      . Cetirizine HCl 10 MG TBDP Take 10 mg by mouth daily.      . ferrous sulfate 325 (65 FE) MG tablet Take 325 mg by mouth daily with breakfast.      . FLUoxetine (PROZAC) 20 MG tablet Take 40 mg by mouth daily.      . mirtazapine (REMERON SOL-TAB) 15 MG disintegrating tablet Take 15 mg by mouth at bedtime.      . Multiple Vitamin (MULTIVITAMIN) capsule Take 1 capsule by mouth daily.      . ergocalciferol (VITAMIN D2) 50000 UNITS capsule Take 1 capsule (50,000 Units total) by mouth once a week. One capsule once weekly  12 capsule  1   No current facility-administered medications for this visit.    Patient confirms/reports the following allergies:  Allergies  Allergen Reactions  . Pineapple Hives    No orders of the defined types were placed in this encounter.    AUTHORIZATION INFORMATION Primary Insurance: [employer]  ID #: [id number]  Group #:[id number] Pre-Cert / Josem Kaufmann required:  Pre-Cert / Josem Kaufmann #:   Secondary Insurance: Medicare  ID #: [id number] Group #:  Pre-Cert / Josem Kaufmann required:  Pre-Cert / Auth #:   SCHEDULE INFORMATION: Procedure has been scheduled as follows:  Date: , Time:  Location:   This Gastroenterology Pre-Precedure Review Form is being routed to the following provider(s):

## 2014-04-06 ENCOUNTER — Other Ambulatory Visit: Payer: Self-pay

## 2014-04-06 ENCOUNTER — Encounter (HOSPITAL_COMMUNITY): Payer: Self-pay | Admitting: Pharmacy Technician

## 2014-04-06 DIAGNOSIS — Z1211 Encounter for screening for malignant neoplasm of colon: Secondary | ICD-10-CM

## 2014-04-06 NOTE — Telephone Encounter (Signed)
I called pt and she is scheduled for Screening colonoscopy with Dr. Gala Romney on 04/19/2014 at 9:30 AM. Her last one was done by Dr. Gala Romney on 01/28/2004.

## 2014-04-07 NOTE — Telephone Encounter (Signed)
OK to schedule

## 2014-04-12 MED ORDER — PEG-KCL-NACL-NASULF-NA ASC-C 100 G PO SOLR: 1.0000 | ORAL | Status: DC

## 2014-04-12 NOTE — Telephone Encounter (Signed)
Rx sent to the pharmacy and instructions mailed to pt.  

## 2014-04-19 ENCOUNTER — Encounter (HOSPITAL_COMMUNITY): Payer: Self-pay | Admitting: *Deleted

## 2014-04-19 ENCOUNTER — Encounter (HOSPITAL_COMMUNITY)
Admission: RE | Disposition: A | Discharge status: Home / Self Care | Payer: Self-pay | Source: Ambulatory Visit | Attending: Internal Medicine

## 2014-04-19 ENCOUNTER — Telehealth: Payer: Self-pay

## 2014-04-19 ENCOUNTER — Ambulatory Visit (HOSPITAL_COMMUNITY)
Admission: RE | Admit: 2014-04-19 | Discharge: 2014-04-19 | Disposition: A | Discharge status: Home / Self Care | Payer: Medicare Other | Source: Ambulatory Visit | Attending: Internal Medicine | Admitting: Internal Medicine

## 2014-04-19 DIAGNOSIS — Z79899 Other long term (current) drug therapy: Secondary | ICD-10-CM | POA: Insufficient documentation

## 2014-04-19 DIAGNOSIS — F411 Generalized anxiety disorder: Secondary | ICD-10-CM | POA: Insufficient documentation

## 2014-04-19 DIAGNOSIS — K648 Other hemorrhoids: Secondary | ICD-10-CM | POA: Insufficient documentation

## 2014-04-19 DIAGNOSIS — Z1211 Encounter for screening for malignant neoplasm of colon: Secondary | ICD-10-CM | POA: Insufficient documentation

## 2014-04-19 DIAGNOSIS — Z7982 Long term (current) use of aspirin: Secondary | ICD-10-CM | POA: Insufficient documentation

## 2014-04-19 HISTORY — DX: Anemia, unspecified: D64.9

## 2014-04-19 HISTORY — PX: COLONOSCOPY: SHX5424

## 2014-04-19 SURGERY — COLONOSCOPY
Anesthesia: Moderate Sedation

## 2014-04-19 MED ORDER — MEPERIDINE HCL 100 MG/ML IJ SOLN
INTRAMUSCULAR | Status: AC
  Filled 2014-04-19: qty 2

## 2014-04-19 MED ORDER — ONDANSETRON HCL 4 MG/2ML IJ SOLN
INTRAMUSCULAR | Status: DC | PRN
  Administered 2014-04-19: 4 mg via INTRAVENOUS

## 2014-04-19 MED ORDER — SODIUM CHLORIDE 0.9 % IV SOLN
INTRAVENOUS | Status: DC
  Administered 2014-04-19: 09:00:00 via INTRAVENOUS

## 2014-04-19 MED ORDER — MIDAZOLAM HCL 5 MG/5ML IJ SOLN
INTRAMUSCULAR | Status: AC
  Filled 2014-04-19: qty 10

## 2014-04-19 MED ORDER — MEPERIDINE HCL 100 MG/ML IJ SOLN
INTRAMUSCULAR | Status: DC | PRN
  Administered 2014-04-19: 50 mg via INTRAVENOUS

## 2014-04-19 MED ORDER — STERILE WATER FOR IRRIGATION IR SOLN
Status: DC | PRN
  Administered 2014-04-19: 09:00:00

## 2014-04-19 MED ORDER — MIDAZOLAM HCL 5 MG/5ML IJ SOLN
INTRAMUSCULAR | Status: DC | PRN
  Administered 2014-04-19: 2 mg via INTRAVENOUS
  Administered 2014-04-19 (×2): 1 mg via INTRAVENOUS

## 2014-04-19 MED ORDER — ONDANSETRON HCL 4 MG/2ML IJ SOLN
INTRAMUSCULAR | Status: AC
  Filled 2014-04-19: qty 2

## 2014-04-19 NOTE — Op Note (Addendum)
[facility] [address] [place], [address]   COLONOSCOPY PROCEDURE REPORT  PATIENT: Megan, Gibbs  MR#:         [id number] BIRTHDATE: June 19, 1951 , 40  yrs. old GENDER: Female ENDOSCOPIST: R.  Garfield Cornea, MD FACP FACG REFERRED BY:  Tula Nakayama, M.D. PROCEDURE DATE:  04/19/2014 PROCEDURE:     Screening colonoscopy  INDICATIONS: Average risk colorectal cancer screening examination  INFORMED CONSENT:  The risks, benefits, alternatives and imponderables including but not limited to bleeding, perforation as well as the possibility of a missed lesion have been reviewed.  The potential for biopsy, lesion removal, etc. have also been discussed.  Questions have been answered.  All parties agreeable. Please see the history and physical in the medical record for more information.  MEDICATIONS: Versed 4 mg IV and Demerol 50 mg IV in divided doses. Zofran 4 mg IV  DESCRIPTION OF PROCEDURE:  After a digital rectal exam was performed, the [id number] ([id number])  colonoscope was advanced from the anus through the rectum and colon to the area of the cecum, ileocecal valve and appendiceal orifice.  The cecum was deeply intubated.  These structures were well-seen and photographed for the record.  From the level of the cecum and ileocecal valve, the scope was slowly and cautiously withdrawn.  The mucosal surfaces were carefully surveyed utilizing scope tip deflection to facilitate fold flattening as needed.  The scope was pulled down into the rectum where a thorough examination including retroflexion was performed.    FINDINGS:  Adequate preparation. Anal papilla internal hemorrhoids; otherwise normal rectum. Normal-appearing colonic mucosa.  THERAPEUTIC / DIAGNOSTIC MANEUVERS PERFORMED:  none  COMPLICATIONS: none  CECAL WITHDRAWAL TIME:  12 minutes  IMPRESSION:  Anal papilla/hemorrhoids; otherwise normal colonoscopy  RECOMMENDATIONS: Repeat screening  examination in 10 years.   _______________________________ eSigned:  R. Garfield Cornea, MD FACP [facility] 05/03/2014 11:16 AM Revised: 05/03/2014 11:16 AM  CC:

## 2014-04-19 NOTE — Discharge Instructions (Signed)
Colonoscopy Discharge Instructions  Read the instructions outlined below and refer to this sheet in the next few weeks. These discharge instructions provide you with general information on caring for yourself after you leave the hospital. Your doctor may also give you specific instructions. While your treatment has been planned according to the most current medical practices available, unavoidable complications occasionally occur. If you have any problems or questions after discharge, call Dr. Gala Romney at [phone]. ACTIVITY  You may resume your regular activity, but move at a slower pace for the next 24 hours.   Take frequent rest periods for the next 24 hours.   Walking will help get rid of the air and reduce the bloated feeling in your belly (abdomen).   No driving for 24 hours (because of the medicine (anesthesia) used during the test).    Do not sign any important legal documents or operate any machinery for 24 hours (because of the anesthesia used during the test).  NUTRITION  Drink plenty of fluids.   You may resume your normal diet as instructed by your doctor.   Begin with a light meal and progress to your normal diet. Heavy or fried foods are harder to digest and may make you feel sick to your stomach (nauseated).   Avoid alcoholic beverages for 24 hours or as instructed.  MEDICATIONS  You may resume your normal medications unless your doctor tells you otherwise.  WHAT YOU CAN EXPECT TODAY  Some feelings of bloating in the abdomen.   Passage of more gas than usual.   Spotting of blood in your stool or on the toilet paper.  IF YOU HAD POLYPS REMOVED DURING THE COLONOSCOPY:  No aspirin products for 7 days or as instructed.   No alcohol for 7 days or as instructed.   Eat a soft diet for the next 24 hours.  FINDING OUT THE RESULTS OF YOUR TEST Not all test results are available during your visit. If your test results are not back during the visit, make an appointment  with your caregiver to find out the results. Do not assume everything is normal if you have not heard from your caregiver or the medical facility. It is important for you to follow up on all of your test results.  SEEK IMMEDIATE MEDICAL ATTENTION IF:  You have more than a spotting of blood in your stool.   Your belly is swollen (abdominal distention).   You are nauseated or vomiting.   You have a temperature over 101.   You have abdominal pain or discomfort that is severe or gets worse throughout the day.    Repeat screening colonoscopy in 10 yearsHemorrhoids Hemorrhoids are swollen veins around the rectum or anus. There are two types of hemorrhoids:  Internal hemorrhoids. These occur in the veins just inside the rectum. They may poke through to the outside and become irritated and painful. External hemorrhoids. These occur in the veins outside the anus and can be felt as a painful swelling or hard lump near the anus. CAUSES Pregnancy.  Obesity.  Constipation or diarrhea.  Straining to have a bowel movement.  Sitting for long periods on the toilet. Heavy lifting or other activity that caused you to strain. Anal intercourse. SYMPTOMS  Pain.  Anal itching or irritation.  Rectal bleeding.  Fecal leakage.  Anal swelling.  One or more lumps around the anus.  DIAGNOSIS  Your caregiver may be able to diagnose hemorrhoids by visual examination. Other examinations or tests that may be  performed include:  Examination of the rectal area with a gloved hand (digital rectal exam).  Examination of anal canal using a small tube (scope).  A blood test if you have lost a significant amount of blood. A test to look inside the colon (sigmoidoscopy or colonoscopy). TREATMENT Most hemorrhoids can be treated at home. However, if symptoms do not seem to be getting better or if you have a lot of rectal bleeding, your caregiver may perform a procedure to help make the hemorrhoids get  smaller or remove them completely. Possible treatments include:  Placing a rubber band at the base of the hemorrhoid to cut off the circulation (rubber band ligation).  Injecting a chemical to shrink the hemorrhoid (sclerotherapy).  Using a tool to burn the hemorrhoid (infrared light therapy).  Surgically removing the hemorrhoid (hemorrhoidectomy).  Stapling the hemorrhoid to block blood flow to the tissue (hemorrhoid stapling).  HOME CARE INSTRUCTIONS  Eat foods with fiber, such as whole grains, beans, nuts, fruits, and vegetables. Ask your doctor about taking products with added fiber in them (fibersupplements). Increase fluid intake. Drink enough water and fluids to keep your urine clear or pale yellow.  Exercise regularly.  Go to the bathroom when you have the urge to have a bowel movement. Do not wait.  Avoid straining to have bowel movements.  Keep the anal area dry and clean. Use wet toilet paper or moist towelettes after a bowel movement.  Medicated creams and suppositories may be used or applied as directed.  Only take over-the-counter or prescription medicines as directed by your caregiver.  Take warm sitz baths for 15 20 minutes, 3 4 times a day to ease pain and discomfort.  Place ice packs on the hemorrhoids if they are tender and swollen. Using ice packs between sitz baths may be helpful.  Put ice in a plastic bag.  Place a towel between your skin and the bag.  Leave the ice on for 15 20 minutes, 3 4 times a day.  Do not use a donut-shaped pillow or sit on the toilet for long periods. This increases blood pooling and pain.  SEEK MEDICAL CARE IF: You have increasing pain and swelling that is not controlled by treatment or medicine. You have uncontrolled bleeding. You have difficulty or you are unable to have a bowel movement. You have pain or inflammation outside the area of the hemorrhoids. MAKE SURE YOU: Understand these instructions. Will watch your  condition. Will get help right away if you are not doing well or get worse. Document Released: 12/14/2000 Document Revised: 12/03/2012 Document Reviewed: 10/21/2012 [facility] Patient Information 2014 [place].

## 2014-04-19 NOTE — H&P (Signed)
Primary Care Physician:  Tula Nakayama, MD Primary Gastroenterologist:  Dr. Gala Romney  Pre-Procedure History & Physical: HPI:  Megan Gibbs is a 63 y.o. female is here for a screening colonoscopy. No bowel symptoms. Negative colonoscopy 10 years ago. No family history of colon cancer. Ms. read colonoscopy prep instructions. Only took half of MOVIE PREP.  Clear liquids x2 and half days.  Past Medical History  Diagnosis Date  . Hypokalemia   . Allergic rhinitis   . Back pain   . Vertigo   . Depression   . Anxiety   . Anemia     Past Surgical History  Procedure Laterality Date  . Back surgery  1992 & 2009    Dr. Joya Salm   . Tubal ligation      1976    Prior to Admission medications   Medication Sig Start Date End Date Taking? Authorizing Provider  alendronate (FOSAMAX) 70 MG tablet Take 1 tablet (70 mg total) by mouth every 7 (seven) days. Take with a full glass of water on an empty stomach. 10/29/13 10/29/14 Yes Fayrene Helper, MD  aspirin (ASPIRIN LOW DOSE) 81 MG EC tablet Take 81 mg by mouth daily.     Yes Historical Provider, MD  calcium-vitamin D (OSCAL WITH D) 500-200 MG-UNIT per tablet Take 1 tablet by mouth daily with breakfast.   Yes Fayrene Helper, MD  Cetirizine HCl 10 MG TBDP Take 10 mg by mouth daily. 03/31/13  Yes Fayrene Helper, MD  ferrous sulfate 325 (65 FE) MG tablet Take 325 mg by mouth daily with breakfast.   Yes Historical Provider, MD  FLUoxetine (PROZAC) 20 MG tablet Take 40 mg by mouth daily. 03/01/14  Yes Levonne Spiller, MD  mirtazapine (REMERON SOL-TAB) 15 MG disintegrating tablet Take 15 mg by mouth at bedtime.   Yes Levonne Spiller, MD  Multiple Vitamins-Minerals (MULTIVITAMIN) LIQD Take 30 mLs by mouth 3 (three) times daily.   Yes Historical Provider, MD  peg 3350 powder (MOVIPREP) 100 G SOLR Take 1 kit (200 g total) by mouth as directed. 04/12/14  Yes Daneil Dolin, MD    Allergies as of 04/06/2014  . (No Active Allergies)    Family History   Problem Relation Age of Onset  . Diabetes Mother   . Hypertension Mother   . Asthma Sister   . Thyroid disease Sister   . Diabetes Sister   . Hypertension Sister   . Schizophrenia Father   . Anxiety disorder Maternal Aunt   . Drug abuse Brother   . Alcohol abuse Brother   . Sleep apnea Grandchild   . ADD / ADHD Grandchild   . Seizures Grandchild   . Colon cancer Neg Hx     History   Social History  . Marital Status: Legally Separated    Spouse Name: N/A    Number of Children: 3  . Years of Education: N/A   Occupational History  . disabled     Social History Main Topics  . Smoking status: Never Smoker   . Smokeless tobacco: Never Used  . Alcohol Use: No  . Drug Use: No  . Sexual Activity: No   Other Topics Concern  . Not on file   Social History Narrative  . No narrative on file    Review of Systems: See HPI, otherwise negative ROS  Physical Exam: BP 138/76  Pulse 73  Temp(Src) 97.8 F (36.6 C) (Oral)  Resp 22  Ht _0  (1.549 m)  Wt 114 lb (51.71 kg)  BMI 21.55 kg/m2  SpO2 100% General:   Alert,  Well-developed, well-nourished, pleasant and cooperative in NAD Head:  Normocephalic and atraumatic. Eyes:  Sclera clear, no icterus.   Conjunctiva pink. Ears:  Normal auditory acuity. Nose:  No deformity, discharge,  or lesions. Mouth:  No deformity or lesions, dentition normal. Neck:  Supple; no masses or thyromegaly. Lungs:  Clear throughout to auscultation.   No wheezes, crackles, or rhonchi. No acute distress. Heart:  Regular rate and rhythm; no murmurs, clicks, rubs,  or gallops. Abdomen:  Soft, nontender and nondistended. No masses, hepatosplenomegaly or hernias noted. Normal bowel sounds, without guarding, and without rebound.   Msk:  Symmetrical without gross deformities. Normal posture. Pulses:  Normal pulses noted. Extremities:  Without clubbing or edema. Impression/Plan:   Megan Gibbs is now here to undergo a screening  colonoscopy.  Risks, benefits, limitations, imponderables and alternatives regarding colonoscopy have been reviewed with the patient. Questions have been answered. All parties agreeable.

## 2014-04-19 NOTE — Telephone Encounter (Signed)
Per Karen Chafe, PA not required for screening colonoscopy.

## 2014-04-22 ENCOUNTER — Encounter (HOSPITAL_COMMUNITY): Payer: Self-pay | Admitting: Internal Medicine

## 2014-05-03 ENCOUNTER — Encounter: Payer: Self-pay | Admitting: Family Medicine

## 2014-05-03 ENCOUNTER — Encounter (INDEPENDENT_AMBULATORY_CARE_PROVIDER_SITE_OTHER): Payer: Self-pay

## 2014-05-03 ENCOUNTER — Ambulatory Visit (INDEPENDENT_AMBULATORY_CARE_PROVIDER_SITE_OTHER): Payer: Medicare Other | Admitting: Family Medicine

## 2014-05-03 VITALS — BP 108/78 | HR 77 | Resp 16 | Ht 61.0 in | Wt 111.4 lb

## 2014-05-03 DIAGNOSIS — Z0000 Encounter for general adult medical examination without abnormal findings: Secondary | ICD-10-CM

## 2014-05-03 DIAGNOSIS — E785 Hyperlipidemia, unspecified: Secondary | ICD-10-CM

## 2014-05-03 DIAGNOSIS — E559 Vitamin D deficiency, unspecified: Secondary | ICD-10-CM

## 2014-05-03 DIAGNOSIS — Z13820 Encounter for screening for osteoporosis: Secondary | ICD-10-CM

## 2014-05-03 DIAGNOSIS — R5383 Other fatigue: Secondary | ICD-10-CM

## 2014-05-03 DIAGNOSIS — R7303 Prediabetes: Secondary | ICD-10-CM

## 2014-05-03 DIAGNOSIS — R5381 Other malaise: Secondary | ICD-10-CM

## 2014-05-03 NOTE — Progress Notes (Signed)
Subjective:    Patient ID: Megan Gibbs, female    DOB: [birth year], 63 y.o.   MRN: [id number]  HPI  Preventive Screening-Counseling & Management   Patient present here today for a subsequent  Medicare annual wellness visit. No health concerns expressed or addressed at this visit.   Current Problems (verified)   Medications Prior to Visit Allergies (verified)   PAST HISTORY  Family History reviewed  Social History : Divorced twice, most recent is approx 4 years ago. mother of 3 adult daughters, no drug use ever   Risk Factors  Current exercise habits:  Needs to commit to daily physical activity for at least 30 minute every day, no regular exercise routine currently  Dietary issues discussed:low fat heart healthy diet, increase fruit and vegetable   Cardiac risk factors: none significant  Depression Screen  (Note: if answer to either of the following is "Yes", a more complete depression screening is indicated)   Over the past two weeks, have you felt down, depressed or hopeless? No  Over the past two weeks, have you felt little interest or pleasure in doing things? No  Have you lost interest or pleasure in daily life? No  Do you often feel hopeless? No  Do you cry easily over simple problems? No  Treated for depression by psychiatry, well controlled and doing well Activities of Daily Living  In your present state of health, do you have any difficulty performing the following activities?  Driving?: No, had been bothered with panic attacks limiting driving last year, but this has resolved Managing money?: No Feeding yourself?:No Getting from bed to chair?:No Climbing a flight of stairs?:No Preparing food and eating?:No Bathing or showering?:No Getting dressed?:No Getting to the toilet?:No Using the toilet?:No Moving around from place to place?: No  Fall Risk Assessment In the past year have you fallen or had a near fall?:No Are you currently taking any medications  that make you dizzy?:No   Hearing Difficulties: No Do you often ask people to speak up or repeat themselves?:No Do you experience ringing or noises in your ears?:No Do you have difficulty understanding soft or whispered voices?:No  Cognitive Testing  Alert? Yes Normal Appearance?Yes  Oriented to person? Yes Place? Yes  Time? Yes  Displays appropriate judgment?Yes  Can read the correct time from a watch face? yes Are you having problems remembering things?No  Advanced Directives have been discussed with the patient?Yes , full code,  Info provided on living will   List the Names of Other Physician/Practitioners you currently use: Dr Harrington Challenger, Sydell Axon, dr's [facility]   Indicate any recent Medical Services you may have received from other than [facility] providers in the past year (date may be approximate).   Assessment:    Annual Wellness Exam   Plan:    t.  Medicare Attestation  I have personally reviewed:  The patient's medical and social history  Their use of alcohol, tobacco or illicit drugs  Their current medications and supplements  The patient's functional ability including ADLs,fall risks, home safety risks, cognitive, and hearing and visual impairment  Diet and physical activities  Evidence for depression or mood disorders  The patient's weight, height, BMI, and visual acuity have been recorded in the chart. I have made referrals, counseling, and provided education to the patient based on review of the above and I have provided the patient with a written personalized care plan for preventive services.     Review of Systems     Objective:  Physical Exam        Assessment & Plan:  Medicare annual wellness visit, subsequent Annual exam as documented. Counseling done  re healthy lifestyle involving commitment to 150 minutes exercise per week, heart healthy diet, and attaining healthy weight.The importance of adequate sleep also discussed. Regular seat belt use and safe  storage  of firearms if patient has them, is also discussed. Changes in health habits are decided on by the patient with goals and time frames  set for achieving them. Immunization and cancer screening needs are specifically addressed at this visit.

## 2014-05-03 NOTE — Patient Instructions (Addendum)
F/u in 5 month, call if you need me before  Fasting lipid, chem 7, hBa1C , Vit , cBC in 5 month, before f/u visit  You are referred for bone density test  Please commit to physical activity daily for heart health  You will get info on advanced directives to follow up on

## 2014-05-10 ENCOUNTER — Ambulatory Visit (HOSPITAL_COMMUNITY)
Admission: RE | Admit: 2014-05-10 | Discharge: 2014-05-10 | Disposition: A | Discharge status: Home / Self Care | Payer: Medicare Other | Source: Ambulatory Visit | Attending: Family Medicine | Admitting: Family Medicine

## 2014-05-10 DIAGNOSIS — Z13820 Encounter for screening for osteoporosis: Secondary | ICD-10-CM

## 2014-05-10 DIAGNOSIS — M810 Age-related osteoporosis without current pathological fracture: Secondary | ICD-10-CM | POA: Insufficient documentation

## 2014-05-11 ENCOUNTER — Other Ambulatory Visit (HOSPITAL_COMMUNITY): Payer: Self-pay

## 2014-05-12 ENCOUNTER — Encounter: Payer: Self-pay | Admitting: Family Medicine

## 2014-05-14 ENCOUNTER — Other Ambulatory Visit: Payer: Self-pay

## 2014-05-14 DIAGNOSIS — M810 Age-related osteoporosis without current pathological fracture: Secondary | ICD-10-CM

## 2014-07-01 ENCOUNTER — Ambulatory Visit (HOSPITAL_COMMUNITY): Payer: Self-pay | Admitting: Psychiatry

## 2014-07-05 ENCOUNTER — Ambulatory Visit (INDEPENDENT_AMBULATORY_CARE_PROVIDER_SITE_OTHER): Payer: Medicare Other | Admitting: Psychiatry

## 2014-07-05 ENCOUNTER — Encounter (HOSPITAL_COMMUNITY): Payer: Self-pay | Admitting: Psychiatry

## 2014-07-05 VITALS — BP 110/80 | Ht 61.0 in | Wt 113.0 lb

## 2014-07-05 DIAGNOSIS — F329 Major depressive disorder, single episode, unspecified: Secondary | ICD-10-CM

## 2014-07-05 DIAGNOSIS — F321 Major depressive disorder, single episode, moderate: Secondary | ICD-10-CM

## 2014-07-05 MED ORDER — FLUOXETINE HCL 20 MG PO TABS: 40.0000 mg | ORAL_TABLET | Freq: Every day | ORAL | Status: DC

## 2014-07-05 MED ORDER — MIRTAZAPINE 15 MG PO TBDP: 15.0000 mg | ORAL_TABLET | Freq: Every day | ORAL | Status: DC
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
## 2014-07-25 ENCOUNTER — Encounter: Payer: Self-pay | Admitting: Family Medicine

## 2014-07-25 DIAGNOSIS — Z0000 Encounter for general adult medical examination without abnormal findings: Secondary | ICD-10-CM | POA: Insufficient documentation

## 2014-07-25 NOTE — Assessment & Plan Note (Signed)
Annual exam as documented. Counseling done  re healthy lifestyle involving commitment to 150 minutes exercise per week, heart healthy diet, and attaining healthy weight.The importance of adequate sleep also discussed. Regular seat belt use and safe storage  of firearms if patient has them, is also discussed. Changes in health habits are decided on by the patient with goals and time frames  set for achieving them. Immunization and cancer screening needs are specifically addressed at this visit.  

## 2014-08-17 ENCOUNTER — Other Ambulatory Visit: Payer: Self-pay | Admitting: Family Medicine

## 2014-08-17 DIAGNOSIS — Z1231 Encounter for screening mammogram for malignant neoplasm of breast: Secondary | ICD-10-CM

## 2014-08-20 ENCOUNTER — Encounter: Payer: Self-pay | Admitting: Obstetrics & Gynecology

## 2014-08-20 ENCOUNTER — Other Ambulatory Visit: Payer: Self-pay | Admitting: Obstetrics & Gynecology

## 2014-08-20 ENCOUNTER — Ambulatory Visit (INDEPENDENT_AMBULATORY_CARE_PROVIDER_SITE_OTHER): Payer: Medicare Other | Admitting: Obstetrics & Gynecology

## 2014-08-20 VITALS — BP 120/80 | Wt 112.0 lb

## 2014-08-20 DIAGNOSIS — N9089 Other specified noninflammatory disorders of vulva and perineum: Secondary | ICD-10-CM

## 2014-08-20 DIAGNOSIS — L821 Other seborrheic keratosis: Secondary | ICD-10-CM

## 2014-08-20 DIAGNOSIS — A630 Anogenital (venereal) warts: Secondary | ICD-10-CM

## 2014-08-26 ENCOUNTER — Ambulatory Visit: Payer: Self-pay | Admitting: Obstetrics & Gynecology

## 2014-09-10 ENCOUNTER — Ambulatory Visit (HOSPITAL_COMMUNITY)
Admission: RE | Admit: 2014-09-10 | Discharge: 2014-09-10 | Disposition: A | Discharge status: Home / Self Care | Payer: Medicare Other | Source: Ambulatory Visit | Attending: Family Medicine | Admitting: Family Medicine

## 2014-09-10 DIAGNOSIS — Z1231 Encounter for screening mammogram for malignant neoplasm of breast: Secondary | ICD-10-CM | POA: Diagnosis present

## 2014-09-13 ENCOUNTER — Other Ambulatory Visit: Payer: Self-pay | Admitting: Family Medicine

## 2014-09-13 DIAGNOSIS — R928 Other abnormal and inconclusive findings on diagnostic imaging of breast: Secondary | ICD-10-CM

## 2014-09-28 NOTE — Progress Notes (Signed)
Patient ID: Megan Gibbs, female   DOB: 05/30/1951, 63 y.o.   MRN: [id number] Seborrheic keratosis on right vulva isolated Prepped 1% lidocaine is injected Wide excision performed 3 0 ethilon sutures placed  Sent to path  Follow up prn

## 2014-10-05 ENCOUNTER — Other Ambulatory Visit: Payer: Self-pay | Admitting: Family Medicine

## 2014-10-05 ENCOUNTER — Ambulatory Visit (HOSPITAL_COMMUNITY)
Admission: RE | Admit: 2014-10-05 | Discharge: 2014-10-05 | Disposition: A | Discharge status: Home / Self Care | Payer: Medicare Other | Source: Ambulatory Visit | Attending: Family Medicine | Admitting: Family Medicine

## 2014-10-05 ENCOUNTER — Ambulatory Visit (HOSPITAL_COMMUNITY): Payer: Self-pay | Admitting: Psychiatry

## 2014-10-05 DIAGNOSIS — R928 Other abnormal and inconclusive findings on diagnostic imaging of breast: Secondary | ICD-10-CM | POA: Insufficient documentation

## 2014-10-05 DIAGNOSIS — Z803 Family history of malignant neoplasm of breast: Secondary | ICD-10-CM | POA: Insufficient documentation

## 2014-10-05 DIAGNOSIS — R921 Mammographic calcification found on diagnostic imaging of breast: Secondary | ICD-10-CM

## 2014-10-05 LAB — CBC WITH DIFFERENTIAL/PLATELET
Basophils Absolute: 0 10*3/uL (ref 0.0–0.1)
Basophils Relative: 0 % (ref 0–1)
Eosinophils Absolute: 0.1 10*3/uL (ref 0.0–0.7)
Eosinophils Relative: 1 % (ref 0–5)
HEMATOCRIT: 36.9 % (ref 36.0–46.0)
HEMOGLOBIN: 11.8 g/dL — AB (ref 12.0–15.0)
LYMPHS ABS: 2.9 10*3/uL (ref 0.7–4.0)
LYMPHS PCT: 56 % — AB (ref 12–46)
MCH: 25.9 pg — ABNORMAL LOW (ref 26.0–34.0)
MCHC: 32 g/dL (ref 30.0–36.0)
MCV: 81.1 fL (ref 78.0–100.0)
MONO ABS: 0.4 10*3/uL (ref 0.1–1.0)
Monocytes Relative: 7 % (ref 3–12)
NEUTROS ABS: 1.8 10*3/uL (ref 1.7–7.7)
Neutrophils Relative %: 36 % — ABNORMAL LOW (ref 43–77)
Platelets: 278 10*3/uL (ref 150–400)
RBC: 4.55 MIL/uL (ref 3.87–5.11)
RDW: 14.6 % (ref 11.5–15.5)
WBC: 5.1 10*3/uL (ref 4.0–10.5)

## 2014-10-05 LAB — BASIC METABOLIC PANEL
BUN: 15 mg/dL (ref 6–23)
CHLORIDE: 107 meq/L (ref 96–112)
CO2: 26 mEq/L (ref 19–32)
Calcium: 9.3 mg/dL (ref 8.4–10.5)
Creat: 0.79 mg/dL (ref 0.50–1.10)
GLUCOSE: 90 mg/dL (ref 70–99)
POTASSIUM: 3.8 meq/L (ref 3.5–5.3)
Sodium: 143 mEq/L (ref 135–145)

## 2014-10-05 LAB — LIPID PANEL
Cholesterol: 184 mg/dL (ref 0–200)
HDL: 60 mg/dL (ref >39)
LDL Cholesterol: 103 mg/dL — ABNORMAL HIGH (ref 0–99)
Total CHOL/HDL Ratio: 3.1 Ratio
Triglycerides: 105 mg/dL (ref <150)
VLDL: 21 mg/dL (ref 0–40)

## 2014-10-05 LAB — HEMOGLOBIN A1C
Hgb A1c MFr Bld: 6.1 % — ABNORMAL HIGH (ref <5.7)
Mean Plasma Glucose: 128 mg/dL — ABNORMAL HIGH (ref <117)

## 2014-10-06 LAB — VITAMIN D 25 HYDROXY (VIT D DEFICIENCY, FRACTURES): Vit D, 25-Hydroxy: 52 ng/mL (ref 30–89)

## 2014-10-11 ENCOUNTER — Encounter: Payer: Self-pay | Admitting: Family Medicine

## 2014-10-11 ENCOUNTER — Ambulatory Visit (INDEPENDENT_AMBULATORY_CARE_PROVIDER_SITE_OTHER): Payer: Medicare Other | Admitting: Family Medicine

## 2014-10-11 VITALS — BP 118/80 | HR 72 | Resp 16 | Wt 110.0 lb

## 2014-10-11 DIAGNOSIS — F341 Dysthymic disorder: Secondary | ICD-10-CM

## 2014-10-11 DIAGNOSIS — Z833 Family history of diabetes mellitus: Secondary | ICD-10-CM

## 2014-10-11 DIAGNOSIS — Z803 Family history of malignant neoplasm of breast: Secondary | ICD-10-CM | POA: Insufficient documentation

## 2014-10-11 DIAGNOSIS — F418 Other specified anxiety disorders: Secondary | ICD-10-CM

## 2014-10-11 DIAGNOSIS — E785 Hyperlipidemia, unspecified: Secondary | ICD-10-CM

## 2014-10-11 DIAGNOSIS — F5105 Insomnia due to other mental disorder: Secondary | ICD-10-CM

## 2014-10-11 DIAGNOSIS — R7303 Prediabetes: Secondary | ICD-10-CM | POA: Insufficient documentation

## 2014-10-11 DIAGNOSIS — Z23 Encounter for immunization: Secondary | ICD-10-CM | POA: Insufficient documentation

## 2014-10-11 DIAGNOSIS — R7302 Impaired glucose tolerance (oral): Secondary | ICD-10-CM

## 2014-10-11 DIAGNOSIS — M810 Age-related osteoporosis without current pathological fracture: Secondary | ICD-10-CM

## 2014-10-11 NOTE — Assessment & Plan Note (Signed)
Controlled on current meds and with therapy, continue management through mental health

## 2014-10-11 NOTE — Assessment & Plan Note (Signed)
Has biopsy in am due to calcifications seen on recent mammogram

## 2014-10-11 NOTE — Assessment & Plan Note (Signed)
Vaccine administered at visit.  

## 2014-10-11 NOTE — Progress Notes (Signed)
   Subjective:    Patient ID: Megan Gibbs, female    DOB: 1951/06/27, 63 y.o.   MRN: [id number]  HPI The PT is here for follow up and re-evaluation of chronic medical conditions, medication management and review of any available recent lab and radiology data.  Preventive health is updated, specifically  Cancer screening and Immunization.  Needs flu vaccine and has breast biopsy in am, therefore has some increased anxiety Questions or concerns regarding consultations or procedures which the PT has had in the interim are  addressed. The PT denies any adverse reactions to current medications since the last visit. States not really needing/ taking sleep med, but wants nmed for anxiety, will discuss with psych       Review of Systems See HPI Denies recent fever or chills. Denies sinus pressure, nasal congestion, ear pain or sore throat. Denies chest congestion, productive cough or wheezing. Denies chest pains, palpitations and leg swelling Denies abdominal pain, nausea, vomiting,diarrhea or constipation.   Denies dysuria, frequency, hesitancy or incontinence. Denies joint pain, swelling and limitation in mobility. Denies headaches, seizures, numbness, or tingling. Denies depression,  or insomnia.c/o increased anxiety Denies skin break down or rash.        Objective:   Physical Exam  BP 118/80  Pulse 72  Resp 16  Wt 110 lb (49.896 kg)  SpO2 100% Patient alert and oriented and in no cardiopulmonary distress.  HEENT: No facial asymmetry, EOMI,   oropharynx pink and moist.  Neck supple no JVD, no mass.  Chest: Clear to auscultation bilaterally.  CVS: S1, S2 no murmurs, no S3.Regular rate.  ABD: Soft non tender.   Ext: No edema  MS: Adequate ROM spine, shoulders, hips and knees.  Skin: Intact, no ulcerations or rash noted.  Psych: Good eye contact, normal affect. Memory intact not anxious or depressed appearing.  CNS: CN 2-12 intact, power,  normal throughout.no focal  deficits noted.       Assessment & Plan:  IGT (impaired glucose tolerance) Deteriorated Patient educated about the importance of limiting  Carbohydrate intake , the need to commit to daily physical activity for a minimum of 30 minutes , and to commit weight loss. The fact that changes in all these areas will reduce or eliminate all together the development of diabetes is stressed.   Updated lab needed at/ before next visit.   Insomnia secondary to depression with anxiety Reports uses restril very infrequently, more interested in acute anxiety med, I have advised her to discuss with psych  DEPRESSION, SEVERE Controlled on current meds and with therapy, continue management through mental health  Osteoporosis Pt to continue weekly fosamax, I have encouraged her to get half of her calcium by food rather than in tablet form  FH: breast cancer in first degree relative Has biopsy in am due to calcifications seen on recent mammogram  Need for prophylactic vaccination and inoculation against influenza Vaccine administered at visit.

## 2014-10-11 NOTE — Assessment & Plan Note (Signed)
Reports uses restril very infrequently, more interested in acute anxiety med, I have advised her to discuss with psych

## 2014-10-11 NOTE — Assessment & Plan Note (Signed)
Deteriorated Patient educated about the importance of limiting  Carbohydrate intake , the need to commit to daily physical activity for a minimum of 30 minutes , and to commit weight loss. The fact that changes in all these areas will reduce or eliminate all together the development of diabetes is stressed.   \Updated lab needed at/ before next visit.  

## 2014-10-11 NOTE — Patient Instructions (Addendum)
F/u in 4.5 month, call if you need m before  Flu  vaccine today  Cut back on sweet and egg yolks, bad cholesterol is better, but still a little too high, blood sugar is now averaging 6.1, and 6.5 repeated is diabetes. 5.6 is normal  Take calcium one daily, coommit to daily weight bearing exercise to help with bone health  Fasting lipid, HBA1C in 4.5 month  Discuss sleeping pill and anxiety med with Dr Harrington Challenger

## 2014-10-11 NOTE — Assessment & Plan Note (Addendum)
Pt to continue weekly fosamax, I have encouraged her to get half of her calcium by food rather than in tablet form

## 2014-10-12 ENCOUNTER — Ambulatory Visit
Admission: RE | Admit: 2014-10-12 | Discharge: 2014-10-12 | Disposition: A | Discharge status: Home / Self Care | Payer: Medicare Other | Source: Ambulatory Visit | Attending: Family Medicine | Admitting: Family Medicine

## 2014-10-12 ENCOUNTER — Encounter (INDEPENDENT_AMBULATORY_CARE_PROVIDER_SITE_OTHER): Payer: Self-pay

## 2014-10-12 DIAGNOSIS — R921 Mammographic calcification found on diagnostic imaging of breast: Secondary | ICD-10-CM

## 2014-10-12 DIAGNOSIS — R928 Other abnormal and inconclusive findings on diagnostic imaging of breast: Secondary | ICD-10-CM

## 2014-11-16 ENCOUNTER — Other Ambulatory Visit (INDEPENDENT_AMBULATORY_CARE_PROVIDER_SITE_OTHER): Payer: Self-pay | Admitting: General Surgery

## 2014-11-16 DIAGNOSIS — D241 Benign neoplasm of right breast: Secondary | ICD-10-CM

## 2014-11-24 ENCOUNTER — Encounter (HOSPITAL_BASED_OUTPATIENT_CLINIC_OR_DEPARTMENT_OTHER): Payer: Self-pay | Admitting: *Deleted

## 2014-11-24 NOTE — Progress Notes (Signed)
Pt to come in for labs after seeds done 12/1 Daughter will be with her Had her write down info-she has a cold, but taking meds-if not better next week-call dr Audie Pinto office

## 2014-11-29 NOTE — H&P (Signed)
Megan Gibbs  Location: [facility] Patient #: [id number] DOB: December 13, 1951 Divorced / Language: Cleophus Molt / Race: Black or African American Female       History of Present Illness  Patient words: eval right breast.  The patient is a 63 year old female who presents with a complaint of papilloma of right breast, subareolar. This is a 63 year old African American female, referred to me by Dr. Pamelia Hoit at the breast center of [facility] for evaluation and surgical management of a right breast, subareolar intraductal papilloma. Her PCP is Dr. Tula Nakayama. The patient has no prior history of any breast problems. Recent mammograms show a 4 mm area of grouped calcifications in the right breast subareolar area. Thought to be suspicious. Image guided biopsy shows intraductal Papilloma with calcifications. She was referred. Family history reveals breast cancer in a sister and a first cousin. No ovarian cancer. Comorbidities include moderately severe anxiety depression on Remeron. Otherwise healthy. The patient is here with her daughter who is present throughout the encounter. She agrees to proceed with right breast lumpectomy with radioactive seed localization. I have discussed the indications, details, techniques, and numerous risk of the surgery with the patient and her daughter. She is aware the risks of bleeding, infection, further surgery if cancer, cosmetic deformity, skin necrosis, nerve damage with chronic pain or numbness. She understands all of these issues. All questions are answered. She agrees with this plan.   Other Problems  Arthritis  Past Surgical History  Breast Biopsy Right. Gallbladder Surgery - Laparoscopic Spinal Surgery - Lower Back  Diagnostic Studies History Mammogram within last year Pap Smear 1-5 years ago  Allergies  No Known Drug Allergies11/09/2014  Medication History  Alendronate Sodium (70MG  Tablet, Oral) Active. FLUoxetine  HCl (20MG  Capsule, Oral) Active. Aspirin (81MG  Tablet, Oral) Active. Calcium Ascorbate (500MG  Tablet, Oral) Active.  Social History Caffeine use Carbonated beverages, Tea. No alcohol use Tobacco use Never smoker.  Family History Marjean Donna, [place]; 11/09/2014 2:50 PM) Diabetes Mellitus Mother, Sister. Hypertension Brother, Mother, Sister.  Pregnancy / Birth History Age at menarche 59 years. Age of menopause 33-50 Gravida 3 Maternal age 65-20 Para 3  Review of Systems General Not Present- Appetite Loss, Chills, Fatigue, Fever, Night Sweats, Weight Gain and Weight Loss. Skin Not Present- Change in Wart/Mole, Dryness, Hives, Jaundice, New Lesions, Non-Healing Wounds, Rash and Ulcer. HEENT Present- Seasonal Allergies, Sinus Pain and Wears glasses/contact lenses. Not Present- Earache, Hearing Loss, Hoarseness, Nose Bleed, Oral Ulcers, Ringing in the Ears, Sore Throat, Visual Disturbances and Yellow Eyes. Respiratory Not Present- Bloody sputum, Chronic Cough, Difficulty Breathing, Snoring and Wheezing. Breast Not Present- Breast Mass, Breast Pain, Nipple Discharge and Skin Changes. Cardiovascular Present- Leg Cramps. Not Present- Chest Pain, Difficulty Breathing Lying Down, Palpitations, Rapid Heart Rate, Shortness of Breath and Swelling of Extremities. Gastrointestinal Not Present- Abdominal Pain, Bloating, Bloody Stool, Change in Bowel Habits, Chronic diarrhea, Constipation, Difficulty Swallowing, Excessive gas, Gets full quickly at meals, Hemorrhoids, Indigestion, Nausea, Rectal Pain and Vomiting. Female Genitourinary Not Present- Frequency, Nocturia, Painful Urination, Pelvic Pain and Urgency. Musculoskeletal Present- Back Pain. Not Present- Joint Pain, Joint Stiffness, Muscle Pain, Muscle Weakness and Swelling of Extremities. Neurological Not Present- Decreased Memory, Fainting, Headaches, Numbness, Seizures, Tingling, Tremor, Trouble walking and Weakness. Psychiatric Not  Present- Anxiety, Bipolar, Change in Sleep Pattern, Depression, Fearful and Frequent crying. Endocrine Not Present- Cold Intolerance, Excessive Hunger, Hair Changes, Heat Intolerance, Hot flashes and New Diabetes. Hematology Not Present- Easy Bruising, Excessive bleeding, Gland problems, HIV and  Persistent Infections.   Vitals 11/09/2014 2:51 PM Weight: 105 lb Height: 61in Body Surface Area: 1.43 m Body Mass Index: 19.84 kg/m Temp.: 92F(Temporal)  Pulse: 73 (Regular)  BP: 126/74 (Sitting, Left Arm, Standard)    Physical Exam General Mental Status-Alert. General Appearance-Consistent with stated age. Hydration-Well hydrated. Voice-Normal.  Head and Neck Head-normocephalic, atraumatic with no lesions or palpable masses. Trachea-midline. Thyroid Gland Characteristics - normal size and consistency.  Eye Eyeball - Bilateral-Extraocular movements intact. Sclera/Conjunctiva - Bilateral-No scleral icterus.  Chest and Lung Exam Chest and lung exam reveals -quiet, even and easy respiratory effort with no use of accessory muscles and on auscultation, normal breath sounds, no adventitious sounds and normal vocal resonance. Inspection Chest Wall - Normal. Back - normal.  Breast Breast - Left-Symmetric, Non Tender, No Biopsy scars, no Dimpling, No Inflammation, No Lumpectomy scars, No Mastectomy scars, No Peau d' Orange. Breast - Right-Symmetric and Biopsy scar, Non Tender, No Dimpling, No Inflammation, No Lumpectomy scars, No Mastectomy scars, No Peau d' Orange. Breast Lump-No Palpable Breast Mass. Note: Small biopsy puncture mark right breast, 3 o'clock position. Nipple and areola complex looked normal. No drainage. No palpable mass. Left breast exam unremarkable. No axillary mass on either side.   Cardiovascular Cardiovascular examination reveals -normal heart sounds, regular rate and rhythm with no murmurs and normal pedal pulses  bilaterally.  Abdomen Inspection Inspection of the abdomen reveals - No Hernias. Skin - Scar - no surgical scars. Palpation/Percussion Palpation and Percussion of the abdomen reveal - Soft, Non Tender, No Rebound tenderness, No Rigidity (guarding) and No hepatosplenomegaly. Auscultation Auscultation of the abdomen reveals - Bowel sounds normal.  Neurologic Neurologic evaluation reveals -alert and oriented x 3 with no impairment of recent or remote memory. Mental Status-Normal.  Musculoskeletal Normal Exam - Left-Upper Extremity Strength Normal and Lower Extremity Strength Normal. Normal Exam - Right-Upper Extremity Strength Normal and Lower Extremity Strength Normal.  Lymphatic Head & Neck  General Head & Neck Lymphatics: Bilateral - Description - Normal. Axillary  General Axillary Region: Bilateral - Description - Normal. Tenderness - Non Tender. Femoral & Inguinal  Generalized Femoral & Inguinal Lymphatics: Bilateral - Description - Normal. Tenderness - Non Tender.    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 The biopsy shows intraductal papilloma with calcifications. This is probably benign, but there is a small chance that there may be breast cancer. You have a family history of breast cancer, and this increases your risk. you will be scheduled for right breast lumpectomy with radioactive seed localization in the near future. We have discussed the techniques and risks of the surgery. ANXIETY AND DEPRESSION (300.00  F41.9) FAMILY HISTORY OF BREAST CANCER (V16.3  Z80.3)    Edsel Petrin. Dalbert Batman, M.D., [facility] General and Minimally invasive Surgery Breast and Colorectal Surgery Office:   [phone] Pager:   [phone]

## 2014-11-30 ENCOUNTER — Encounter (HOSPITAL_BASED_OUTPATIENT_CLINIC_OR_DEPARTMENT_OTHER)
Admission: RE | Admit: 2014-11-30 | Discharge: 2014-11-30 | Disposition: A | Discharge status: Home / Self Care | Payer: Medicare Other | Source: Ambulatory Visit | Attending: General Surgery | Admitting: General Surgery

## 2014-11-30 ENCOUNTER — Ambulatory Visit
Admission: RE | Admit: 2014-11-30 | Discharge: 2014-11-30 | Disposition: A | Discharge status: Home / Self Care | Payer: Medicare Other | Source: Ambulatory Visit | Attending: General Surgery | Admitting: General Surgery

## 2014-11-30 DIAGNOSIS — F329 Major depressive disorder, single episode, unspecified: Secondary | ICD-10-CM | POA: Diagnosis not present

## 2014-11-30 DIAGNOSIS — Z803 Family history of malignant neoplasm of breast: Secondary | ICD-10-CM | POA: Diagnosis not present

## 2014-11-30 DIAGNOSIS — F419 Anxiety disorder, unspecified: Secondary | ICD-10-CM | POA: Diagnosis not present

## 2014-11-30 DIAGNOSIS — M1990 Unspecified osteoarthritis, unspecified site: Secondary | ICD-10-CM | POA: Diagnosis not present

## 2014-11-30 DIAGNOSIS — D241 Benign neoplasm of right breast: Secondary | ICD-10-CM | POA: Diagnosis not present

## 2014-11-30 LAB — COMPREHENSIVE METABOLIC PANEL
ALT: 15 U/L (ref 0–35)
AST: 19 U/L (ref 0–37)
Albumin: 4.3 g/dL (ref 3.5–5.2)
Alkaline Phosphatase: 73 U/L (ref 39–117)
Anion gap: 11 (ref 5–15)
BUN: 10 mg/dL (ref 6–23)
CALCIUM: 9.5 mg/dL (ref 8.4–10.5)
CO2: 28 mEq/L (ref 19–32)
Chloride: 104 mEq/L (ref 96–112)
Creatinine, Ser: 0.71 mg/dL (ref 0.50–1.10)
GFR calc non Af Amer: 90 mL/min — ABNORMAL LOW (ref >90)
GLUCOSE: 93 mg/dL (ref 70–99)
Potassium: 3.5 mEq/L — ABNORMAL LOW (ref 3.7–5.3)
Sodium: 143 mEq/L (ref 137–147)
TOTAL PROTEIN: 7.4 g/dL (ref 6.0–8.3)
Total Bilirubin: 0.2 mg/dL — ABNORMAL LOW (ref 0.3–1.2)

## 2014-11-30 LAB — CBC WITH DIFFERENTIAL/PLATELET
Basophils Absolute: 0 10*3/uL (ref 0.0–0.1)
Basophils Relative: 0 % (ref 0–1)
EOS ABS: 0 10*3/uL (ref 0.0–0.7)
EOS PCT: 0 % (ref 0–5)
HCT: 36.6 % (ref 36.0–46.0)
HEMOGLOBIN: 11.5 g/dL — AB (ref 12.0–15.0)
LYMPHS ABS: 1.7 10*3/uL (ref 0.7–4.0)
Lymphocytes Relative: 26 % (ref 12–46)
MCH: 25.7 pg — AB (ref 26.0–34.0)
MCHC: 31.4 g/dL (ref 30.0–36.0)
MCV: 81.7 fL (ref 78.0–100.0)
MONOS PCT: 9 % (ref 3–12)
Monocytes Absolute: 0.6 10*3/uL (ref 0.1–1.0)
Neutro Abs: 4.3 10*3/uL (ref 1.7–7.7)
Neutrophils Relative %: 65 % (ref 43–77)
Platelets: 285 10*3/uL (ref 150–400)
RBC: 4.48 MIL/uL (ref 3.87–5.11)
RDW: 14.2 % (ref 11.5–15.5)
WBC: 6.6 10*3/uL (ref 4.0–10.5)

## 2014-12-01 ENCOUNTER — Ambulatory Visit (HOSPITAL_BASED_OUTPATIENT_CLINIC_OR_DEPARTMENT_OTHER): Payer: Medicare Other | Admitting: Certified Registered"

## 2014-12-01 ENCOUNTER — Ambulatory Visit (HOSPITAL_BASED_OUTPATIENT_CLINIC_OR_DEPARTMENT_OTHER)
Admission: RE | Admit: 2014-12-01 | Discharge: 2014-12-01 | Disposition: A | Discharge status: Home / Self Care | Payer: Medicare Other | Source: Ambulatory Visit | Attending: General Surgery | Admitting: General Surgery

## 2014-12-01 ENCOUNTER — Encounter (HOSPITAL_BASED_OUTPATIENT_CLINIC_OR_DEPARTMENT_OTHER): Payer: Self-pay | Admitting: *Deleted

## 2014-12-01 ENCOUNTER — Encounter (HOSPITAL_BASED_OUTPATIENT_CLINIC_OR_DEPARTMENT_OTHER)
Admission: RE | Disposition: A | Discharge status: Home / Self Care | Payer: Self-pay | Source: Ambulatory Visit | Attending: General Surgery

## 2014-12-01 ENCOUNTER — Ambulatory Visit
Admission: RE | Admit: 2014-12-01 | Discharge: 2014-12-01 | Disposition: A | Discharge status: Home / Self Care | Payer: Medicare Other | Source: Ambulatory Visit | Attending: General Surgery | Admitting: General Surgery

## 2014-12-01 DIAGNOSIS — F419 Anxiety disorder, unspecified: Secondary | ICD-10-CM | POA: Insufficient documentation

## 2014-12-01 DIAGNOSIS — F329 Major depressive disorder, single episode, unspecified: Secondary | ICD-10-CM | POA: Insufficient documentation

## 2014-12-01 DIAGNOSIS — M1990 Unspecified osteoarthritis, unspecified site: Secondary | ICD-10-CM | POA: Insufficient documentation

## 2014-12-01 DIAGNOSIS — D241 Benign neoplasm of right breast: Secondary | ICD-10-CM

## 2014-12-01 DIAGNOSIS — Z803 Family history of malignant neoplasm of breast: Secondary | ICD-10-CM | POA: Insufficient documentation

## 2014-12-01 HISTORY — DX: Presence of spectacles and contact lenses: Z97.3

## 2014-12-01 HISTORY — DX: Age-related osteoporosis without current pathological fracture: M81.0

## 2014-12-01 HISTORY — PX: BREAST LUMPECTOMY WITH RADIOACTIVE SEED LOCALIZATION: SHX6424

## 2014-12-01 HISTORY — DX: Other seasonal allergic rhinitis: J30.2

## 2014-12-01 SURGERY — BREAST LUMPECTOMY WITH RADIOACTIVE SEED LOCALIZATION
Anesthesia: General | Laterality: Right

## 2014-12-01 MED ORDER — FENTANYL CITRATE 0.05 MG/ML IJ SOLN: 50.0000 ug | INTRAMUSCULAR | Status: DC | PRN

## 2014-12-01 MED ORDER — OXYCODONE HCL 5 MG/5ML PO SOLN: 5.0000 mg | Freq: Once | ORAL | Status: DC | PRN

## 2014-12-01 MED ORDER — MIDAZOLAM HCL 2 MG/2ML IJ SOLN: 1.0000 mg | INTRAMUSCULAR | Status: DC | PRN

## 2014-12-01 MED ORDER — ONDANSETRON HCL 4 MG/2ML IJ SOLN: 4.0000 mg | Freq: Once | INTRAMUSCULAR | Status: DC | PRN

## 2014-12-01 MED ORDER — EPHEDRINE SULFATE 50 MG/ML IJ SOLN
INTRAMUSCULAR | Status: DC | PRN
  Administered 2014-12-01: 5 mg via INTRAVENOUS

## 2014-12-01 MED ORDER — PROPOFOL 10 MG/ML IV EMUL
INTRAVENOUS | Status: AC
  Filled 2014-12-01: qty 50

## 2014-12-01 MED ORDER — SCOPOLAMINE 1 MG/3DAYS TD PT72
1.0000 | MEDICATED_PATCH | TRANSDERMAL | Status: DC
  Administered 2014-12-01: 1.5 mg via TRANSDERMAL

## 2014-12-01 MED ORDER — HYDROMORPHONE HCL 1 MG/ML IJ SOLN
0.2500 mg | INTRAMUSCULAR | Status: DC | PRN
  Administered 2014-12-01: 0.25 mg via INTRAVENOUS

## 2014-12-01 MED ORDER — CHLORHEXIDINE GLUCONATE 4 % EX LIQD: 1.0000 "application " | Freq: Once | CUTANEOUS | Status: DC

## 2014-12-01 MED ORDER — DEXAMETHASONE SODIUM PHOSPHATE 4 MG/ML IJ SOLN
INTRAMUSCULAR | Status: DC | PRN
  Administered 2014-12-01: 6 mg via INTRAVENOUS

## 2014-12-01 MED ORDER — BUPIVACAINE-EPINEPHRINE (PF) 0.5% -1:200000 IJ SOLN
INTRAMUSCULAR | Status: AC
  Filled 2014-12-01: qty 30

## 2014-12-01 MED ORDER — PROPOFOL 10 MG/ML IV BOLUS
INTRAVENOUS | Status: DC | PRN
  Administered 2014-12-01: 200 mg via INTRAVENOUS

## 2014-12-01 MED ORDER — HYDROMORPHONE HCL 1 MG/ML IJ SOLN
INTRAMUSCULAR | Status: AC
  Filled 2014-12-01: qty 1

## 2014-12-01 MED ORDER — CEFAZOLIN SODIUM-DEXTROSE 2-3 GM-% IV SOLR
INTRAVENOUS | Status: AC
  Filled 2014-12-01: qty 50

## 2014-12-01 MED ORDER — MIDAZOLAM HCL 5 MG/5ML IJ SOLN
INTRAMUSCULAR | Status: DC | PRN
  Administered 2014-12-01: 2 mg via INTRAVENOUS

## 2014-12-01 MED ORDER — OXYCODONE HCL 5 MG PO TABS: 5.0000 mg | ORAL_TABLET | Freq: Once | ORAL | Status: DC | PRN

## 2014-12-01 MED ORDER — LIDOCAINE HCL (CARDIAC) 20 MG/ML IV SOLN
INTRAVENOUS | Status: DC | PRN
  Administered 2014-12-01: 75 mg via INTRAVENOUS

## 2014-12-01 MED ORDER — FENTANYL CITRATE 0.05 MG/ML IJ SOLN
INTRAMUSCULAR | Status: DC | PRN
  Administered 2014-12-01: 100 ug via INTRAVENOUS

## 2014-12-01 MED ORDER — ONDANSETRON HCL 4 MG/2ML IJ SOLN
INTRAMUSCULAR | Status: DC | PRN
  Administered 2014-12-01: 4 mg via INTRAVENOUS

## 2014-12-01 MED ORDER — HYDROCODONE-ACETAMINOPHEN 5-325 MG PO TABS: 1.0000 | ORAL_TABLET | Freq: Four times a day (QID) | ORAL | Status: DC | PRN

## 2014-12-01 MED ORDER — BUPIVACAINE-EPINEPHRINE (PF) 0.5% -1:200000 IJ SOLN
INTRAMUSCULAR | Status: DC | PRN
  Administered 2014-12-01: 10 mL via PERINEURAL

## 2014-12-01 MED ORDER — MIDAZOLAM HCL 2 MG/2ML IJ SOLN
INTRAMUSCULAR | Status: AC
  Filled 2014-12-01: qty 2

## 2014-12-01 MED ORDER — FENTANYL CITRATE 0.05 MG/ML IJ SOLN
INTRAMUSCULAR | Status: AC
  Filled 2014-12-01: qty 6

## 2014-12-01 MED ORDER — CEFAZOLIN SODIUM-DEXTROSE 2-3 GM-% IV SOLR
2.0000 g | INTRAVENOUS | Status: AC
  Administered 2014-12-01: 2 g via INTRAVENOUS

## 2014-12-01 MED ORDER — SCOPOLAMINE 1 MG/3DAYS TD PT72
MEDICATED_PATCH | TRANSDERMAL | Status: AC
  Filled 2014-12-01: qty 1

## 2014-12-01 MED ORDER — LACTATED RINGERS IV SOLN
INTRAVENOUS | Status: DC
  Administered 2014-12-01 (×2): via INTRAVENOUS

## 2014-12-01 SURGICAL SUPPLY — 64 items
APL SKNCLS STERI-STRIP NONHPOA (GAUZE/BANDAGES/DRESSINGS)
APPLIER CLIP 9.375 MED OPEN (MISCELLANEOUS)
APR CLP MED 9.3 20 MLT OPN (MISCELLANEOUS)
BENZOIN TINCTURE PRP APPL 2/3 (GAUZE/BANDAGES/DRESSINGS) IMPLANT
BINDER BREAST LRG (GAUZE/BANDAGES/DRESSINGS) IMPLANT
BINDER BREAST MEDIUM (GAUZE/BANDAGES/DRESSINGS) ×2 IMPLANT
BINDER BREAST XLRG (GAUZE/BANDAGES/DRESSINGS) IMPLANT
BINDER BREAST XXLRG (GAUZE/BANDAGES/DRESSINGS) IMPLANT
BLADE HEX COATED 2.75 (ELECTRODE) ×3 IMPLANT
BLADE SURG 10 STRL SS (BLADE) IMPLANT
BLADE SURG 15 STRL LF DISP TIS (BLADE) ×1 IMPLANT
BLADE SURG 15 STRL SS (BLADE) ×3
CANISTER SUC SOCK COL 7IN (MISCELLANEOUS) ×3 IMPLANT
CANISTER SUCT 1200ML W/VALVE (MISCELLANEOUS) ×3 IMPLANT
CHLORAPREP W/TINT 26ML (MISCELLANEOUS) ×3 IMPLANT
CLIP APPLIE 9.375 MED OPEN (MISCELLANEOUS) IMPLANT
CLOSURE WOUND 1/2 X4 (GAUZE/BANDAGES/DRESSINGS)
COVER BACK TABLE 60X90IN (DRAPES) ×3 IMPLANT
COVER MAYO STAND STRL (DRAPES) ×3 IMPLANT
COVER PROBE W GEL 5X96 (DRAPES) ×3 IMPLANT
DECANTER SPIKE VIAL GLASS SM (MISCELLANEOUS) IMPLANT
DEVICE DUBIN W/COMP PLATE 8390 (MISCELLANEOUS) ×3 IMPLANT
DRAIN CHANNEL 19F RND (DRAIN) IMPLANT
DRAPE LAPAROSCOPIC ABDOMINAL (DRAPES) ×3 IMPLANT
DRAPE UTILITY XL STRL (DRAPES) ×3 IMPLANT
DRSG PAD ABDOMINAL 8X10 ST (GAUZE/BANDAGES/DRESSINGS) IMPLANT
ELECT REM PT RETURN 9FT ADLT (ELECTROSURGICAL) ×3
ELECTRODE REM PT RTRN 9FT ADLT (ELECTROSURGICAL) ×1 IMPLANT
EVACUATOR SILICONE 100CC (DRAIN) IMPLANT
GLOVE BIOGEL PI IND STRL 7.5 (GLOVE) IMPLANT
GLOVE BIOGEL PI INDICATOR 7.5 (GLOVE) ×2
GLOVE EUDERMIC 7 POWDERFREE (GLOVE) ×3 IMPLANT
GOWN STRL REUS W/ TWL LRG LVL3 (GOWN DISPOSABLE) ×1 IMPLANT
GOWN STRL REUS W/ TWL XL LVL3 (GOWN DISPOSABLE) ×1 IMPLANT
GOWN STRL REUS W/TWL LRG LVL3 (GOWN DISPOSABLE) ×3
GOWN STRL REUS W/TWL XL LVL3 (GOWN DISPOSABLE) ×3
KIT MARKER MARGIN INK (KITS) IMPLANT
LIQUID BAND (GAUZE/BANDAGES/DRESSINGS) ×3 IMPLANT
NDL HYPO 25X1 1.5 SAFETY (NEEDLE) IMPLANT
NEEDLE HYPO 25X1 1.5 SAFETY (NEEDLE) IMPLANT
NS IRRIG 1000ML POUR BTL (IV SOLUTION) ×3 IMPLANT
PACK BASIN DAY SURGERY FS (CUSTOM PROCEDURE TRAY) ×3 IMPLANT
PENCIL BUTTON HOLSTER BLD 10FT (ELECTRODE) ×3 IMPLANT
PIN SAFETY STERILE (MISCELLANEOUS) ×3 IMPLANT
SHEET MEDIUM DRAPE 40X70 STRL (DRAPES) IMPLANT
SLEEVE SCD COMPRESS KNEE MED (MISCELLANEOUS) ×3 IMPLANT
SPONGE GAUZE 4X4 12PLY STER LF (GAUZE/BANDAGES/DRESSINGS) IMPLANT
SPONGE LAP 18X18 X RAY DECT (DISPOSABLE) IMPLANT
SPONGE LAP 4X18 X RAY DECT (DISPOSABLE) ×3 IMPLANT
STRIP CLOSURE SKIN 1/2X4 (GAUZE/BANDAGES/DRESSINGS) IMPLANT
SUT ETHILON 3 0 FSL (SUTURE) IMPLANT
SUT MNCRL AB 4-0 PS2 18 (SUTURE) ×3 IMPLANT
SUT SILK 2 0 SH (SUTURE) ×3 IMPLANT
SUT VIC AB 2-0 CT1 27 (SUTURE)
SUT VIC AB 2-0 CT1 TAPERPNT 27 (SUTURE) IMPLANT
SUT VIC AB 3-0 SH 27 (SUTURE)
SUT VIC AB 3-0 SH 27X BRD (SUTURE) IMPLANT
SUT VICRYL 3-0 CR8 SH (SUTURE) ×3 IMPLANT
SYRINGE 10CC LL (SYRINGE) IMPLANT
TOWEL OR 17X24 6PK STRL BLUE (TOWEL DISPOSABLE) ×3 IMPLANT
TOWEL OR NON WOVEN STRL DISP B (DISPOSABLE) ×3 IMPLANT
TUBE CONNECTING 20'X1/4 (TUBING) ×1
TUBE CONNECTING 20X1/4 (TUBING) ×2 IMPLANT
YANKAUER SUCT BULB TIP NO VENT (SUCTIONS) ×3 IMPLANT

## 2014-12-01 NOTE — Anesthesia Preprocedure Evaluation (Signed)

## 2014-12-01 NOTE — Discharge Instructions (Signed)
°Post Anesthesia Home Care Instructions ° °Activity: °Get plenty of rest for the remainder of the day. A responsible adult should stay with you for 24 hours following the procedure.  °For the next 24 hours, DO NOT: °-Drive a car °-Operate machinery °-Drink alcoholic beverages °-Take any medication unless instructed by your physician °-Make any legal decisions or sign important papers. ° °Meals: °Start with liquid foods such as gelatin or soup. Progress to regular foods as tolerated. Avoid greasy, spicy, heavy foods. If nausea and/or vomiting occur, drink only clear liquids until the nausea and/or vomiting subsides. Call your physician if vomiting continues. ° °Special Instructions/Symptoms: °Your throat may feel dry or sore from the anesthesia or the breathing tube placed in your throat during surgery. If this causes discomfort, gargle with warm salt water. The discomfort should disappear within 24 hours. °[facility] °Office Phone Number [phone] ° °BREAST BIOPSY/LUMPECTOMY: POST OP INSTRUCTIONS ° °Always review your discharge instruction sheet given to you by the facility where your surgery was performed. ° °IF YOU HAVE DISABILITY OR FAMILY LEAVE FORMS, YOU MUST BRING THEM TO THE OFFICE FOR PROCESSING.  DO NOT GIVE THEM TO YOUR DOCTOR. ° °1. A prescription for pain medication may be given to you upon discharge.  Take your pain medication as prescribed, if needed.  If narcotic pain medicine is not needed, then you may take acetaminophen (Tylenol) or ibuprofen (Advil) as needed. °2. Take your usually prescribed medications unless otherwise directed °3. If you need a refill on your pain medication, please contact your pharmacy.  They will contact our office to request authorization.  Prescriptions will not be filled after 5pm or on week-ends. °4. You should eat very light the first 24 hours after surgery, such as soup, crackers, pudding, etc.  Resume your normal diet the day after  surgery. °5. Most patients will experience some swelling and bruising in the breast.  Ice packs and a good support bra will help.  Swelling and bruising can take several days to resolve.  °6. It is common to experience some constipation if taking pain medication after surgery.  Increasing fluid intake and taking a stool softener will usually help or prevent this problem from occurring.  A mild laxative (Milk of Magnesia or Miralax) should be taken according to package directions if there are no bowel movements after 48 hours. °7. Unless discharge instructions indicate otherwise, you may remove your bandages 24-48 hours after surgery, and you may shower at that time.  You may have steri-strips (small skin tapes) in place directly over the incision.  These strips should be left on the skin for 7-10 days.  If your surgeon used skin glue on the incision, you may shower in 24 hours.  The glue will flake off over the next 2-3 weeks.  Any sutures or staples will be removed at the office during your follow-up visit. °8. ACTIVITIES:  You may resume regular daily activities (gradually increasing) beginning the next day.  Wearing a good support bra or sports bra minimizes pain and swelling.  You may have sexual intercourse when it is comfortable. °a. You may drive when you no longer are taking prescription pain medication, you can comfortably wear a seatbelt, and you can safely maneuver your car and apply brakes. °b. RETURN TO WORK:  ______________________________________________________________________________________ °9. You should see your doctor in the office for a follow-up appointment approximately two weeks after your surgery.  Your doctor’s nurse will typically make your follow-up appointment when she calls you   with your pathology report.  Expect your pathology report 2-3 business days after your surgery.  You may call to check if you do not hear from us after three days. °10. OTHER INSTRUCTIONS:  _______________________________________________________________________________________________ _____________________________________________________________________________________________________________________________________ °_____________________________________________________________________________________________________________________________________ °_____________________________________________________________________________________________________________________________________ ° °WHEN TO CALL YOUR DOCTOR: °1. Fever over 101.0 °2. Nausea and/or vomiting. °3. Extreme swelling or bruising. °4. Continued bleeding from incision. °5. Increased pain, redness, or drainage from the incision. ° °The clinic staff is available to answer your questions during regular business hours.  Please don’t hesitate to call and ask to speak to one of the nurses for clinical concerns.  If you have a medical emergency, go to the nearest emergency room or call 911.  A surgeon from [facility] is always on call at the hospital. ° °For further questions, please visit [email or web address]  °

## 2014-12-01 NOTE — Transfer of Care (Signed)
Immediate Anesthesia Transfer of Care Note  Patient: Megan Gibbs  Procedure(s) Performed: Procedure(s): RIGHT BREAST LUMPECTOMY WITH RADIOACTIVE SEED LOCALIZATION (Right)  Patient Location: PACU  Anesthesia Type:General  Level of Consciousness: awake, alert  and patient cooperative  Airway & Oxygen Therapy: Patient Spontanous Breathing and Patient connected to face mask oxygen  Post-op Assessment: Report given to PACU RN, Post -op Vital signs reviewed and stable and Patient moving all extremities  Post vital signs: Reviewed and stable  Complications: No apparent anesthesia complications

## 2014-12-01 NOTE — Anesthesia Procedure Notes (Signed)
Procedure Name: LMA Insertion Date/Time: 12/01/2014 11:24 AM Performed by: Baxter Flattery Pre-anesthesia Checklist: Patient identified, Emergency Drugs available, Suction available and Patient being monitored Patient Re-evaluated:Patient Re-evaluated prior to inductionOxygen Delivery Method: Circle System Utilized Preoxygenation: Pre-oxygenation with 100% oxygen Intubation Type: IV induction Ventilation: Mask ventilation without difficulty LMA: LMA inserted LMA Size: 3.0 Number of attempts: 1 Airway Equipment and Method: bite block Placement Confirmation: positive ETCO2 and breath sounds checked- equal and bilateral Tube secured with: Tape Dental Injury: Teeth and Oropharynx as per pre-operative assessment

## 2014-12-01 NOTE — Anesthesia Postprocedure Evaluation (Signed)
  Anesthesia Post-op Note  Patient: Megan Gibbs  Procedure(s) Performed: Procedure(s): RIGHT BREAST LUMPECTOMY WITH RADIOACTIVE SEED LOCALIZATION (Right)  Patient Location: PACU  Anesthesia Type: General   Level of Consciousness: awake, alert  and oriented  Airway and Oxygen Therapy: Patient Spontanous Breathing  Post-op Pain: mild  Post-op Assessment: Post-op Vital signs reviewed  Post-op Vital Signs: Reviewed  Last Vitals:  Filed Vitals:   12/01/14 1245  BP: 131/60  Pulse: 66  Temp:   Resp: 12    Complications: No apparent anesthesia complications

## 2014-12-01 NOTE — Interval H&P Note (Signed)
History and Physical Interval Note:  12/01/2014 10:27 AM  Megan Gibbs  has presented today for surgery, with the diagnosis of right breast papaloma  The various methods of treatment have been discussed with the patient and family. After consideration of risks, benefits and other options for treatment, the patient has consented to  Procedure(s): RIGHT BREAST LUMPECTOMY WITH RADIOACTIVE SEED LOCALIZATION (Right) as a surgical intervention .  The patient's history has been reviewed, patient examined today, no change in status, stable for surgery.  I have reviewed the patient's chart and labs.  Questions were answered to the patient's satisfaction.     Adin Hector

## 2014-12-01 NOTE — Op Note (Signed)
Patient Name:           Megan Gibbs   Date of Surgery:        12/01/2014  Pre op Diagnosis:      Papilloma right breast  Post op Diagnosis:    Papilloma right breast  Procedure:                 Excision right breast papilloma with radioactive seed localization  Surgeon:                     Edsel Petrin. Dalbert Batman, M.D., FACS  Assistant:                      OR staff  Operative Indications:    This is a 63 year old African American female, referred to me by Dr. Pamelia Hoit at the breast center of [facility] for evaluation and surgical management of a right breast, subareolar intraductal papilloma. Her PCP is Dr. Tula Nakayama. The patient has no prior history of any breast problems. Recent mammograms show a 4 mm area of grouped calcifications in the right breast subareolar area. Thought to be suspicious. Image guided biopsy shows intraductal Papilloma with calcifications. She was referred. Family history reveals breast cancer in a sister and a first cousin. No ovarian cancer. Comorbidities include moderately severe anxiety depression on Remeron. Otherwise healthy.. She agrees to proceed with right breast lumpectomy with radioactive seed localization.   Operative Findings:       The subareolar tissue was excised through a subareolar incision placed laterally. The specimen contained the marker clip and radioactive seed radiographically, and I can hear the radioactivity in the lumpectomy specimen  Procedure in Detail:          The patient underwent radioactive seed placement yesterday to the right breast. In the holding area I listened to the right breast with the neoprobe and could hear the radioactivity in the right breast subareolar area. The patient was taken to the operating room and underwent general anesthesia with LMA device. The right breast was prepped and draped in a sterile fashion. Intravenous antibiotics were given. Surgical timeout was performed. 0.5% Marcaine with epinephrine was  used as a local infiltration anesthetic.    A curvilinear incision was made at the right areolar margin laterally. Using the neoprobe I dissected down into the right breast subareolar tissue and around the radioactivity. The specimen was removed and marked with silk sutures to orient the pathologist. The specimen mammogram looked good as described above. There was no residual radioactivity within the breast. The specimen was sent to the lab. Hemostasis was excellent and achieved with electrocautery. The wound was irrigated with saline. The breast tissue was closed with interrupted 3-0 Vicryl sutures and skin closed with running 4-0 Monocryl subcuticular suture and Dermabond. Ice pack was placed and patient taken to PACU in stable condition. EBL 10 mL. Counts correct. Competitions none.     Edsel Petrin. Dalbert Batman, M.D., FACS General and Minimally Invasive Surgery Breast and Colorectal Surgery  12/01/2014 12:01 PM

## 2014-12-02 NOTE — Progress Notes (Signed)
Quick Note:  Inform patient of Pathology report,. Tell her that there is a benign papilloma. I will discuss with her in detail at her next office visit. This is excellent news.  hmi  ______

## 2014-12-03 ENCOUNTER — Encounter (HOSPITAL_BASED_OUTPATIENT_CLINIC_OR_DEPARTMENT_OTHER): Payer: Self-pay | Admitting: General Surgery

## 2014-12-03 NOTE — Progress Notes (Signed)
Patient called. Documented in allscipts

## 2014-12-03 NOTE — Progress Notes (Signed)
Sharman Crate has completed

## 2014-12-07 ENCOUNTER — Encounter (HOSPITAL_BASED_OUTPATIENT_CLINIC_OR_DEPARTMENT_OTHER): Payer: Self-pay | Admitting: General Surgery

## 2015-03-14 DIAGNOSIS — E785 Hyperlipidemia, unspecified: Secondary | ICD-10-CM | POA: Diagnosis not present

## 2015-03-14 DIAGNOSIS — R7302 Impaired glucose tolerance (oral): Secondary | ICD-10-CM | POA: Diagnosis not present

## 2015-03-15 LAB — LIPID PANEL
CHOL/HDL RATIO: 2.6 ratio
CHOLESTEROL: 174 mg/dL (ref 0–200)
HDL: 68 mg/dL (ref >=46)
LDL Cholesterol: 89 mg/dL (ref 0–99)
TRIGLYCERIDES: 83 mg/dL (ref <150)
VLDL: 17 mg/dL (ref 0–40)

## 2015-03-15 LAB — HEMOGLOBIN A1C
HEMOGLOBIN A1C: 5.9 % — AB (ref <5.7)
MEAN PLASMA GLUCOSE: 123 mg/dL — AB (ref <117)

## 2015-03-17 ENCOUNTER — Ambulatory Visit (INDEPENDENT_AMBULATORY_CARE_PROVIDER_SITE_OTHER): Payer: Medicare Other | Admitting: Family Medicine

## 2015-03-17 ENCOUNTER — Encounter: Payer: Self-pay | Admitting: Family Medicine

## 2015-03-17 VITALS — BP 120/80 | HR 66 | Resp 16 | Ht 61.0 in | Wt 105.0 lb

## 2015-03-17 DIAGNOSIS — F418 Other specified anxiety disorders: Secondary | ICD-10-CM

## 2015-03-17 DIAGNOSIS — Z23 Encounter for immunization: Secondary | ICD-10-CM | POA: Diagnosis not present

## 2015-03-17 DIAGNOSIS — F341 Dysthymic disorder: Secondary | ICD-10-CM | POA: Diagnosis not present

## 2015-03-17 DIAGNOSIS — M810 Age-related osteoporosis without current pathological fracture: Secondary | ICD-10-CM

## 2015-03-17 DIAGNOSIS — R7302 Impaired glucose tolerance (oral): Secondary | ICD-10-CM | POA: Diagnosis not present

## 2015-03-17 DIAGNOSIS — J3089 Other allergic rhinitis: Secondary | ICD-10-CM

## 2015-03-17 DIAGNOSIS — F5105 Insomnia due to other mental disorder: Secondary | ICD-10-CM

## 2015-03-17 NOTE — Patient Instructions (Addendum)
Annual physical exam in 3 month, call if you need me before  Prevnar today  Labs have improved, so keep doing what you are doing, congrats  We will refer for diabetic ed   Make and keep follow up appt with Dr Dorris Fetch  Keep appts with psychiatry

## 2015-03-17 NOTE — Progress Notes (Signed)
   Subjective:    Patient ID: Megan Gibbs, female    DOB: 01/15/1951, 64 y.o.   MRN: [id number]  HPI The PT is here for follow up and re-evaluation of chronic medical conditions, medication management and review of any available recent lab and radiology data.  Preventive health is updated, specifically  Cancer screening and Immunization.   Questions or concerns regarding consultations or procedures which the PT has had in the interim are  Addressed.Had right breast biopsy in 11/2014 , which was benign. Has to re establish with psychiatry, and has upcoming appt, out of remron, denies uncontrolled depression, is having mild insomnia She has worked on diet and exercise as she is prediabetic, good results , also interested in nutrtional counseling The PT denies any adverse reactions to current medications since the last visit.  There are no new concerns.  There are no specific complaints       Review of Systems See HPI Denies recent fever or chills. Denies sinus pressure, nasal congestion, ear pain or sore throat. Denies chest congestion, productive cough or wheezing. Denies chest pains, palpitations and leg swelling Denies abdominal pain, nausea, vomiting,diarrhea or constipation.   Denies dysuria, frequency, hesitancy or incontinence. Denies joint pain, swelling and limitation in mobility. Denies headaches, seizures, numbness, or tingling. Denies skin break down or rash.         Objective:   Physical Exam BP 120/80 mmHg  Pulse 66  Resp 16  Ht 5\' 1"  (1.549 m)  Wt 105 lb (47.628 kg)  BMI 19.85 kg/m2  SpO2 100% Patient alert and oriented and in no cardiopulmonary distress.  HEENT: No facial asymmetry, EOMI,   oropharynx pink and moist.  Neck supple no JVD, no mass.  Chest: Clear to auscultation bilaterally.  CVS: S1, S2 no murmurs, no S3.Regular rate.  ABD: Soft non tender.   Ext: No edema  MS: Adequate ROM spine, shoulders, hips and knees.  Skin: Intact, no  ulcerations or rash noted.  Psych: Good eye contact, normal affect. Memory intact not anxious or depressed appearing.  CNS: CN 2-12 intact, power,  normal throughout.no focal deficits noted.        Assessment & Plan:  Allergic rhinitis Controlled, no change in medication    IGT (impaired glucose tolerance) Improved, pt applauded on this Patient educated about the importance of limiting  Carbohydrate intake , the need to commit to daily physical activity for a minimum of 30 minutes , and to commit weight loss. The fact that changes in all these areas will reduce or eliminate all together the development of diabetes is stressed.   Referred to dietitian   Osteoporosis Will re estalish with endo, missed December appt as she was having breast biopsy , she has not responded to oral medication, and is referred for help with this   Insomnia secondary to depression with anxiety Uncontrolled, neds to re establish with psyc, not actively suicidal, homicidal or hallucinating, does have mild insomnia, and anxiety   Need for vaccination with 13-polyvalent pneumococcal conjugate vaccine After obtaining informed consent, the vaccine is  administered by LPN

## 2015-03-18 DIAGNOSIS — Z23 Encounter for immunization: Secondary | ICD-10-CM | POA: Insufficient documentation

## 2015-03-18 NOTE — Assessment & Plan Note (Signed)
Uncontrolled, neds to re establish with psyc, not actively suicidal, homicidal or hallucinating, does have mild insomnia, and anxiety

## 2015-03-18 NOTE — Assessment & Plan Note (Signed)
After obtaining informed consent, the vaccine is  administered by LPN.  

## 2015-03-18 NOTE — Assessment & Plan Note (Signed)
Improved, pt applauded on this Patient educated about the importance of limiting  Carbohydrate intake , the need to commit to daily physical activity for a minimum of 30 minutes , and to commit weight loss. The fact that changes in all these areas will reduce or eliminate all together the development of diabetes is stressed.   Referred to dietitian

## 2015-03-18 NOTE — Assessment & Plan Note (Signed)
Controlled, no change in medication  

## 2015-03-18 NOTE — Assessment & Plan Note (Signed)
Will re estalish with endo, missed December appt as she was having breast biopsy , she has not responded to oral medication, and is referred for help with this

## 2015-04-01 ENCOUNTER — Ambulatory Visit (INDEPENDENT_AMBULATORY_CARE_PROVIDER_SITE_OTHER): Payer: 59 | Admitting: Psychiatry

## 2015-04-01 ENCOUNTER — Encounter (HOSPITAL_COMMUNITY): Payer: Self-pay | Admitting: Psychiatry

## 2015-04-01 VITALS — BP 126/74 | HR 64 | Ht 61.0 in | Wt 103.8 lb

## 2015-04-01 DIAGNOSIS — F418 Other specified anxiety disorders: Secondary | ICD-10-CM

## 2015-04-01 DIAGNOSIS — F5105 Insomnia due to other mental disorder: Principal | ICD-10-CM

## 2015-04-01 DIAGNOSIS — F329 Major depressive disorder, single episode, unspecified: Secondary | ICD-10-CM | POA: Diagnosis not present

## 2015-04-01 DIAGNOSIS — M818 Other osteoporosis without current pathological fracture: Secondary | ICD-10-CM | POA: Diagnosis not present

## 2015-04-01 MED ORDER — MIRTAZAPINE 15 MG PO TABS: 15.0000 mg | ORAL_TABLET | Freq: Every day | ORAL | Status: DC
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
## 2015-05-13 ENCOUNTER — Encounter: Payer: Self-pay | Admitting: Nutrition

## 2015-05-13 ENCOUNTER — Encounter: Payer: Medicare Other | Attending: "Endocrinology | Admitting: Nutrition

## 2015-05-13 VITALS — Ht 61.0 in | Wt 100.6 lb

## 2015-05-13 DIAGNOSIS — R739 Hyperglycemia, unspecified: Secondary | ICD-10-CM

## 2015-05-13 NOTE — Patient Instructions (Signed)
  Goal: 1. Follow Plate Method 2. Eat 2 carb choices per meal as discussed 3. Increase fresh vegetables  4. Cut out fried and saturated fat choices. 5. Continue exercise. 6. Eat meals on schedule: B)6-8 am L) 12-2 pm D) 5-7 pm. Avoid snacks between meals or  after supper unless it's just protein choices like nuts, or cheese. 7. Slowly gain weight 1 lb per month. 8. Keep A1C 5.9% or less.

## 2015-05-13 NOTE — Progress Notes (Signed)
  Medical Nutrition Therapy:  Appt start time: 0830 end time:  0930.   Assessment:  Primary concerns today:Prediabets. Lives by herself. She does her own shopping and cooking. Most foods are boiled and grilled. Most A1C. 5.9%.  Physically activity: Gets on treadmilll 30 minutes and does 45 squats daily. Has lost 13 lbs over the last 6-8 months when she cut out fried foods and sweets due to elevated cholesterol and blood sugars with Prediabetes. Has a strong family of DM. Grandmother had a stroke. Family history of thin people. She notes she has been small most of her life.  Preferred Learning Style:   Auditory  Visual  Hands on   Learning Readiness:   Ready  Change in progress   MEDICATIONS: See list   DIETARY INTAKE:  24-hr recall:  B ( AM):  Oatmeal, raisin bran or frosted flakes, OR egg whites with cheese on english muffin with 50/50 juice Snk ( AM): none   L ( PM): Tuna sandwich or PB?Jelly on ww bread, water or fuzzy with honey/ginseng Snk ( PM): apple or grapes, water OR kettle chips or salty/sweet popcorn, D ( PM): Cheeseburger, small fries,lemonade Snk ( PM): none OR fruit Beverages: water  Usual physical activity: walks on treadmill  Estimated energy needs: 1500 calories 170 g carbohydrates 112 g protein 42 g fat  Progress Towards Goal(s):  In progress.   Nutritional Diagnosis:  NB-1.1 Food and nutrition-related knowledge deficit As related to Prediabetes .  As evidenced by A1C 5.9%..    Intervention:  Nutrition and diabetes education on prediabetes, CHO counting, meal planing, MY Plate, and benefits of exercise. Reviewed Low fat High Fiber Low Sodium Diet.  Goal: 1. Follow Plate Method 2. Eat 2 carb choices per meal as discussed 3. Increase fresh vegetables  4. Cut out fried and saturated fat choices. 5. Continue exercise. 6. Eat meals on schedule: B)6-8 am L) 12-2 pm D) 5-7 pm. Avoid snacks between meals or  after supper unless it's just protein  choices like nuts, or cheese. 7. Slowly gain weight 1 lb per month. 8. Keep A1C 5.9% or less.  Teaching Method Utilized:  Visual Auditory Hands on  Handouts given during visit include:  The Plate Method  The Meal Plan Card  Barriers to learning/adherence to lifestyle change: none  Demonstrated degree of understanding via:  Teach Back   Monitoring/Evaluation:  Dietary intake, exercise, meal planning, and body weight in 1 month(s).

## 2015-06-21 ENCOUNTER — Encounter: Payer: Self-pay | Admitting: Family Medicine

## 2015-06-21 ENCOUNTER — Ambulatory Visit (INDEPENDENT_AMBULATORY_CARE_PROVIDER_SITE_OTHER): Payer: Medicare Other | Admitting: Family Medicine

## 2015-06-21 ENCOUNTER — Other Ambulatory Visit (HOSPITAL_COMMUNITY)
Admission: RE | Admit: 2015-06-21 | Discharge: 2015-06-21 | Disposition: A | Discharge status: Home / Self Care | Payer: Medicare Other | Source: Ambulatory Visit | Attending: Family Medicine | Admitting: Family Medicine

## 2015-06-21 VITALS — BP 128/74 | HR 63 | Resp 16 | Ht 61.0 in | Wt 100.0 lb

## 2015-06-21 DIAGNOSIS — Z01419 Encounter for gynecological examination (general) (routine) without abnormal findings: Secondary | ICD-10-CM | POA: Diagnosis not present

## 2015-06-21 DIAGNOSIS — R87619 Unspecified abnormal cytological findings in specimens from cervix uteri: Secondary | ICD-10-CM | POA: Insufficient documentation

## 2015-06-21 DIAGNOSIS — R7302 Impaired glucose tolerance (oral): Secondary | ICD-10-CM | POA: Diagnosis not present

## 2015-06-21 DIAGNOSIS — Z124 Encounter for screening for malignant neoplasm of cervix: Secondary | ICD-10-CM

## 2015-06-21 DIAGNOSIS — Z1211 Encounter for screening for malignant neoplasm of colon: Secondary | ICD-10-CM | POA: Diagnosis not present

## 2015-06-21 DIAGNOSIS — Z0000 Encounter for general adult medical examination without abnormal findings: Secondary | ICD-10-CM | POA: Insufficient documentation

## 2015-06-21 DIAGNOSIS — R636 Underweight: Secondary | ICD-10-CM

## 2015-06-21 DIAGNOSIS — R630 Anorexia: Secondary | ICD-10-CM | POA: Insufficient documentation

## 2015-06-21 DIAGNOSIS — F411 Generalized anxiety disorder: Secondary | ICD-10-CM

## 2015-06-21 DIAGNOSIS — Z113 Encounter for screening for infections with a predominantly sexual mode of transmission: Secondary | ICD-10-CM | POA: Diagnosis not present

## 2015-06-21 LAB — POC HEMOCCULT BLD/STL (OFFICE/1-CARD/DIAGNOSTIC): FECAL OCCULT BLD: NEGATIVE

## 2015-06-21 MED ORDER — VITAMIN D (ERGOCALCIFEROL) 1.25 MG (50000 UNIT) PO CAPS: 50000.0000 [IU] | ORAL_CAPSULE | ORAL | Status: DC

## 2015-06-21 MED ORDER — MEGESTROL ACETATE 40 MG PO TABS: 40.0000 mg | ORAL_TABLET | Freq: Every day | ORAL | Status: DC

## 2015-06-21 NOTE — Patient Instructions (Addendum)
F/u in 8 weeks . Call if you need me before New medication for appetite, megace one daily  Youa re referred to counselor who you have seen in the past  Resume weekly vitamin D  HIV blood test and HBA1C today

## 2015-06-21 NOTE — Assessment & Plan Note (Signed)

## 2015-06-22 LAB — HEMOGLOBIN A1C
Hgb A1c MFr Bld: 5.6 % (ref <5.7)
Mean Plasma Glucose: 114 mg/dL (ref <117)

## 2015-06-22 LAB — HIV ANTIBODY (ROUTINE TESTING W REFLEX): HIV 1&2 Ab, 4th Generation: NONREACTIVE

## 2015-06-24 LAB — CYTOLOGY - PAP

## 2015-07-01 DIAGNOSIS — R636 Underweight: Secondary | ICD-10-CM | POA: Insufficient documentation

## 2015-07-01 NOTE — Progress Notes (Signed)
Patient is in for annual physical exam. She expresses concern about poor appetite and failure to gain weight, requests appetite stimulant short term. Continue to wrestle with a lot of family issues.   BP 128/74 mmHg  Pulse 63  Resp 16  Ht 5\' 1"  (1.549 m)  Wt 100 lb (45.36 kg)  BMI 18.90 kg/m2  SpO2 100%  Pleasant under nourished female, alert and oriented x 3, in no cardio-pulmonary distress. Afebrile. HEENT No facial trauma or asymetry. Sinuses non tender.  Extra occullar muscles intact, pupils equally reactive to light. External ears normal, tympanic membranes clear. Oropharynx moist, no exudate, fairly good dentition. Neck: supple, no adenopathy,JVD or thyromegaly.No bruits.  Chest: Clear to ascultation bilaterally.No crackles or wheezes. Non tender to palpation  Breast: No asymetry,no masses or lumps. No tenderness. No nipple discharge or inversion. No axillary or supraclavicular adenopathy  Cardiovascular system; Heart sounds normal,  S1 and  S2 ,no S3.  No murmur, or thrill. Apical beat not displaced Peripheral pulses normal.  Abdomen: Soft, non tender, no organomegaly or masses. No bruits. Bowel sounds normal. No guarding, tenderness or rebound.  Rectal:  Normal sphincter tone. No mass.No rectal masses.  Guaiac negative stool.  GU: External genitalia normal female genitalia , female distribution of hair. No lesions. Urethral meatus normal in size, no  Prolapse, no lesions visibly  Present. Bladder non tender. Vagina pink and moist , with no visible lesions , discharge present . Adequate pelvic support no  cystocele or rectocele noted Cervix pink and appears healthy, no lesions or ulcerations noted, no discharge noted from os Uterus normal size, no adnexal masses, no cervical motion or adnexal tenderness.   Musculoskeletal exam: Full ROM of spine, hips , shoulders and knees. No deformity ,swelling or crepitus noted. No muscle wasting or atrophy.    Neurologic: Cranial nerves 2 to 12 intact. Power, tone ,sensation and reflexes normal throughout. No disturbance in gait. No tremor.  Skin: Intact, no ulceration, erythema , scaling or rash noted. Pigmentation normal throughout  Psych; Anxioius  mood and affect. Judgement and concentration normal  A/P   Annual physical exam Annual exam as documented. Counseling done  re healthy lifestyle involving commitment to 150 minutes exercise per week, heart healthy diet, and attaining healthy weight.The importance of adequate sleep also discussed. Regular seat belt use and home safety, is also discussed. Changes in health habits are decided on by the patient with goals and time frames  set for achieving them. Immunization and cancer screening needs are specifically addressed at this visit.   Underweight Start megace 40 mg daily, f/u in 8 to 10 weeks Pt encouraged to eat small portions often Also needs toi have anxiety issues dealt with, will make sooner f/u appt with mental health

## 2015-07-01 NOTE — Assessment & Plan Note (Signed)
Start megace 40 mg daily, f/u in 8 to 10 weeks Pt encouraged to eat small portions often Also needs toi have anxiety issues dealt with, will make sooner f/u appt with mental health

## 2015-07-18 ENCOUNTER — Telehealth: Payer: Self-pay | Admitting: Nutrition

## 2015-07-18 ENCOUNTER — Ambulatory Visit: Payer: Self-pay | Admitting: Nutrition

## 2015-07-18 NOTE — Telephone Encounter (Signed)
TC pt to schedule missed appointment. She notes she has been to see Dr. Moshe Cipro and is losing weight. Down to 100 lbs and Dr.Simpson wants her to gain weight back. A1C 5.6%. Had lump removed on her right breast and she was under a lot of stress and admits to probably not eating. Is working on drinking Boost or CIB with meals for needed weight gain. Doesn't feel she needs follow up at this time. Encouraged her to f/u with me if needed in the future. Jearld Fenton, RDN CDE

## 2015-07-21 ENCOUNTER — Ambulatory Visit (HOSPITAL_COMMUNITY): Payer: Self-pay | Admitting: Psychiatry

## 2015-07-29 ENCOUNTER — Ambulatory Visit (HOSPITAL_COMMUNITY): Payer: Self-pay | Admitting: Psychiatry

## 2015-08-01 ENCOUNTER — Ambulatory Visit (HOSPITAL_COMMUNITY): Payer: Self-pay | Admitting: Psychiatry

## 2015-08-08 ENCOUNTER — Telehealth (HOSPITAL_COMMUNITY): Payer: Self-pay | Admitting: *Deleted

## 2015-08-08 ENCOUNTER — Other Ambulatory Visit (HOSPITAL_COMMUNITY): Payer: Self-pay | Admitting: Psychiatry

## 2015-08-08 ENCOUNTER — Encounter (HOSPITAL_COMMUNITY): Payer: Self-pay | Admitting: Psychiatry

## 2015-08-08 ENCOUNTER — Ambulatory Visit (INDEPENDENT_AMBULATORY_CARE_PROVIDER_SITE_OTHER): Payer: 59 | Admitting: Psychiatry

## 2015-08-08 VITALS — BP 110/70 | Ht 61.0 in | Wt 103.6 lb

## 2015-08-08 DIAGNOSIS — F5105 Insomnia due to other mental disorder: Principal | ICD-10-CM

## 2015-08-08 DIAGNOSIS — F329 Major depressive disorder, single episode, unspecified: Secondary | ICD-10-CM | POA: Diagnosis not present

## 2015-08-08 DIAGNOSIS — F418 Other specified anxiety disorders: Secondary | ICD-10-CM

## 2015-08-08 DIAGNOSIS — F321 Major depressive disorder, single episode, moderate: Secondary | ICD-10-CM

## 2015-08-08 MED ORDER — CLONAZEPAM 0.5 MG PO TABS: 0.5000 mg | ORAL_TABLET | Freq: Two times a day (BID) | ORAL | Status: DC | PRN

## 2015-08-08 MED ORDER — MIRTAZAPINE 15 MG PO TABS
15.0000 mg | ORAL_TABLET | Freq: Every day | ORAL | Status: DC
Start: 2015-08-08 — End: 2015-12-19
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
## 2015-08-08 NOTE — Telephone Encounter (Signed)
Pt came into office with a letter stating pt need a 90 days supply of her Alendronate Sodium. Per pt pharmacy, pt will get it cheaper if she switch to 90 days. Pt number is [phone].

## 2015-08-08 NOTE — Telephone Encounter (Signed)
Pt came into office pt pick up her printed script for Klonopin. Pt agreed to script. Pt D/L number is [id number] expiration number 08-23-2019.

## 2015-08-08 NOTE — Telephone Encounter (Signed)
Noted  

## 2015-08-08 NOTE — Telephone Encounter (Signed)
Please route to Octavia to call her to pick up script for clonazepam

## 2015-08-08 NOTE — Telephone Encounter (Signed)
Pt is aware and shows understanding and stated that she come by office today to pick script up

## 2015-08-08 NOTE — Telephone Encounter (Signed)
patient came back in, wants to know if Dr. Harrington Challenger can give her a nerve pill.    She gets so nervous.

## 2015-08-08 NOTE — Telephone Encounter (Signed)
I don't prescribe that drug

## 2015-08-16 ENCOUNTER — Ambulatory Visit (INDEPENDENT_AMBULATORY_CARE_PROVIDER_SITE_OTHER): Payer: Medicare Other | Admitting: Family Medicine

## 2015-08-16 ENCOUNTER — Encounter: Payer: Self-pay | Admitting: Family Medicine

## 2015-08-16 VITALS — BP 108/68 | HR 80 | Resp 16 | Ht 61.0 in | Wt 104.1 lb

## 2015-08-16 DIAGNOSIS — R7302 Impaired glucose tolerance (oral): Secondary | ICD-10-CM

## 2015-08-16 DIAGNOSIS — Z1159 Encounter for screening for other viral diseases: Secondary | ICD-10-CM | POA: Diagnosis not present

## 2015-08-16 DIAGNOSIS — R636 Underweight: Secondary | ICD-10-CM

## 2015-08-16 DIAGNOSIS — R630 Anorexia: Secondary | ICD-10-CM

## 2015-08-16 DIAGNOSIS — Z13220 Encounter for screening for lipoid disorders: Secondary | ICD-10-CM | POA: Diagnosis not present

## 2015-08-16 DIAGNOSIS — Z1231 Encounter for screening mammogram for malignant neoplasm of breast: Secondary | ICD-10-CM

## 2015-08-16 MED ORDER — MEGESTROL ACETATE 40 MG PO TABS: 40.0000 mg | ORAL_TABLET | Freq: Every day | ORAL | Status: DC

## 2015-08-16 MED ORDER — VITAMIN D (ERGOCALCIFEROL) 1.25 MG (50000 UNIT) PO CAPS: 50000.0000 [IU] | ORAL_CAPSULE | ORAL | Status: DC

## 2015-08-16 NOTE — Patient Instructions (Addendum)
F/u in 4 month, call if you need me before  Thankful you are doing BETTER with the medication you have for your appetite.  No change in medication  Take medication for nerves as Dr Harrington Challenger prescribes  Fasting labs for December  Mammogram will be scheduled for [facility] due

## 2015-08-29 ENCOUNTER — Encounter (HOSPITAL_COMMUNITY): Payer: Self-pay | Admitting: Psychiatry

## 2015-08-29 ENCOUNTER — Ambulatory Visit (INDEPENDENT_AMBULATORY_CARE_PROVIDER_SITE_OTHER): Payer: 59 | Admitting: Psychiatry

## 2015-08-29 DIAGNOSIS — F330 Major depressive disorder, recurrent, mild: Secondary | ICD-10-CM

## 2015-08-29 NOTE — Patient Instructions (Signed)
Discussed orally 
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
## 2015-09-11 NOTE — Progress Notes (Signed)
   Subjective:    Patient ID: Megan Gibbs, female    DOB: [birth year], 64 y.o.   MRN: [id number]  HPI The PT is here for follow up and re-evaluation of chronic medical conditions, medication management and review of any available recent lab and radiology data.  Preventive health is updated, specifically  Cancer screening and Immunization.   Questions or concerns regarding consultations or procedures which the PT has had in the interim are  addressed. The PT denies any adverse reactions to current medications since the last visit.  There are no new concerns.  There are no specific complaints       Review of Systems See HPI Denies recent fever or chills. Denies sinus pressure, nasal congestion, ear pain or sore throat. Denies chest congestion, productive cough or wheezing. Denies chest pains, palpitations and leg swelling Denies abdominal pain, nausea, vomiting,diarrhea or constipation.   Denies dysuria, frequency, hesitancy or incontinence. Denies joint pain, swelling and limitation in mobility. Denies headaches, seizures, numbness, or tingling. Denies uncontrolled  depression, anxiety or insomnia. Denies skin break down or rash.         Objective:   Physical Exam  BP 108/68 mmHg  Pulse 80  Resp 16  Ht 5\' 1"  (1.549 m)  Wt 104 lb 1.9 oz (47.229 kg)  BMI 19.68 kg/m2  SpO2 100% Patient alert and oriented and in no cardiopulmonary distress.  HEENT: No facial asymmetry, EOMI,   oropharynx pink and moist.  Neck supple no JVD, no mass.  Chest: Clear to auscultation bilaterally.  CVS: S1, S2 no murmurs, no S3.Regular rate.  ABD: Soft non tender.   Ext: No edema  MS: Adequate ROM spine, shoulders, hips and knees.  Skin: Intact, no ulcerations or rash noted.  Psych: Good eye contact, normal affect. Memory intact not anxious or depressed appearing.  CNS: CN 2-12 intact, power,  normal throughout.no focal deficits noted.       Assessment & Plan:  Poor  appetite Improved with megace , 4 pound weight gain in 2 months on lowest dose, continue same  Underweight Still a challenge, but improving with better control of mental health as well as appetite stimulant  DEPRESSION, SEVERE Markedly improved and treated by psychiatry

## 2015-09-11 NOTE — Assessment & Plan Note (Addendum)
Improved with megace , 4 pound weight gain in 2 months on lowest dose, continue same

## 2015-09-11 NOTE — Assessment & Plan Note (Signed)
Markedly improved and treated by psychiatry

## 2015-09-11 NOTE — Assessment & Plan Note (Signed)
Still a challenge, but improving with better control of mental health as well as appetite stimulant

## 2015-09-20 ENCOUNTER — Ambulatory Visit (INDEPENDENT_AMBULATORY_CARE_PROVIDER_SITE_OTHER): Payer: Medicare Other

## 2015-09-20 DIAGNOSIS — Z23 Encounter for immunization: Secondary | ICD-10-CM

## 2015-09-21 DIAGNOSIS — H25049 Posterior subcapsular polar age-related cataract, unspecified eye: Secondary | ICD-10-CM | POA: Diagnosis not present

## 2015-09-21 DIAGNOSIS — H524 Presbyopia: Secondary | ICD-10-CM | POA: Diagnosis not present

## 2015-09-21 DIAGNOSIS — H5203 Hypermetropia, bilateral: Secondary | ICD-10-CM | POA: Diagnosis not present

## 2015-09-21 DIAGNOSIS — H52223 Regular astigmatism, bilateral: Secondary | ICD-10-CM | POA: Diagnosis not present

## 2015-09-22 ENCOUNTER — Encounter (HOSPITAL_COMMUNITY): Payer: Self-pay | Admitting: Psychiatry

## 2015-09-22 ENCOUNTER — Ambulatory Visit (INDEPENDENT_AMBULATORY_CARE_PROVIDER_SITE_OTHER): Payer: 59 | Admitting: Psychiatry

## 2015-09-22 DIAGNOSIS — F330 Major depressive disorder, recurrent, mild: Secondary | ICD-10-CM | POA: Diagnosis not present

## 2015-09-22 NOTE — Progress Notes (Signed)
   THERAPIST PROGRESS NOTE  Session Time: Thursday 09/22/2015 10:15 AM - 11:10 AM   Participation Level: Active   Behavioral Response: CasualAlertAnxious  Type of Therapy: Individual Therapy  Treatment Goals addressed:  1. Learn and implement calming and coping strategies to reduce overall anxiety.      2. Identify, challenge, and replace negative fearful self talk with self talk that facilitates less anxiety and worry  Interventions: Supportive  Summary: Megan Gibbs is a 64 y.o. female who is a returning patient to this clinician. She last was seen in November 2014. She is resuming services today because she has faced several stressors in the past few months. Her ex-husband died 2015-04-28.  Patient had a mass from her breast in December 2015 but it was not cancerous. Patient also moved in 2015-04-28 to a house at the end of a dead end street and reports feeling isolated. She reports having significant weight loss.as she experienced decreased appetite. She reports her youngest daughter stopped having contact with her in June 2016 as she and daughter had conflict regarding patient's furniture. Patient is very upset about this and is stressed even more by the fact that daughter didn't contact her on her birthday last week. She reports additional stress related to legal/financial issues involving the family home.   Patient states doing a little better since last session. She reports trying to increase involvement in activity by going places with her family and says this has calls her to feel better. She also is pleased her youngest daughter has resumed having contact. Patient continues to experience anxiety and reports recently having a panic attack while driving. She continues to worry about various issues including family problems and her housing situation.  Suicidal/Homicidal: No  Therapist Response: Therapist works with patient to identify and verbalize feelings, identify strengths and  support, develop treatment plan, discuss chemistry of anxiety and the effects on the body, discuss rationale for focused breathing, practice focused breathing  Plan: Return again in 2-3 weeks. Patient agrees to practice focused breathing 5-10 minutes 2 times per day, complete focused breathing log, and bring to next session.  Diagnosis: Axis I: Major depressive disorder, recurrent, mild, anxiety disorder    Axis II: Deferred    BYNUM,PEGGY, LCSW 09/22/2015

## 2015-09-22 NOTE — Patient Instructions (Signed)
Discussed orally 

## 2015-09-26 DIAGNOSIS — M818 Other osteoporosis without current pathological fracture: Secondary | ICD-10-CM | POA: Diagnosis not present

## 2015-10-07 ENCOUNTER — Ambulatory Visit (HOSPITAL_COMMUNITY): Payer: Self-pay | Admitting: Psychiatry

## 2015-10-13 ENCOUNTER — Encounter: Payer: Self-pay | Admitting: "Endocrinology

## 2015-10-13 ENCOUNTER — Ambulatory Visit (INDEPENDENT_AMBULATORY_CARE_PROVIDER_SITE_OTHER): Payer: Medicare Other | Admitting: "Endocrinology

## 2015-10-13 ENCOUNTER — Telehealth: Payer: Self-pay

## 2015-10-13 VITALS — BP 122/75 | HR 70 | Ht 61.0 in | Wt 110.0 lb

## 2015-10-13 DIAGNOSIS — E559 Vitamin D deficiency, unspecified: Secondary | ICD-10-CM

## 2015-10-13 DIAGNOSIS — N644 Mastodynia: Secondary | ICD-10-CM

## 2015-10-13 DIAGNOSIS — M818 Other osteoporosis without current pathological fracture: Secondary | ICD-10-CM | POA: Diagnosis not present

## 2015-10-13 MED ORDER — VITAMIN D (ERGOCALCIFEROL) 1.25 MG (50000 UNIT) PO CAPS: 50000.0000 [IU] | ORAL_CAPSULE | ORAL | Status: DC

## 2015-10-13 MED ORDER — ALENDRONATE SODIUM 70 MG PO TABS: 70.0000 mg | ORAL_TABLET | ORAL | Status: DC

## 2015-10-13 NOTE — Telephone Encounter (Signed)
States she has been having right breast pain. Had a right breast biopsy done a year ago and doesn't know if she's still healing. Called surgeon  Fanny Skates and he said for her to follow up with her pcp. I advised that she go ahead and have her mammogram done but she wants to make sure the dr agrees with that or if you want her to come in for evaluation first.

## 2015-10-13 NOTE — Progress Notes (Signed)
Subjective:    Patient ID: Megan Gibbs, female    DOB: 02-16-1951,    Past Medical History  Diagnosis Date  . Hypokalemia   . Allergic rhinitis   . Back pain   . Vertigo   . Depression   . Anxiety   . Anemia   . Osteoporosis   . Seasonal allergies   . Wears glasses   . Prediabetes    Past Surgical History  Procedure Laterality Date  . Back surgery  1992 & 2009    Dr. Joya Salm   . Tubal ligation      1976  . Colonoscopy N/A 04/19/2014    Procedure: COLONOSCOPY;  Surgeon: Daneil Dolin, MD;  [facility];  Service: Endoscopy;  Laterality: N/A;  9:30 AM  . Dilation and curettage of uterus    . Breast lumpectomy with radioactive seed localization Right 12/01/2014    Procedure: RIGHT BREAST LUMPECTOMY WITH RADIOACTIVE SEED LOCALIZATION;  Surgeon: Fanny Skates, MD;  Location: [place];  Service: General;  Laterality: Right;   Social History   Social History  . Marital Status: Married    Spouse Name: N/A  . Number of Children: 3  . Years of Education: N/A   Occupational History  . disabled     Social History Main Topics  . Smoking status: Never Smoker   . Smokeless tobacco: Never Used  . Alcohol Use: No  . Drug Use: No  . Sexual Activity: No   Other Topics Concern  . None   Social History Narrative   Outpatient Encounter Prescriptions as of 10/13/2015  Medication Sig  . alendronate (FOSAMAX) 70 MG tablet Take 1 tablet (70 mg total) by mouth once a week. Take with a full glass of water on an empty stomach.  [name] aspirin (ASPIRIN LOW DOSE) 81 MG EC tablet Take 81 mg by mouth daily.    . B Complex-Minerals (GERAVIM) LIQD Take by mouth. Taking 3 Tablespoon Three time a day  . calcium-vitamin D (OSCAL WITH D) 500-200 MG-UNIT per tablet Take 1 tablet by mouth daily with breakfast.  . clonazePAM (KLONOPIN) 0.5 MG tablet Take 1 tablet (0.5 mg total) by mouth 2 (two) times daily as needed for anxiety.  . ferrous sulfate 325 (65 FE) MG tablet  Take 325 mg by mouth daily with breakfast.  . megestrol (MEGACE) 40 MG tablet Take 1 tablet (40 mg total) by mouth daily.  . mirtazapine (REMERON) 15 MG tablet Take 1 tablet (15 mg total) by mouth at bedtime.  . Vitamin D, Ergocalciferol, (DRISDOL) 50000 UNITS CAPS capsule Take 1 capsule (50,000 Units total) by mouth every 28 (twenty-eight) days.  . [DISCONTINUED] alendronate (FOSAMAX) 70 MG tablet Take 70 mg by mouth once a week. Take with a full glass of water on an empty stomach.  . [DISCONTINUED] Vitamin D, Ergocalciferol, (DRISDOL) 50000 UNITS CAPS capsule Take 1 capsule (50,000 Units total) by mouth every 7 (seven) days.   No facility-administered encounter medications on file as of 10/13/2015.   ALLERGIES: No Known Allergies VACCINATION STATUS: Immunization History  Administered Date(s) Administered  . Influenza Split 09/24/2012  . Influenza Whole 10/04/2009, 09/29/2010, 09/17/2011  . Influenza,inj,Quad PF,36+ Mos 10/29/2013, 10/11/2014, 09/20/2015  . Pneumococcal Conjugate-13 03/17/2015  . Td 03/24/2010  . Zoster 09/20/2011    HPI  64 yr old female with medical hx as above. she is here to f/u for her osteoporosis . she was started on Fosamax , which she is  tolerating well. Her last DXA was in May 2015 which showed T -score of -2.5 on right femoral neck and -2.4 in L4 verteba. she denies any hx of fracture nor loss of height. She has had lumbar disc prolapse for which she underwent surgery in 2002 and 2009. She denies weight loss. she has had menses until her early 5's. has 3 grown children. she denies parathyroid, thyroid dysfunction.  Review of Systems Constitutional: no weight gain/loss, no fatigue, no subjective hyperthermia/hypothermia Eyes: no blurry vision, no xerophthalmia ENT: no sore throat, no nodules palpated in throat, no dysphagia/odynophagia, no hoarseness Cardiovascular: no CP/SOB/palpitations/leg swelling Respiratory: no cough/SOB Gastrointestinal: no  N/V/D/C Musculoskeletal: no muscle/joint aches Skin: no rashes Neurological: no tremors/numbness/tingling/dizziness Psychiatric: no depression/anxiety   Objective:    BP 122/75 mmHg  Pulse 70  Ht 5\' 1"  (1.549 m)  Wt 110 lb (49.896 kg)  BMI 20.80 kg/m2  SpO2 100%  Wt Readings from Last 3 Encounters:  10/13/15 110 lb (49.896 kg)  08/16/15 104 lb 1.9 oz (47.229 kg)  08/08/15 103 lb 9.6 oz (46.993 kg)    Physical Exam  Constitutional: overweight, in NAD Eyes: PERRLA, EOMI, no exophthalmos ENT: moist mucous membranes, no thyromegaly, no cervical lymphadenopathy Cardiovascular: RRR, No MRG Respiratory: CTA B Gastrointestinal: abdomen soft, NT, ND, BS+ Musculoskeletal: no deformities, strength intact in all 4 Skin: moist, warm, no rashes Neurological: no tremor with outstretched hands, DTR normal in all 4   Results for orders placed or performed in visit on 06/21/15  Hemoglobin A1c  Result Value Ref Range   Hgb A1c MFr Bld 5.6 <5.7 %   Mean Plasma Glucose 114 <117 mg/dL  HIV antibody (with reflex)  Result Value Ref Range   HIV 1&2 Ab, 4th Generation NONREACTIVE NONREACTIVE  POC Hemoccult Bld/Stl (1-Cd Office Dx)  Result Value Ref Range   Card #1 Date 06/21/2015    Fecal Occult Blood, POC Negative Negative  Cytology - PAP  Result Value Ref Range   CYTOLOGY - PAP PAP RESULT    Complete Blood Count (Most recent): Lab Results  Component Value Date   WBC 6.6 11/30/2014   HGB 11.5* 11/30/2014   HCT 36.6 11/30/2014   MCV 81.7 11/30/2014   PLT 285 11/30/2014   Chemistry (most recent): Lab Results  Component Value Date   NA 143 11/30/2014   K 3.5* 11/30/2014   CL 104 11/30/2014   CO2 28 11/30/2014   BUN 10 11/30/2014   CREATININE 0.71 11/30/2014   Diabetic Labs (most recent): Lab Results  Component Value Date   HGBA1C 5.6 06/21/2015   HGBA1C 5.9* 03/14/2015   HGBA1C 6.1* 10/05/2014   Lipid profile (most recent): Lab Results  Component Value Date   TRIG 83  03/14/2015   CHOL 174 03/14/2015     Assessment & Plan:   1. Idiopathic osteoporosis  - hers is settled , probably senile osteoporosis . T-Score -2.5 in femoral neck and -2.4 in L4. she denies fracture hx. she is tolerating Fosamax weekly. I advised her to continue on same. Her PTH is normal along with calcium and TFTs.  She has tolerated oral bisphosphonates, she will be continued on same.  Risk of untreated osteoporosis is discussed with her in detail.  she will RTN in 9 months for reevaluation. She will have DXA in May 2017. - Basic metabolic panel - DG DXA BODY COMPOSITION; Future  2. Vitamin D deficiency She will continue with Vitamin D 50 units monthly.  I advised patient to  maintain close follow up with their PCP for primary care needs. Follow up plan: Return in about 6 months (around 04/12/2016) for follow up with pre-visit labs, DXA for follow up of osteoporosis.  Glade Lloyd, MD Phone: [phone]  Fax: [phone]   10/13/2015, 9:43 AM

## 2015-10-14 ENCOUNTER — Other Ambulatory Visit: Payer: Self-pay | Admitting: "Endocrinology

## 2015-10-14 DIAGNOSIS — M810 Age-related osteoporosis without current pathological fracture: Secondary | ICD-10-CM

## 2015-10-16 NOTE — Telephone Encounter (Signed)
i recommend referral for diagnostic right mammogram for breast pain, her mammo not due till 11/2014, pls order test I will sign, let her know

## 2015-10-17 ENCOUNTER — Other Ambulatory Visit: Payer: Self-pay | Admitting: Family Medicine

## 2015-10-17 DIAGNOSIS — N644 Mastodynia: Secondary | ICD-10-CM

## 2015-10-17 NOTE — Telephone Encounter (Signed)
Scheduled for diagnostic mammogram.

## 2015-10-17 NOTE — Addendum Note (Signed)
Addended by: Eual Fines on: 10/17/2015 08:25 AM   Modules accepted: Orders

## 2015-10-21 ENCOUNTER — Ambulatory Visit (HOSPITAL_COMMUNITY): Payer: Self-pay | Admitting: Psychiatry

## 2015-10-25 ENCOUNTER — Encounter (HOSPITAL_COMMUNITY): Payer: Self-pay | Admitting: Psychiatry

## 2015-10-25 ENCOUNTER — Other Ambulatory Visit: Payer: Self-pay | Admitting: Family Medicine

## 2015-10-25 ENCOUNTER — Ambulatory Visit (INDEPENDENT_AMBULATORY_CARE_PROVIDER_SITE_OTHER): Payer: 59 | Admitting: Psychiatry

## 2015-10-25 ENCOUNTER — Ambulatory Visit (HOSPITAL_COMMUNITY)
Admission: RE | Admit: 2015-10-25 | Discharge: 2015-10-25 | Disposition: A | Discharge status: Home / Self Care | Payer: Medicare Other | Source: Ambulatory Visit | Attending: Family Medicine | Admitting: Family Medicine

## 2015-10-25 DIAGNOSIS — F330 Major depressive disorder, recurrent, mild: Secondary | ICD-10-CM | POA: Diagnosis not present

## 2015-10-25 DIAGNOSIS — N644 Mastodynia: Secondary | ICD-10-CM | POA: Diagnosis not present

## 2015-10-25 DIAGNOSIS — N63 Unspecified lump in breast: Secondary | ICD-10-CM | POA: Diagnosis not present

## 2015-10-25 NOTE — Progress Notes (Signed)
    THERAPIST PROGRESS NOTE  Session Time:    Tuesday 10/25/2015 1:06 PM - 1:52 PM        Participation Level: Active   Behavioral Response: CasualAlertAnxious  Type of Therapy: Individual Therapy  Treatment Goals:    1. Learn and implement calming and coping strategies to reduce overall anxiety.      2. Identify, challenge, and replace negative fearful self talk with self talk that facilitates less anxiety and worry  Treatment Goals addressed: 1,2  Interventions: Supportive,CBT  Summary: Megan Gibbs is a 64 y.o. female who is a returning patient to this clinician. She last was seen in November 2014. She is resuming services today because she has faced several stressors in the past few months. Her ex-husband died Apr 25, 2015.  Patient had a mass from her breast in December 2015 but it was not cancerous. Patient also moved in Apr 25, 2015 to a house at the end of a dead end street and reports feeling isolated. She reports having significant weight loss.as she experienced decreased appetite. She reports her youngest daughter stopped having contact with her in June 2016 as she and daughter had conflict regarding patient's furniture. Patient is very upset about this and is stressed even more by the fact that daughter didn't contact her on her birthday last week. She reports additional stress related to legal/financial issues involving the family home.   Patient states continuing to fell better since last session. She has maintained involvement in activity attending church and socializing with family. She reports decreased overall anxiety but continued fear and nervousness regarding driving. She continues to drive but reports starting to become nervous and worried the night before she plans to drive to her destination. She fears having a panic attack.  Patient has been practicing focused breathing and says it has helped.   Suicidal/Homicidal: No  Therapist Response: Therapist works with patient to  identify and verbalize feelings, praise and reinforce use of focused breathing, discuss panic cycle, identify triggers and symptoms of panic attack, identify ways to respond in panic cycle,    Plan: Return again in 2-3 weeks. Patient agrees to practice focused breathing 5-10 minutes 2 times per day, complete focused breathing log, and bring to next session.  Diagnosis: Axis I: Major depressive disorder, recurrent, mild, anxiety disorder    Axis II: Deferred    Aprel Egelhoff, LCSW 10/25/2015

## 2015-10-25 NOTE — Patient Instructions (Signed)
Discussed orallly 

## 2015-11-08 ENCOUNTER — Encounter (HOSPITAL_COMMUNITY): Payer: Self-pay | Admitting: Psychiatry

## 2015-11-08 ENCOUNTER — Ambulatory Visit (INDEPENDENT_AMBULATORY_CARE_PROVIDER_SITE_OTHER): Payer: 59 | Admitting: Psychiatry

## 2015-11-08 VITALS — BP 134/77 | HR 70 | Ht 61.0 in | Wt 109.6 lb

## 2015-11-08 DIAGNOSIS — F330 Major depressive disorder, recurrent, mild: Secondary | ICD-10-CM | POA: Diagnosis not present
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
## 2015-11-10 ENCOUNTER — Encounter (HOSPITAL_COMMUNITY): Payer: Self-pay | Admitting: *Deleted

## 2015-11-16 ENCOUNTER — Telehealth (HOSPITAL_COMMUNITY): Payer: Self-pay | Admitting: *Deleted

## 2015-11-16 NOTE — Telephone Encounter (Signed)
please see Dr. Harrington Challenger note of Nov. 8, 2016 regarding patient.

## 2015-11-18 ENCOUNTER — Ambulatory Visit (HOSPITAL_COMMUNITY): Payer: Self-pay | Admitting: Psychiatry

## 2015-11-29 IMAGING — MG MM DIGITAL SCREENING
4 series · 4 of 4 positions shown · non-contrast
Comparison: Previous Exam(s)

CLINICAL DATA: Screening.

EXAM:
DIGITAL SCREENING BILATERAL MAMMOGRAM WITH CAD

[L CC]
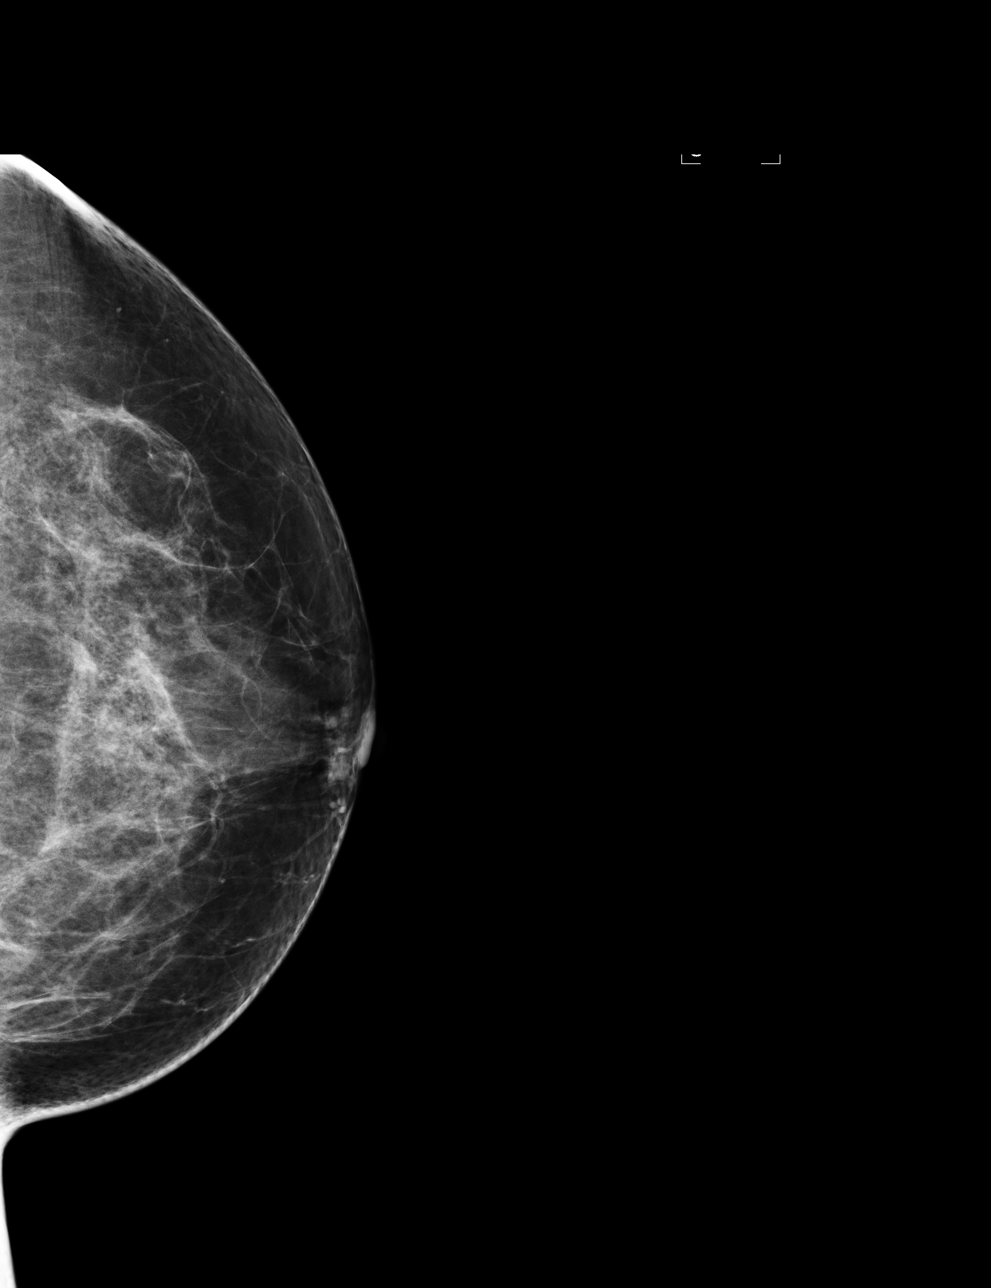

[L MLO]
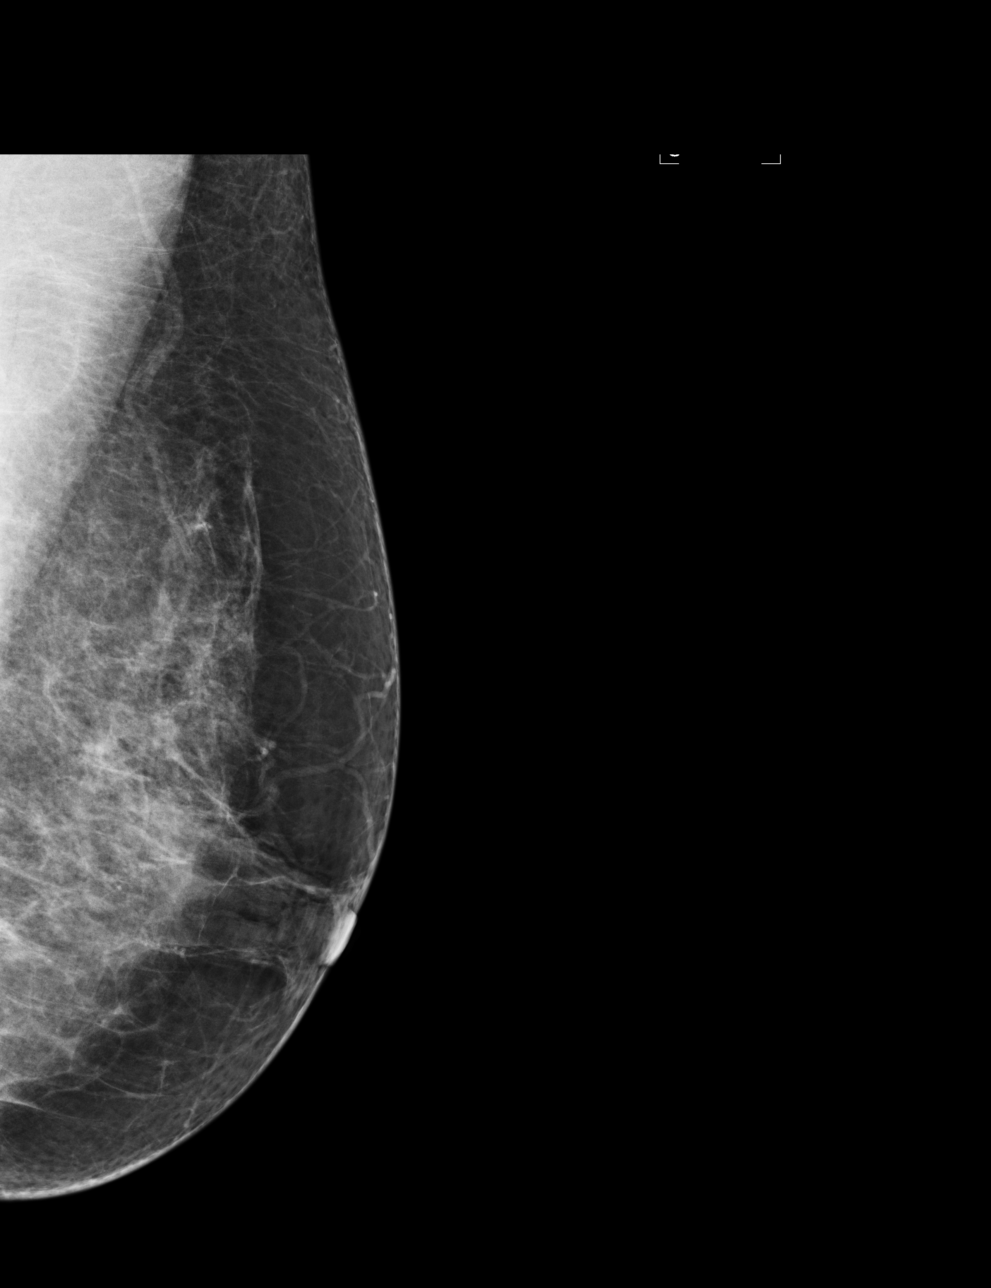

[R CC]
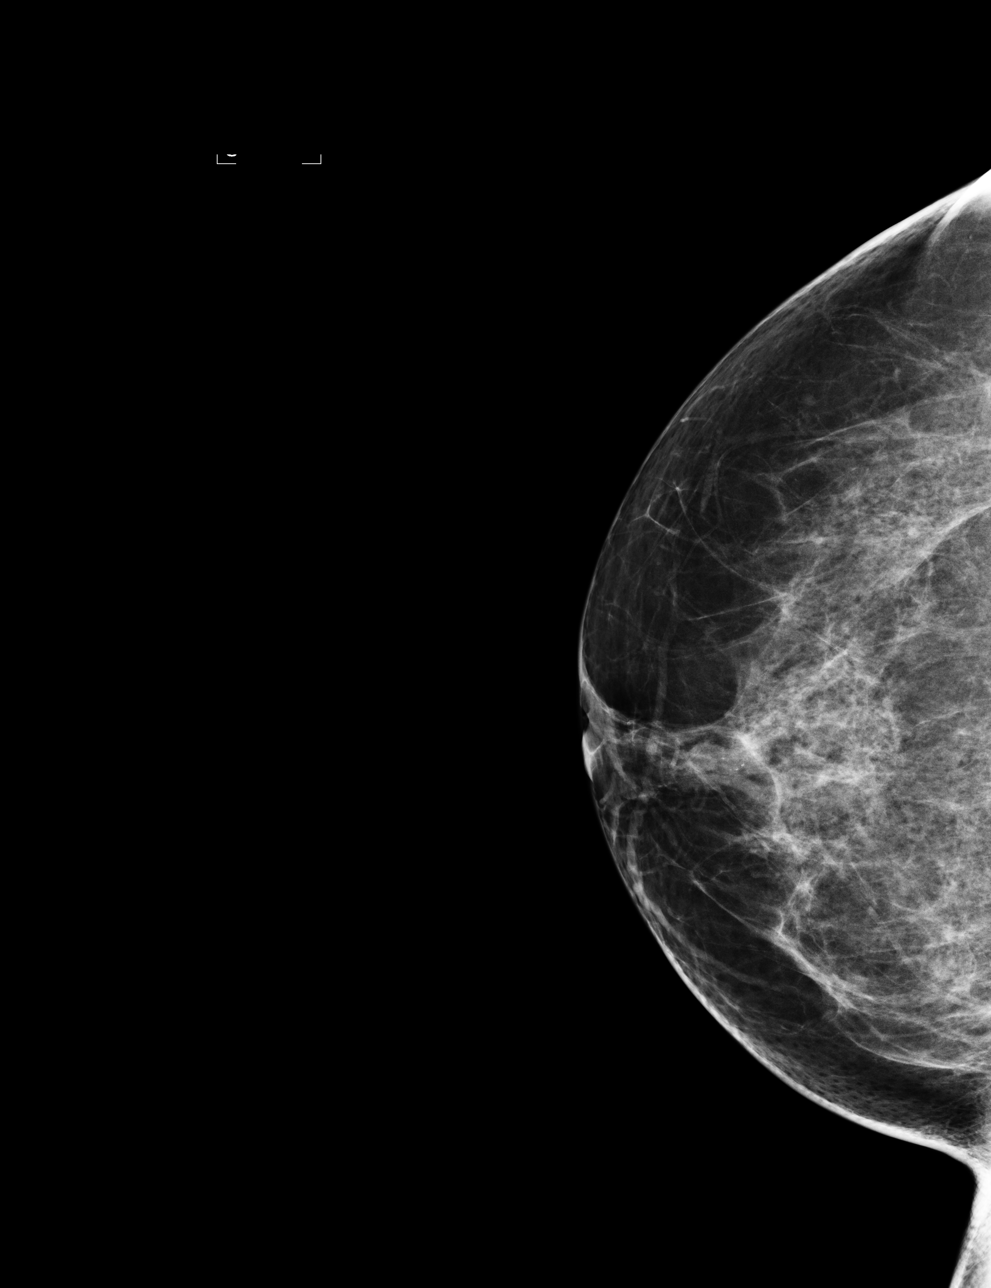

[R MLO]
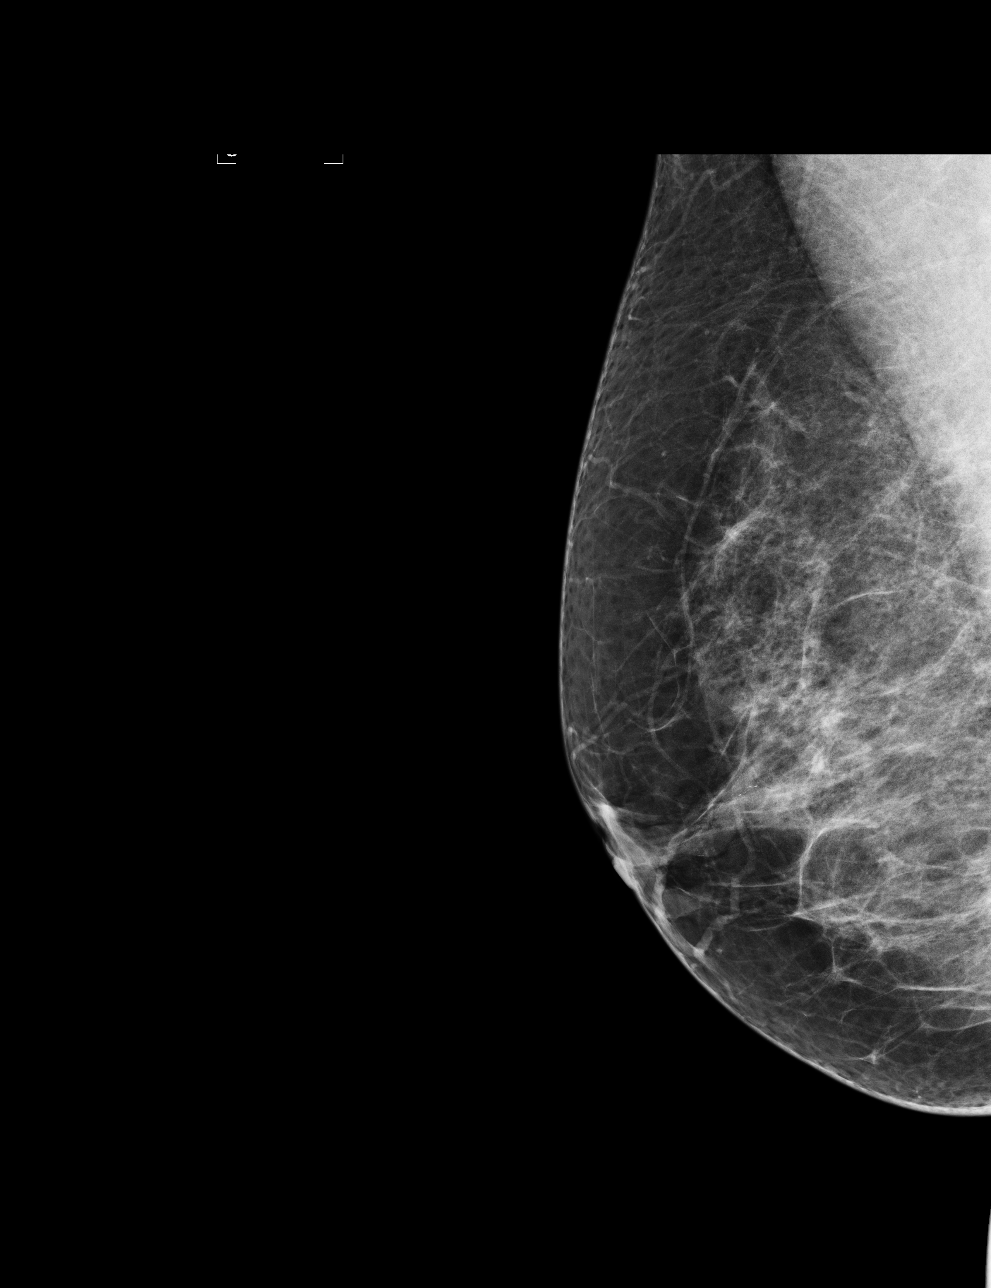

[4 of 4 positions shown; findings below may reference images not displayed]

ACR Breast Density Category b: There are scattered areas of
fibroglandular density.
FINDINGS: In the right breast, calcifications warrant further evaluation with
magnified views. In the left breast, no findings suspicious for
malignancy. Images were processed with CAD.
IMPRESSION: Further evaluation is suggested for calcifications in the right
breast.

RECOMMENDATION:
Diagnostic mammogram of the right breast. (Code:[id number])

The patient will be contacted regarding the findings, and additional
imaging will be scheduled.

BI-RADS CATEGORY  0: Incomplete. Need additional imaging evaluation
and/or prior mammograms for comparison.

## 2015-12-12 DIAGNOSIS — Z13220 Encounter for screening for lipoid disorders: Secondary | ICD-10-CM | POA: Diagnosis not present

## 2015-12-12 DIAGNOSIS — R636 Underweight: Secondary | ICD-10-CM | POA: Diagnosis not present

## 2015-12-12 DIAGNOSIS — R7302 Impaired glucose tolerance (oral): Secondary | ICD-10-CM | POA: Diagnosis not present

## 2015-12-12 DIAGNOSIS — Z1159 Encounter for screening for other viral diseases: Secondary | ICD-10-CM | POA: Diagnosis not present

## 2015-12-12 LAB — CBC
HEMATOCRIT: 37.5 % (ref 36.0–46.0)
HEMOGLOBIN: 12 g/dL (ref 12.0–15.0)
MCH: 27.1 pg (ref 26.0–34.0)
MCHC: 32 g/dL (ref 30.0–36.0)
MCV: 84.7 fL (ref 78.0–100.0)
MPV: 10.7 fL (ref 8.6–12.4)
PLATELETS: 326 10*3/uL (ref 150–400)
RBC: 4.43 MIL/uL (ref 3.87–5.11)
RDW: 14 % (ref 11.5–15.5)
WBC: 5.9 10*3/uL (ref 4.0–10.5)

## 2015-12-13 LAB — COMPREHENSIVE METABOLIC PANEL
ALBUMIN: 4.6 g/dL (ref 3.6–5.1)
ALK PHOS: 59 U/L (ref 33–130)
ALT: 21 U/L (ref 6–29)
AST: 21 U/L (ref 10–35)
BUN: 18 mg/dL (ref 7–25)
CO2: 25 mmol/L (ref 20–31)
Calcium: 9.9 mg/dL (ref 8.6–10.4)
Chloride: 107 mmol/L (ref 98–110)
Creat: 0.87 mg/dL (ref 0.50–0.99)
Glucose, Bld: 86 mg/dL (ref 65–99)
POTASSIUM: 4.1 mmol/L (ref 3.5–5.3)
Sodium: 142 mmol/L (ref 135–146)
TOTAL PROTEIN: 7.4 g/dL (ref 6.1–8.1)
Total Bilirubin: 0.5 mg/dL (ref 0.2–1.2)

## 2015-12-13 LAB — HEPATITIS C ANTIBODY: HCV AB: NEGATIVE

## 2015-12-13 LAB — LIPID PANEL
Cholesterol: 184 mg/dL (ref 125–200)
HDL: 51 mg/dL (ref >=46)
LDL CALC: 118 mg/dL (ref <130)
TRIGLYCERIDES: 75 mg/dL (ref <150)
Total CHOL/HDL Ratio: 3.6 Ratio (ref <=5.0)
VLDL: 15 mg/dL (ref <30)

## 2015-12-13 LAB — HEMOGLOBIN A1C
HEMOGLOBIN A1C: 5.6 % (ref <5.7)
MEAN PLASMA GLUCOSE: 114 mg/dL (ref <117)

## 2015-12-19 ENCOUNTER — Encounter: Payer: Self-pay | Admitting: Family Medicine

## 2015-12-19 ENCOUNTER — Ambulatory Visit (INDEPENDENT_AMBULATORY_CARE_PROVIDER_SITE_OTHER): Payer: Medicare Other | Admitting: Family Medicine

## 2015-12-19 VITALS — BP 108/70 | HR 78 | Resp 16 | Ht 61.0 in | Wt 110.1 lb

## 2015-12-19 DIAGNOSIS — R630 Anorexia: Secondary | ICD-10-CM | POA: Diagnosis not present

## 2015-12-19 DIAGNOSIS — R636 Underweight: Secondary | ICD-10-CM | POA: Diagnosis not present

## 2015-12-19 DIAGNOSIS — D241 Benign neoplasm of right breast: Secondary | ICD-10-CM

## 2015-12-19 DIAGNOSIS — F411 Generalized anxiety disorder: Secondary | ICD-10-CM

## 2015-12-19 NOTE — Patient Instructions (Signed)
Annual wellness in 4 month, all if you need me before   Megace for 2 months only , work on good eating habits  Thankful that you are doing better  All the best for 2017!  Excellent labs  Thanks for choosing [facility], we consider it a privelige to serve you.

## 2015-12-19 NOTE — Progress Notes (Signed)
   Subjective:    Patient ID: Megan Gibbs, female    DOB: 1951-01-09, 64 y.o.   MRN: [id number]  HPI   [name]     MRN: [id number]      DOB: 1951/06/18   HPI [name] is here for follow up and re-evaluation of chronic medical conditions, medication management and review of any available recent lab and radiology data.  Preventive health is updated, specifically  Cancer screening and Immunization.   Questions or concerns regarding consultations or procedures which the PT has had in the interim are  addressed. Patient elected to change psych care felt as though her medication "was not agreeing with her, reported oversedation with klonopin," and feels better on current regime  ROS Denies recent fever or chills. Denies sinus pressure, nasal congestion, ear pain or sore throat. Denies chest congestion, productive cough or wheezing. Denies chest pains, palpitations and leg swelling Denies abdominal pain, nausea, vomiting,diarrhea or constipation.   Denies dysuria, frequency, hesitancy or incontinence. Denies joint pain, swelling and limitation in mobility. Denies headaches, seizures, numbness, or tingling. Denies depression, anxiety or insomnia. Denies skin break down or rash.   PE  BP 108/70 mmHg  Pulse 78  Resp 16  Ht 5\' 1"  (1.549 m)  Wt 110 lb 1.9 oz (49.95 kg)  BMI 20.82 kg/m2  SpO2 98%  Patient alert and oriented and in no cardiopulmonary distress.  HEENT: No facial asymmetry, EOMI,   oropharynx pink and moist.  Neck supple no JVD, no mass.  Chest: Clear to auscultation bilaterally.  CVS: S1, S2 no murmurs, no S3.Regular rate.  ABD: Soft non tender.   Ext: No edema  MS: Adequate ROM spine, shoulders, hips and knees.  Skin: Intact, no ulcerations or rash noted.  Psych: Good eye contact, normal affect. Memory intact not anxious or depressed appearing.  CNS: CN 2-12 intact, power,  normal throughout.no focal deficits noted.   Assessment & Plan   DEPRESSION,  SEVERE Improved and controlled. Pt has opted to seek psych care at another facility and  She has made previous Provider aware of this. She feels as though she is improved with less adverse s/e on current medication  Anxiety state Reports improvement and is currently on no anxiolytic, states she has resumed driving short distances once more also  Poor appetite Improved wit megace wants to continue this, was on remron but has discontinued this. Will continue lower dose megace for an additional 4 months, discussed need to d/c in the future , she understands and agrees. She is to continue seeing nutritionist  Underweight Goals set by the patient for the next several months. Currently stable and improved   Weight /BMI 12/19/2015 10/13/2015 08/16/2015  WEIGHT 110 lb 1.9 oz 110 lb 104 lb 1.9 oz  HEIGHT 5\' 1"  5\' 1"  5\' 1"   BMI 20.82 kg/m2 20.8 kg/m2 19.68 kg/m2  Some encounter information is confidential and restricted. Go to Review Flowsheets activity to see all data.      Papilloma of right breast Continue close monitoring, she has a f/h breast cancer in first degree relative       Review of Systems     Objective:   Physical Exam        Assessment & Plan:

## 2016-01-02 NOTE — Assessment & Plan Note (Signed)
Improved wit megace wants to continue this, was on remron but has discontinued this. Will continue lower dose megace for an additional 4 months, discussed need to d/c in the future , she understands and agrees. She is to continue seeing nutritionist

## 2016-01-02 NOTE — Assessment & Plan Note (Signed)
Improved and controlled. Pt has opted to seek psych care at another facility and  She has made previous Provider aware of this. She feels as though she is improved with less adverse s/e on current medication

## 2016-01-02 NOTE — Assessment & Plan Note (Signed)
Reports improvement and is currently on no anxiolytic, states she has resumed driving short distances once more also

## 2016-01-02 NOTE — Assessment & Plan Note (Signed)
Continue close monitoring, she has a f/h breast cancer in first degree relative

## 2016-01-02 NOTE — Assessment & Plan Note (Signed)
Goals set by the patient for the next several months. Currently stable and improved   Weight /BMI 12/19/2015 10/13/2015 08/16/2015  WEIGHT 110 lb 1.9 oz 110 lb 104 lb 1.9 oz  HEIGHT 5\' 1"  5\' 1"  5\' 1"   BMI 20.82 kg/m2 20.8 kg/m2 19.68 kg/m2  Some encounter information is confidential and restricted. Go to Review Flowsheets activity to see all data.

## 2016-01-18 DIAGNOSIS — F419 Anxiety disorder, unspecified: Secondary | ICD-10-CM | POA: Diagnosis not present

## 2016-03-19 ENCOUNTER — Telehealth: Payer: Self-pay | Admitting: "Endocrinology

## 2016-03-19 NOTE — Telephone Encounter (Signed)
LM to reschedule 4/14 apt

## 2016-03-27 ENCOUNTER — Encounter: Payer: Self-pay | Admitting: Internal Medicine

## 2016-04-08 ENCOUNTER — Telehealth: Payer: Self-pay | Admitting: Family Medicine

## 2016-04-08 NOTE — Telephone Encounter (Signed)
pls schedule and give pt appt info for her f/u right breast imaging which has already been ordered , due this month thanks!( had abn 6 months ago)

## 2016-04-09 ENCOUNTER — Other Ambulatory Visit: Payer: Self-pay | Admitting: "Endocrinology

## 2016-04-09 DIAGNOSIS — M818 Other osteoporosis without current pathological fracture: Secondary | ICD-10-CM

## 2016-04-09 DIAGNOSIS — E559 Vitamin D deficiency, unspecified: Secondary | ICD-10-CM

## 2016-04-10 ENCOUNTER — Other Ambulatory Visit: Payer: Self-pay

## 2016-04-10 DIAGNOSIS — N6310 Unspecified lump in the right breast, unspecified quadrant: Secondary | ICD-10-CM

## 2016-04-10 NOTE — Telephone Encounter (Signed)
Patient is scheduled on Tuesday Apri 18 th at 2:00

## 2016-04-12 DIAGNOSIS — E559 Vitamin D deficiency, unspecified: Secondary | ICD-10-CM | POA: Diagnosis not present

## 2016-04-12 DIAGNOSIS — M818 Other osteoporosis without current pathological fracture: Secondary | ICD-10-CM | POA: Diagnosis not present

## 2016-04-13 ENCOUNTER — Other Ambulatory Visit (HOSPITAL_COMMUNITY): Payer: Self-pay

## 2016-04-13 ENCOUNTER — Ambulatory Visit: Payer: Medicare Other | Admitting: "Endocrinology

## 2016-04-13 LAB — VITAMIN D 25 HYDROXY (VIT D DEFICIENCY, FRACTURES): VIT D 25 HYDROXY: 63 ng/mL (ref 30–100)

## 2016-04-13 LAB — PTH, INTACT AND CALCIUM
Calcium: 9.6 mg/dL (ref 8.4–10.5)
PTH: 34 pg/mL (ref 14–64)

## 2016-04-16 ENCOUNTER — Ambulatory Visit: Payer: Medicare Other | Admitting: "Endocrinology

## 2016-04-17 ENCOUNTER — Ambulatory Visit (HOSPITAL_COMMUNITY)
Admission: RE | Admit: 2016-04-17 | Discharge: 2016-04-17 | Disposition: A | Discharge status: Home / Self Care | Payer: PPO | Source: Ambulatory Visit | Attending: Family Medicine | Admitting: Family Medicine

## 2016-04-17 ENCOUNTER — Encounter (HOSPITAL_COMMUNITY): Payer: Self-pay

## 2016-04-17 DIAGNOSIS — N63 Unspecified lump in breast: Secondary | ICD-10-CM | POA: Diagnosis not present

## 2016-04-17 DIAGNOSIS — N6310 Unspecified lump in the right breast, unspecified quadrant: Secondary | ICD-10-CM

## 2016-04-17 DIAGNOSIS — N6001 Solitary cyst of right breast: Secondary | ICD-10-CM | POA: Diagnosis not present

## 2016-04-17 DIAGNOSIS — N6489 Other specified disorders of breast: Secondary | ICD-10-CM | POA: Diagnosis not present

## 2016-04-19 ENCOUNTER — Ambulatory Visit (HOSPITAL_COMMUNITY)
Admission: RE | Admit: 2016-04-19 | Discharge: 2016-04-19 | Disposition: A | Discharge status: Home / Self Care | Payer: PPO | Source: Ambulatory Visit | Attending: "Endocrinology | Admitting: "Endocrinology

## 2016-04-19 DIAGNOSIS — M810 Age-related osteoporosis without current pathological fracture: Secondary | ICD-10-CM | POA: Diagnosis not present

## 2016-04-19 DIAGNOSIS — Z780 Asymptomatic menopausal state: Secondary | ICD-10-CM | POA: Diagnosis not present

## 2016-04-24 ENCOUNTER — Encounter: Payer: Self-pay | Admitting: "Endocrinology

## 2016-04-24 ENCOUNTER — Ambulatory Visit (INDEPENDENT_AMBULATORY_CARE_PROVIDER_SITE_OTHER): Payer: PPO | Admitting: "Endocrinology

## 2016-04-24 VITALS — BP 112/76 | HR 74 | Ht 61.0 in | Wt 111.0 lb

## 2016-04-24 DIAGNOSIS — R7303 Prediabetes: Secondary | ICD-10-CM

## 2016-04-24 DIAGNOSIS — M810 Age-related osteoporosis without current pathological fracture: Secondary | ICD-10-CM | POA: Diagnosis not present

## 2016-04-24 MED ORDER — ALENDRONATE SODIUM 70 MG PO TABS: 70.0000 mg | ORAL_TABLET | ORAL | Status: DC

## 2016-04-24 NOTE — Progress Notes (Signed)
Subjective:    Patient ID: Megan Gibbs, female    DOB: 05/18/1951,    Past Medical History  Diagnosis Date  . Hypokalemia   . Allergic rhinitis   . Back pain   . Vertigo   . Depression   . Anxiety   . Anemia   . Osteoporosis   . Seasonal allergies   . Wears glasses   . Prediabetes    Past Surgical History  Procedure Laterality Date  . Back surgery  1992 & 2009    Dr. Joya Salm   . Tubal ligation      1976  . Colonoscopy N/A 04/19/2014    Procedure: COLONOSCOPY;  Surgeon: Daneil Dolin, MD;  [facility];  Service: Endoscopy;  Laterality: N/A;  9:30 AM  . Dilation and curettage of uterus    . Breast lumpectomy with radioactive seed localization Right 12/01/2014    Procedure: RIGHT BREAST LUMPECTOMY WITH RADIOACTIVE SEED LOCALIZATION;  Surgeon: Fanny Skates, MD;  Location: [place];  Service: General;  Laterality: Right;   Social History   Social History  . Marital Status: Married    Spouse Name: N/A  . Number of Children: 3  . Years of Education: N/A   Occupational History  . disabled     Social History Main Topics  . Smoking status: Never Smoker   . Smokeless tobacco: Never Used  . Alcohol Use: No  . Drug Use: No  . Sexual Activity: No   Other Topics Concern  . None   Social History Narrative   Outpatient Encounter Prescriptions as of 04/24/2016  Medication Sig  . alendronate (FOSAMAX) 70 MG tablet Take 1 tablet (70 mg total) by mouth once a week. Take with a full glass of water on an empty stomach.  [name] aspirin (ASPIRIN LOW DOSE) 81 MG EC tablet Take 81 mg by mouth daily.    . calcium-vitamin D (OSCAL WITH D) 500-200 MG-UNIT per tablet Take 1 tablet by mouth daily with breakfast.  . cetirizine (ZYRTEC) 10 MG chewable tablet Chew 10 mg by mouth daily.  . megestrol (MEGACE) 40 MG tablet Take 1 tablet (40 mg total) by mouth daily.  . Multiple Vitamins-Minerals (CENTRUM SILVER PO) Take by mouth.  . traZODone (DESYREL) 100 MG  tablet Take 100 mg by mouth at bedtime.  . [DISCONTINUED] alendronate (FOSAMAX) 70 MG tablet Take 1 tablet (70 mg total) by mouth once a week. Take with a full glass of water on an empty stomach.  . ferrous sulfate 325 (65 FE) MG tablet Take 325 mg by mouth daily with breakfast. Reported on 04/24/2016  . Vitamin D, Ergocalciferol, (DRISDOL) 50000 UNITS CAPS capsule Take 1 capsule (50,000 Units total) by mouth every 28 (twenty-eight) days. (Patient not taking: Reported on 04/24/2016)   No facility-administered encounter medications on file as of 04/24/2016.   ALLERGIES: No Known Allergies VACCINATION STATUS: Immunization History  Administered Date(s) Administered  . Influenza Split 09/24/2012  . Influenza Whole 10/04/2009, 09/29/2010, 09/17/2011  . Influenza,inj,Quad PF,36+ Mos 10/29/2013, 10/11/2014, 09/20/2015  . Pneumococcal Conjugate-13 03/17/2015  . Td 03/24/2010  . Zoster 09/20/2011    HPI  65 yr old female with medical hx as above. she is here to f/u for her osteoporosis . she was started on Fosamax , which she is tolerating well. Her last DXA was in May 2015 which showed T -score of -2.5 on right femoral neck and -2.4 in L4 verteba. she denies any hx of  fracture nor loss of height. -Her repeat bone density from a previous 2017 was reported as no statistical difference compared to the 1 from 2015. She has had lumbar disc prolapse for which she underwent surgery in 2002 and 2009. She denies weight loss. she has had menses until her early 37's. has 3 grown children. she denies parathyroid, thyroid dysfunction.  Review of Systems Constitutional: no weight gain/loss, no fatigue, no subjective hyperthermia/hypothermia Eyes: no blurry vision, no xerophthalmia ENT: no sore throat, no nodules palpated in throat, no dysphagia/odynophagia, no hoarseness Cardiovascular: no CP/SOB/palpitations/leg swelling Respiratory: no cough/SOB Gastrointestinal: no N/V/D/C Musculoskeletal: no  muscle/joint aches Skin: no rashes Neurological: no tremors/numbness/tingling/dizziness Psychiatric: no depression/anxiety   Objective:    BP 112/76 mmHg  Pulse 74  Ht 5\' 1"  (1.549 m)  Wt 111 lb (50.349 kg)  BMI 20.98 kg/m2  SpO2 98%  Wt Readings from Last 3 Encounters:  04/24/16 111 lb (50.349 kg)  12/19/15 110 lb 1.9 oz (49.95 kg)  11/08/15 109 lb 9.6 oz (49.714 kg)    Physical Exam  Constitutional: in NAD Eyes: PERRLA, EOMI, no exophthalmos ENT: moist mucous membranes, no thyromegaly, no cervical lymphadenopathy Cardiovascular: RRR, No MRG Respiratory: CTA B Gastrointestinal: abdomen soft, NT, ND, BS+ Musculoskeletal: no deformities, strength intact in all 4 Skin: moist, warm, no rashes Neurological: no tremor with outstretched hands, DTR normal in all 4   Results for orders placed or performed in visit on 04/09/16  PTH, Intact and Calcium  Result Value Ref Range   PTH 34 14 - 64 pg/mL   Calcium 9.6 8.4 - 10.5 mg/dL  Vitamin D, 25-hydroxy  Result Value Ref Range   Vit D, 25-Hydroxy 63 30 - 100 ng/mL   Complete Blood Count (Most recent): Lab Results  Component Value Date   WBC 5.9 12/12/2015   HGB 12.0 12/12/2015   HCT 37.5 12/12/2015   MCV 84.7 12/12/2015   PLT 326 12/12/2015   Chemistry (most recent): Lab Results  Component Value Date   NA 142 12/12/2015   K 4.1 12/12/2015   CL 107 12/12/2015   CO2 25 12/12/2015   BUN 18 12/12/2015   CREATININE 0.87 12/12/2015   Diabetic Labs (most recent): Lab Results  Component Value Date   HGBA1C 5.6 12/12/2015   HGBA1C 5.6 06/21/2015   HGBA1C 5.9* 03/14/2015   Lipid profile (most recent): Lab Results  Component Value Date   TRIG 75 12/12/2015   CHOL 184 12/12/2015     Assessment & Plan:   1. Idiopathic osteoporosis  - hers is settled , probably senile osteoporosis . T-Score -2.5 in femoral neck and -2.4 in L4. she denies fracture hx. she is tolerating Fosamax weekly. -Repeat DEXA was reported as  no status Difference. This is probably a good development for her since she has not lost significant bone density since 2015. I advised her to continue on same. Her PTH is normal along with calcium and TFTs.  She has tolerated oral bisphosphonates, she will be continued on same.  Risk of untreated osteoporosis is discussed with her in detail.    2. Vitamin D deficiency She will continue with Vitamin D 50 units monthly.  I advised patient to maintain close follow up with their PCP for primary care needs. Follow up plan: Return in about 1 year (around 04/24/2017) for follow up with pre-visit labs.  Glade Lloyd, MD Phone: [phone]  Fax: [phone]   04/24/2016, 11:45 AM

## 2016-05-02 ENCOUNTER — Encounter: Payer: Self-pay | Admitting: Family Medicine

## 2016-05-02 ENCOUNTER — Ambulatory Visit (INDEPENDENT_AMBULATORY_CARE_PROVIDER_SITE_OTHER): Payer: PPO | Admitting: Family Medicine

## 2016-05-02 VITALS — BP 122/60 | HR 72 | Resp 18 | Ht 61.0 in | Wt 109.0 lb

## 2016-05-02 DIAGNOSIS — D509 Iron deficiency anemia, unspecified: Secondary | ICD-10-CM | POA: Diagnosis not present

## 2016-05-02 DIAGNOSIS — Z0000 Encounter for general adult medical examination without abnormal findings: Secondary | ICD-10-CM

## 2016-05-02 NOTE — Assessment & Plan Note (Signed)

## 2016-05-02 NOTE — Progress Notes (Signed)
Subjective:    Patient ID: Megan Gibbs, female    DOB: Mar 06, 1951, 65 y.o.   MRN: [id number]  HPI Preventive Screening-Counseling & Management   Patient present here today for a Medicare annual wellness visit.   Current Problems (verified)   Medications Prior to Visit Allergies (verified)   PAST HISTORY  Family History (updated)  Social History Disabled, divorced female.  Formerly worked in [employer].  Mother of 3 daughters    Risk Factors  Current exercise habits:  Does cardio 5 days a week for 20 minutes and strengthening exercises daily   Dietary issues discussed:  Heart healthy; will work on increasing fresh vegetables and fruits since it is in season    Cardiac risk factors:   Depression Screen  (Note: if answer to either of the following is "Yes", a more complete depression screening is indicated)   Is currently treated by psychiatry   Over the past two weeks, have you felt down, depressed or hopeless? No  Over the past two weeks, have you felt little interest or pleasure in doing things? No  Have you lost interest or pleasure in daily life? No  Do you often feel hopeless? No  Do you cry easily over simple problems? No   Activities of Daily Living  In your present state of health, do you have any difficulty performing the following activities?  Driving?: No but only drives short distances within her town  Managing money?: No Feeding yourself?:No Getting from bed to chair?:No Climbing a flight of stairs?:No Preparing food and eating?:No Bathing or showering?:No Getting dressed?:No Getting to the toilet?:No Using the toilet?:No Moving around from place to place?: No  Fall Risk Assessment In the past year have you fallen or had a near fall?:No Are you currently taking any medications that make you dizzy?:No   Hearing Difficulties: No Do you often ask people to speak up or repeat themselves?:No Do you experience ringing or noises in your ears?:No Do you  have difficulty understanding soft or whispered voices?:No  Cognitive Testing  Alert? Yes Normal Appearance?Yes  Oriented to person? Yes Place? Yes  Time? Yes  Displays appropriate judgment?Yes  Can read the correct time from a watch face? yes Are you having problems remembering things?No age appropriate   Advanced Directives have been discussed with the patient?Yes and forms provided (she will discuss with children)     List the Names of Other Physician/Practitioners you currently use: careteams updated    Indicate any recent Medical Services you may have received from other than [facility] providers in the past year (date may be approximate).   Assessment:    Annual Wellness Exam   Plan:     Patient Instructions (the written plan) was given to the patient.  Medicare Attestation  I have personally reviewed:  The patient's medical and social history  Their use of alcohol, tobacco or illicit drugs  Their current medications and supplements  The patient's functional ability including ADLs,fall risks, home safety risks, cognitive, and hearing and visual impairment  Diet and physical activities  Evidence for depression or mood disorders  The patient's weight, height, BMI, and visual acuity have been recorded in the chart. I have made referrals, counseling, and provided education to the patient based on review of the above and I have provided the patient with a written personalized care plan for preventive services.     Review of Systems     Objective:   Physical Exam  BP 122/60 mmHg  Pulse 72  Resp 18  Ht 5\' 1"  (1.549 m)  Wt 109 lb (49.442 kg)  BMI 20.61 kg/m2  SpO2 100%      Assessment & Plan:  Medicare annual wellness visit, subsequent Annual exam as documented. Counseling done  re healthy lifestyle involving commitment to 150 minutes exercise per week, heart healthy diet, and attaining healthy weight.The importance of adequate sleep also discussed. Regular seat belt  use and home safety, is also discussed. Changes in health habits are decided on by the patient with goals and time frames  set for achieving them. Immunization and cancer screening needs are specifically addressed at this visit.

## 2016-05-02 NOTE — Patient Instructions (Addendum)
Annual physical exam in 4 month, call if you need me sooner  CBC, iron and ferritin in 4 months, 5 days before visit  Reduce iron tablets to one tab 3 times  Per week, Mon, Wed, Friday   Continue Boost as before, and keep active  Thank you  for choosing [facility]. We consider it a privelige to serve you.  Delivering excellent health care in a caring and  compassionate way is our goal.  Partnering with you,  so that together we can achieve this goal is our strategy.       Fall Prevention in the Home  Falls can cause injuries. They can happen to people of all ages. There are many things you can do to make your home safe and to help prevent falls.  WHAT CAN I DO ON THE OUTSIDE OF MY HOME?  Regularly fix the edges of walkways and driveways and fix any cracks.  Remove anything that might make you trip as you walk through a door, such as a raised step or threshold.  Trim any bushes or trees on the path to your home.  Use bright outdoor lighting.  Clear any walking paths of anything that might make someone trip, such as rocks or tools.  Regularly check to see if handrails are loose or broken. Make sure that both sides of any steps have handrails.  Any raised decks and porches should have guardrails on the edges.  Have any leaves, snow, or ice cleared regularly.  Use sand or salt on walking paths during winter.  Clean up any spills in your garage right away. This includes oil or grease spills. WHAT CAN I DO IN THE BATHROOM?   Use night lights.  Install grab bars by the toilet and in the tub and shower. Do not use towel bars as grab bars.  Use non-skid mats or decals in the tub or shower.  If you need to sit down in the shower, use a plastic, non-slip stool.  Keep the floor dry. Clean up any water that spills on the floor as soon as it happens.  Remove soap buildup in the tub or shower regularly.  Attach bath mats securely with double-sided non-slip rug  tape.  Do not have throw rugs and other things on the floor that can make you trip. WHAT CAN I DO IN THE BEDROOM?  Use night lights.  Make sure that you have a light by your bed that is easy to reach.  Do not use any sheets or blankets that are too big for your bed. They should not hang down onto the floor.  Have a firm chair that has side arms. You can use this for support while you get dressed.  Do not have throw rugs and other things on the floor that can make you trip. WHAT CAN I DO IN THE KITCHEN?  Clean up any spills right away.  Avoid walking on wet floors.  Keep items that you use a lot in easy-to-reach places.  If you need to reach something above you, use a strong step stool that has a grab bar.  Keep electrical cords out of the way.  Do not use floor polish or wax that makes floors slippery. If you must use wax, use non-skid floor wax.  Do not have throw rugs and other things on the floor that can make you trip. WHAT CAN I DO WITH MY STAIRS?  Do not leave any items on the stairs.  Make  sure that there are handrails on both sides of the stairs and use them. Fix handrails that are broken or loose. Make sure that handrails are as long as the stairways.  Check any carpeting to make sure that it is firmly attached to the stairs. Fix any carpet that is loose or worn.  Avoid having throw rugs at the top or bottom of the stairs. If you do have throw rugs, attach them to the floor with carpet tape.  Make sure that you have a light switch at the top of the stairs and the bottom of the stairs. If you do not have them, ask someone to add them for you. WHAT ELSE CAN I DO TO HELP PREVENT FALLS?  Wear shoes that:  Do not have high heels.  Have rubber bottoms.  Are comfortable and fit you well.  Are closed at the toe. Do not wear sandals.  If you use a stepladder:  Make sure that it is fully opened. Do not climb a closed stepladder.  Make sure that both sides of the  stepladder are locked into place.  Ask someone to hold it for you, if possible.  Clearly mark and make sure that you can see:  Any grab bars or handrails.  First and last steps.  Where the edge of each step is.  Use tools that help you move around (mobility aids) if they are needed. These include:  Canes.  Walkers.  Scooters.  Crutches.  Turn on the lights when you go into a dark area. Replace any light bulbs as soon as they burn out.  Set up your furniture so you have a clear path. Avoid moving your furniture around.  If any of your floors are uneven, fix them.  If there are any pets around you, be aware of where they are.  Review your medicines with your doctor. Some medicines can make you feel dizzy. This can increase your chance of falling. Ask your doctor what other things that you can do to help prevent falls.   This information is not intended to replace advice given to you by your health care provider. Make sure you discuss any questions you have with your health care provider.   Document Released: 10/13/2009 Document Revised: 05/03/2015 Document Reviewed: 01/21/2015 Elsevier Interactive Patient Education [employer].

## 2016-05-09 DIAGNOSIS — F329 Major depressive disorder, single episode, unspecified: Secondary | ICD-10-CM | POA: Diagnosis not present

## 2016-07-18 ENCOUNTER — Ambulatory Visit (HOSPITAL_COMMUNITY)
Admission: RE | Admit: 2016-07-18 | Discharge: 2016-07-18 | Disposition: A | Discharge status: Home / Self Care | Payer: PPO | Source: Ambulatory Visit | Attending: Orthopaedic Surgery | Admitting: Orthopaedic Surgery

## 2016-07-18 ENCOUNTER — Encounter: Payer: Self-pay | Admitting: Orthopaedic Surgery

## 2016-07-18 ENCOUNTER — Ambulatory Visit (INDEPENDENT_AMBULATORY_CARE_PROVIDER_SITE_OTHER): Payer: PPO | Admitting: Orthopaedic Surgery

## 2016-07-18 VITALS — BP 123/68 | HR 69 | Temp 97.7°F | Ht 62.0 in | Wt 109.0 lb

## 2016-07-18 DIAGNOSIS — M25512 Pain in left shoulder: Secondary | ICD-10-CM | POA: Diagnosis not present

## 2016-07-18 DIAGNOSIS — M19012 Primary osteoarthritis, left shoulder: Secondary | ICD-10-CM | POA: Insufficient documentation

## 2016-07-18 MED ORDER — NAPROXEN 500 MG PO TABS: 500.0000 mg | ORAL_TABLET | Freq: Two times a day (BID) | ORAL | Status: DC

## 2016-07-18 MED ORDER — HYDROCODONE-ACETAMINOPHEN 5-325 MG PO TABS: 1.0000 | ORAL_TABLET | ORAL | Status: DC | PRN

## 2016-07-18 NOTE — Progress Notes (Signed)
Subjective: My left shoulder is hurting    Patient ID: Megan Gibbs, female    DOB: 1951-07-30, 65 y.o.   MRN: [id number]  HPI She has had left shoulder pain for several months.  I saw her in 2013 for the left shoulder and injected it then.  She has done well until now.  She has no new trauma, no paresthesias.  She has pain when using her hand on the left above her head.  She has no swelling.  She has no redness.  The shoulder has been getting gradually worse.  She has tired ice, heat, rubs and Advil with little help.  She also has some back pain.  She is not taking the Advil regularly.  She has no paresthesias.   Review of Systems  HENT: Negative for congestion.   Respiratory: Negative for cough and shortness of breath.   Cardiovascular: Negative for chest pain and leg swelling.  Endocrine: Positive for cold intolerance.  Musculoskeletal: Positive for back pain and arthralgias.  Allergic/Immunologic: Positive for environmental allergies.  Psychiatric/Behavioral: The patient is nervous/anxious.    Past Medical History  Diagnosis Date  . Hypokalemia   . Allergic rhinitis   . Back pain   . Vertigo   . Depression   . Anxiety   . Anemia   . Osteoporosis   . Seasonal allergies   . Wears glasses   . Prediabetes     Past Surgical History  Procedure Laterality Date  . Back surgery  1992 & 2009    Dr. Joya Salm   . Tubal ligation      1976  . Colonoscopy N/A 04/19/2014    Procedure: COLONOSCOPY;  Surgeon: Daneil Dolin, MD;  [facility];  Service: Endoscopy;  Laterality: N/A;  9:30 AM  . Dilation and curettage of uterus    . Breast lumpectomy with radioactive seed localization Right 12/01/2014    Procedure: RIGHT BREAST LUMPECTOMY WITH RADIOACTIVE SEED LOCALIZATION;  Surgeon: Fanny Skates, MD;  Location: [place];  Service: General;  Laterality: Right;    Current Outpatient Prescriptions on File Prior to Visit  Medication Sig Dispense Refill  .  alendronate (FOSAMAX) 70 MG tablet Take 1 tablet (70 mg total) by mouth once a week. Take with a full glass of water on an empty stomach. 12 tablet 4  . aspirin (ASPIRIN LOW DOSE) 81 MG EC tablet Take 81 mg by mouth daily.      . calcium-vitamin D (OSCAL WITH D) 500-200 MG-UNIT per tablet Take 1 tablet by mouth daily with breakfast.    . cetirizine (ZYRTEC) 10 MG chewable tablet Chew 10 mg by mouth daily.    . ferrous sulfate 325 (65 FE) MG tablet Take 325 mg by mouth daily with breakfast. Reported on 04/24/2016    . Multiple Vitamins-Minerals (CENTRUM SILVER PO) Take by mouth.    . traZODone (DESYREL) 100 MG tablet Take 100 mg by mouth at bedtime.    . Vitamin D, Ergocalciferol, (DRISDOL) 50000 UNITS CAPS capsule Take 1 capsule (50,000 Units total) by mouth every 28 (twenty-eight) days. 4 capsule 5   No current facility-administered medications on file prior to visit.    Social History   Social History  . Marital Status: Married    Spouse Name: N/A  . Number of Children: 3  . Years of Education: N/A   Occupational History  . disabled     Social History Main Topics  . Smoking status: Never Smoker   .  Smokeless tobacco: Never Used  . Alcohol Use: No  . Drug Use: No  . Sexual Activity: No   Other Topics Concern  . Not on file   Social History Narrative    Family History  Problem Relation Age of Onset  . Diabetes Mother   . Hypertension Mother   . Asthma Sister   . Thyroid disease Sister   . Diabetes Sister   . Hypertension Sister   . Schizophrenia Father   . Anxiety disorder Maternal Aunt   . Drug abuse Brother   . Alcohol abuse Brother   . Sleep apnea Grandchild   . ADD / ADHD Grandchild   . Seizures Grandchild   . Colon cancer Neg Hx     BP 123/68 mmHg  Pulse 69  Temp(Src) 97.7 F (36.5 C)  Ht 5\' 2"  (1.575 m)  Wt 109 lb (49.442 kg)  BMI 19.93 kg/m2     Objective:   Physical Exam  Constitutional: She is oriented to person, place, and time. She appears  well-developed and well-nourished.  HENT:  Head: Normocephalic and atraumatic.  Eyes: Conjunctivae and EOM are normal. Pupils are equal, round, and reactive to light.  Neck: Normal range of motion. Neck supple.  Cardiovascular: Normal rate, regular rhythm and intact distal pulses.   Pulmonary/Chest: Effort normal.  Abdominal: Soft.  Musculoskeletal: She exhibits tenderness (Left shoulder wtih tenderness, ROM forward 145, abduction 100, internal 30, external 25, extension 10, adduction 40.  NV intact.  Left shoulder negative.  Neck negative.).  Neurological: She is alert and oriented to person, place, and time. She displays normal reflexes. No cranial nerve deficit. She exhibits normal muscle tone. Coordination normal.  Skin: Skin is warm and dry.  Psychiatric: She has a normal mood and affect. Her behavior is normal. Judgment and thought content normal.  Back Exam   Tenderness  The patient is experiencing tenderness in the lumbar.  Range of Motion  Extension: 10  Flexion: 30  Lateral Bend Right: normal  Lateral Bend Left: normal  Rotation Right: normal  Rotation Left: normal   Muscle Strength  The patient has normal back strength.  Reflexes  Patellar: normal Achilles: normal Biceps: normal  Other  Toe Walk: normal Heel Walk: normal Sensation: normal Gait: normal  Erythema: no back redness Scars: absent     PROCEDURE NOTE:  The patient request injection, verbal consent was obtained.  The left shoulder was prepped appropriately after time out was performed.   Sterile technique was observed and injection of 1 cc of Depo-Medrol 40 mg with several cc's of plain xylocaine. Anesthesia was provided by ethyl chloride and a 20-gauge needle was used to inject the shoulder area. A posterior approach was used.  The injection was tolerated well.  A band aid dressing was applied.  The patient was advised to apply ice later today and tomorrow to the injection sight as  needed.       Assessment & Plan:   Encounter Diagnosis  Name Primary?  . Left shoulder pain Yes   I have given medicine for pain and Naprosyn..  Precautions discussed.  Return in two weeks.  Call if any problem.  Electronically Signed Sanjuana Kava, MD 7/19/201712:59 PM

## 2016-08-01 ENCOUNTER — Encounter: Payer: Self-pay | Admitting: Orthopaedic Surgery

## 2016-08-01 ENCOUNTER — Ambulatory Visit (INDEPENDENT_AMBULATORY_CARE_PROVIDER_SITE_OTHER): Payer: PPO | Admitting: Orthopaedic Surgery

## 2016-08-01 VITALS — BP 117/63 | HR 60 | Temp 97.7°F | Ht 61.0 in | Wt 108.0 lb

## 2016-08-01 DIAGNOSIS — M25512 Pain in left shoulder: Secondary | ICD-10-CM | POA: Diagnosis not present

## 2016-08-01 NOTE — Patient Instructions (Signed)
Shoulder Range of Motion Exercises Shoulder range of motion (ROM) exercises are designed to keep the shoulder moving freely. They are often recommended for people who have shoulder pain. MOVEMENT EXERCISE When you are able, do this exercise 5-6 days per week, or as told by your health care provider. Work toward doing 2 sets of 10 swings. Pendulum Exercise How To Do This Exercise Lying Down 1. Lie face-down on a bed with your abdomen close to the side of the bed. 2. Let your arm hang over the side of the bed. 3. Relax your shoulder, arm, and hand. 4. Slowly and gently swing your arm forward and back. Do not use your neck muscles to swing your arm. They should be relaxed. If you are struggling to swing your arm, have someone gently swing it for you. When you do this exercise for the first time, swing your arm at a 15 degree angle for 15 seconds, or swing your arm 10 times. As pain lessens over time, increase the angle of the swing to 30-45 degrees. 5. Repeat steps 1-4 with the other arm. How To Do This Exercise While Standing 1. Stand next to a sturdy chair or table and hold on to it with your hand.  Bend forward at the waist.  Bend your knees slightly.  Relax your other arm and let it hang limp.  Relax the shoulder blade of the arm that is hanging and let it drop.  While keeping your shoulder relaxed, use body motion to swing your arm in small circles. The first time you do this exercise, swing your arm for about 30 seconds or 10 times. When you do it next time, swing your arm for a little longer.  Stand up tall and relax.  Repeat steps 1-7, this time changing the direction of the circles. 2. Repeat steps 1-8 with the other arm. STRETCHING EXERCISES Do these exercises 3-4 times per day on 5-6 days per week or as told by your health care provider. Work toward holding the stretch for 20 seconds. Stretching Exercise 1 1. Lift your arm straight out in front of you. 2. Bend your arm 90  degrees at the elbow (right angle) so your forearm goes across your body and looks like the letter "L." 3. Use your other arm to gently pull the elbow forward and across your body. 4. Repeat steps 1-3 with the other arm. Stretching Exercise 2 You will need a towel or rope for this exercise. 1. Bend one arm behind your back with the palm facing outward. 2. Hold a towel with your other hand. 3. Reach the arm that holds the towel above your head, and bend that arm at the elbow. Your wrist should be behind your neck. 4. Use your free hand to grab the free end of the towel. 5. With the higher hand, gently pull the towel up behind you. 6. With the lower hand, pull the towel down behind you. 7. Repeat steps 1-6 with the other arm. STRENGTHENING EXERCISES Do each of these exercises at four different times of day (sessions) every day or as told by your health care provider. To begin with, repeat each exercise 5 times (repetitions). Work toward doing 3 sets of 12 repetitions or as told by your health care provider. Strengthening Exercise 1 You will need a light weight for this activity. As you grow stronger, you may use a heavier weight. 1. Standing with a weight in your hand, lift your arm straight out to the side   until it is at the same height as your shoulder. 2. Bend your arm at 90 degrees so that your fingers are pointing to the ceiling. 3. Slowly raise your hand until your arm is straight up in the air. 4. Repeat steps 1-3 with the other arm. Strengthening Exercise 2 You will need a light weight for this activity. As you grow stronger, you may use a heavier weight. 1. Standing with a weight in your hand, gradually move your straight arm in an arc, starting at your side, then out in front of you, then straight up over your head. 2. Gradually move your other arm in an arc, starting at your side, then out in front of you, then straight up over your head. 3. Repeat steps 1-2 with the other  arm. Strengthening Exercise 3 You will need an elastic band for this activity. As you grow stronger, gradually increase the size of the bands or increase the number of bands that you use at one time. 1. While standing, hold an elastic band in one hand and raise that arm up in the air. 2. With your other hand, pull down the band until that hand is by your side. 3. Repeat steps 1-2 with the other arm.   This information is not intended to replace advice given to you by your health care provider. Make sure you discuss any questions you have with your health care provider.   Document Released: 09/15/2003 Document Revised: 05/03/2015 Document Reviewed: 12/13/2014 Elsevier Interactive Patient Education 2016 [employer]  

## 2016-08-01 NOTE — Progress Notes (Signed)
Patient [id number] Megan Gibbs, female DOB:02/15/1951, 65 y.o. [id number]  Chief Complaint  Patient presents with  . Follow-up    Left shoulder pain    HPI  Megan Gibbs is a 65 y.o. female who has left shoulder pain chronically.  She has been doing her exercises and is improved after the injection last time.  She has still lack of full motion but her pain is much less.  She has no paresthesias.  I went over exercises again with her. HPI  Body mass index is 20.41 kg/m.  ROS  Review of Systems  HENT: Negative for congestion.   Respiratory: Negative for cough and shortness of breath.   Cardiovascular: Negative for chest pain and leg swelling.  Endocrine: Positive for cold intolerance.  Musculoskeletal: Positive for arthralgias and back pain.  Allergic/Immunologic: Positive for environmental allergies.  Psychiatric/Behavioral: The patient is nervous/anxious.     Past Medical History:  Diagnosis Date  . Allergic rhinitis   . Anemia   . Anxiety   . Back pain   . Depression   . Hypokalemia   . Osteoporosis   . Prediabetes   . Seasonal allergies   . Vertigo   . Wears glasses     Past Surgical History:  Procedure Laterality Date  . BACK SURGERY  1992 & 2009   Dr. Joya Salm   . BREAST LUMPECTOMY WITH RADIOACTIVE SEED LOCALIZATION Right 12/01/2014   Procedure: RIGHT BREAST LUMPECTOMY WITH RADIOACTIVE SEED LOCALIZATION;  Surgeon: Fanny Skates, MD;  Location: [place];  Service: General;  Laterality: Right;  . COLONOSCOPY N/A 04/19/2014   Procedure: COLONOSCOPY;  Surgeon: Daneil Dolin, MD;  [facility];  Service: Endoscopy;  Laterality: N/A;  9:30 AM  . DILATION AND CURETTAGE OF UTERUS    . TUBAL LIGATION     1976    Family History  Problem Relation Age of Onset  . Diabetes Mother   . Hypertension Mother   . Asthma Sister   . Thyroid disease Sister   . Diabetes Sister   . Hypertension Sister   . Schizophrenia Father   . Drug abuse Brother   .  Alcohol abuse Brother   . Sleep apnea Grandchild   . ADD / ADHD Grandchild   . Seizures Grandchild   . Anxiety disorder Maternal Aunt   . Colon cancer Neg Hx     Social History Social History  Substance Use Topics  . Smoking status: Never Smoker  . Smokeless tobacco: Never Used  . Alcohol use No    No Known Allergies  Current Outpatient Prescriptions  Medication Sig Dispense Refill  . alendronate (FOSAMAX) 70 MG tablet Take 1 tablet (70 mg total) by mouth once a week. Take with a full glass of water on an empty stomach. 12 tablet 4  . aspirin (ASPIRIN LOW DOSE) 81 MG EC tablet Take 81 mg by mouth daily.      . calcium-vitamin D (OSCAL WITH D) 500-200 MG-UNIT per tablet Take 1 tablet by mouth daily with breakfast.    . cetirizine (ZYRTEC) 10 MG chewable tablet Chew 10 mg by mouth daily.    . ferrous sulfate 325 (65 FE) MG tablet Take 325 mg by mouth daily with breakfast. Reported on 04/24/2016    . HYDROcodone-acetaminophen (NORCO/VICODIN) 5-325 MG tablet Take 1 tablet by mouth every 4 (four) hours as needed for moderate pain (Must last 15 days.Do not take and drive a car or use machinery.). 60 tablet 0  .  Multiple Vitamins-Minerals (CENTRUM SILVER PO) Take by mouth.    . naproxen (NAPROSYN) 500 MG tablet Take 1 tablet (500 mg total) by mouth 2 (two) times daily with a meal. 60 tablet 5  . traZODone (DESYREL) 100 MG tablet Take 100 mg by mouth at bedtime.    . Vitamin D, Ergocalciferol, (DRISDOL) 50000 UNITS CAPS capsule Take 1 capsule (50,000 Units total) by mouth every 28 (twenty-eight) days. 4 capsule 5   No current facility-administered medications for this visit.      Physical Exam  Blood pressure 117/63, pulse 60, temperature 97.7 F (36.5 C), height 5\' 1"  (1.549 m), weight 108 lb (49 kg).  Constitutional: overall normal hygiene, normal nutrition, well developed, normal grooming, normal body habitus. Assistive device:none  Musculoskeletal: gait and station Limp none,  muscle tone and strength are normal, no tremors or atrophy is present.  .  Neurological: coordination overall normal.  Deep tendon reflex/nerve stretch intact.  Sensation normal.  Cranial nerves II-XII intact.   Skin:   normal overall no scars, lesions, ulcers or rashes. No psoriasis.  Psychiatric: Alert and oriented x 3.  Recent memory intact, remote memory unclear.  Normal mood and affect. Well groomed.  Good eye contact.  Cardiovascular: overall no swelling, no varicosities, no edema bilaterally, normal temperatures of the legs and arms, no clubbing, cyanosis and good capillary refill.  Lymphatic: palpation is normal.  Examination of left Upper Extremity is done.  Inspection:   Overall:  Elbow non-tender without crepitus or defects, forearm non-tender without crepitus or defects, wrist non-tender without crepitus or defects, hand non-tender.    Shoulder: with glenohumeral joint tenderness, without effusion.   Upper arm: without swelling and tenderness   Range of motion:   Overall:  Full range of motion of the elbow, full range of motion of wrist and full range of motion in fingers.   Shoulder:  left  165 degrees forward flexion; 145 degrees abduction; 35 degrees internal rotation, 35 degrees external rotation, 15 degrees extension, 40 degrees adduction.   Stability:   Overall:  Shoulder, elbow and wrist stable   Strength and Tone:   Overall full shoulder muscles strength, full upper arm strength and normal upper arm bulk and tone.   The patient has been educated about the nature of the problem(s) and counseled on treatment options.  The patient appeared to understand what I have discussed and is in agreement with it.  Encounter Diagnosis  Name Primary?  . Left shoulder pain Yes    PLAN Call if any problems.  Precautions discussed.  Continue current medications.   Return to clinic 1 month   Continue exercises.  Exercise sheet printed.  Electronically Signed Sanjuana Kava, MD 8/2/20178:50 AM

## 2016-08-14 DIAGNOSIS — D509 Iron deficiency anemia, unspecified: Secondary | ICD-10-CM | POA: Diagnosis not present

## 2016-08-14 LAB — CBC
HCT: 37.4 % (ref 35.0–45.0)
Hemoglobin: 12 g/dL (ref 11.7–15.5)
MCH: 26.8 pg — AB (ref 27.0–33.0)
MCHC: 32.1 g/dL (ref 32.0–36.0)
MCV: 83.7 fL (ref 80.0–100.0)
MPV: 10.8 fL (ref 7.5–12.5)
PLATELETS: 312 10*3/uL (ref 140–400)
RBC: 4.47 MIL/uL (ref 3.80–5.10)
RDW: 13.9 % (ref 11.0–15.0)
WBC: 6.5 10*3/uL (ref 3.8–10.8)

## 2016-08-15 LAB — FERRITIN: Ferritin: 387 ng/mL — ABNORMAL HIGH (ref 20–288)

## 2016-08-15 LAB — IRON: IRON: 97 ug/dL (ref 45–160)

## 2016-08-20 ENCOUNTER — Encounter: Payer: Self-pay | Admitting: Family Medicine

## 2016-08-20 ENCOUNTER — Ambulatory Visit (INDEPENDENT_AMBULATORY_CARE_PROVIDER_SITE_OTHER): Payer: PPO | Admitting: Family Medicine

## 2016-08-20 VITALS — BP 118/78 | HR 76 | Resp 16 | Ht 61.0 in | Wt 108.0 lb

## 2016-08-20 DIAGNOSIS — Z0000 Encounter for general adult medical examination without abnormal findings: Secondary | ICD-10-CM | POA: Diagnosis not present

## 2016-08-20 DIAGNOSIS — Z23 Encounter for immunization: Secondary | ICD-10-CM | POA: Diagnosis not present

## 2016-08-20 DIAGNOSIS — R7303 Prediabetes: Secondary | ICD-10-CM

## 2016-08-20 DIAGNOSIS — Z1231 Encounter for screening mammogram for malignant neoplasm of breast: Secondary | ICD-10-CM

## 2016-08-20 DIAGNOSIS — Z1211 Encounter for screening for malignant neoplasm of colon: Secondary | ICD-10-CM

## 2016-08-20 DIAGNOSIS — D539 Nutritional anemia, unspecified: Secondary | ICD-10-CM

## 2016-08-20 LAB — POC HEMOCCULT BLD/STL (OFFICE/1-CARD/DIAGNOSTIC): FECAL OCCULT BLD: NEGATIVE

## 2016-08-20 NOTE — Patient Instructions (Signed)
F/u in 4 month, call if you need me sooner  Flu vaccine today  The multivitamin tabs you have , take one twice weekly till done then no more  OK to have two bOOST cans daily  Resume medication prescribed for sleep by psychiatry.  Since shoulder not hurting , stop pain med for this  Thank you  for choosing [facility]. We consider it a privelige to serve you.  Delivering excellent health care in a caring and  compassionate way is our goal.  Partnering with you,  so that together we can achieve this goal is our strategy.    Fasting labs 1 week nefore next visit

## 2016-08-20 NOTE — Addendum Note (Signed)
Addended by: Eual Fines on: 08/20/2016 11:43 AM   Modules accepted: Orders

## 2016-08-20 NOTE — Progress Notes (Signed)
    Megan Gibbs     MRN: [id number]      DOB: [birth year]  HPI: Patient is in for annual physical exam. No other health concerns are expressed or addressed at the visit. Recent labs, if available are reviewed. Immunization is reviewed , and  updated if needed.   PE: Pleasant  female, alert and oriented x 3, in no cardio-pulmonary distress. Afebrile. HEENT No facial trauma or asymetry. Sinuses non tender.  Extra occullar muscles intact, pupils equally reactive to light. External ears normal, tympanic membranes clear. Oropharynx moist, no exudate. Neck: supple, no adenopathy,JVD or thyromegaly.No bruits.  Chest: Clear to ascultation bilaterally.No crackles or wheezes. Non tender to palpation  Breast: No asymetry,no masses or lumps. No tenderness. No nipple discharge or inversion. No axillary or supraclavicular adenopathy  Cardiovascular system; Heart sounds normal,  S1 and  S2 ,no S3.  No murmur, or thrill. Apical beat not displaced Peripheral pulses normal.  Abdomen: Soft, non tender, no organomegaly or masses. No bruits. Bowel sounds normal. No guarding, tenderness or rebound.  Rectal:  Normal sphincter tone. No rectal mass. Guaiac negative stool.  GU: External genitalia normal female genitalia , normal female distribution of hair. No lesions. Urethral meatus normal in size, no  Prolapse, no lesions visibly  Present. Bladder non tender. Vagina pink and moist , with no visible lesions , discharge present . Adequate pelvic support no  cystocele or rectocele noted Cervix pink and appears healthy, no lesions or ulcerations noted, no discharge noted from os Uterus normal size, no adnexal masses, no cervical motion or adnexal tenderness.   Musculoskeletal exam: Full ROM of spine, hips , shoulders and knees. No deformity ,swelling or crepitus noted. No muscle wasting or atrophy.   Neurologic: Cranial nerves 2 to 12 intact. Power, tone ,sensation and reflexes normal  throughout. No disturbance in gait. No tremor.  Skin: Intact, no ulceration, erythema , scaling or rash noted. Pigmentation normal throughout  Psych; Normal mood and affect. Judgement and concentration normal   Assessment & Plan:  Annual physical exam Annual exam as documented. Counseling done  re healthy lifestyle involving commitment to 150 minutes exercise per week, heart healthy diet, and attaining healthy weight.The importance of adequate sleep also discussed. Regular seat belt use and home safety, is also discussed. Changes in health habits are decided on by the patient with goals and time frames  set for achieving them. Immunization and cancer screening needs are specifically addressed at this visit.   Need for prophylactic vaccination and inoculation against influenza After obtaining informed consent, the vaccine is  administered by LPN.

## 2016-08-20 NOTE — Assessment & Plan Note (Signed)
After obtaining informed consent, the vaccine is  administered by LPN.  

## 2016-08-20 NOTE — Assessment & Plan Note (Signed)

## 2016-08-29 ENCOUNTER — Ambulatory Visit (INDEPENDENT_AMBULATORY_CARE_PROVIDER_SITE_OTHER): Payer: PPO | Admitting: Orthopaedic Surgery

## 2016-08-29 ENCOUNTER — Encounter: Payer: Self-pay | Admitting: Orthopaedic Surgery

## 2016-08-29 VITALS — BP 123/67 | HR 70 | Temp 97.5°F | Ht 62.0 in | Wt 109.0 lb

## 2016-08-29 DIAGNOSIS — M25512 Pain in left shoulder: Secondary | ICD-10-CM

## 2016-08-29 NOTE — Progress Notes (Signed)
Patient [id number] Megan Gibbs, female DOB:April 16, 1951, 65 y.o. [id number]  Chief Complaint  Patient presents with  . Follow-up    left shoulder pain    HPI  Megan Gibbs is a 65 y.o. female who has chronic left shoulder pain.  She has no new trauma, no paresthesias.  She is doing her exercises and taking her Naprosyn.  She has some pain on rainy days. HPI  Body mass index is 19.94 kg/m.  ROS  Review of Systems  HENT: Negative for congestion.   Respiratory: Negative for cough and shortness of breath.   Cardiovascular: Negative for chest pain and leg swelling.  Endocrine: Positive for cold intolerance.  Musculoskeletal: Positive for arthralgias and back pain.  Allergic/Immunologic: Positive for environmental allergies.  Psychiatric/Behavioral: The patient is nervous/anxious.     Past Medical History:  Diagnosis Date  . Allergic rhinitis   . Anemia   . Anxiety   . Back pain   . Depression   . Hypokalemia   . Osteoporosis   . Prediabetes   . Seasonal allergies   . Vertigo   . Wears glasses     Past Surgical History:  Procedure Laterality Date  . BACK SURGERY  1992 & 2009   Dr. Joya Salm   . BREAST LUMPECTOMY WITH RADIOACTIVE SEED LOCALIZATION Right 12/01/2014   Procedure: RIGHT BREAST LUMPECTOMY WITH RADIOACTIVE SEED LOCALIZATION;  Surgeon: Fanny Skates, MD;  Location: [place];  Service: General;  Laterality: Right;  . COLONOSCOPY N/A 04/19/2014   Procedure: COLONOSCOPY;  Surgeon: Daneil Dolin, MD;  [facility];  Service: Endoscopy;  Laterality: N/A;  9:30 AM  . DILATION AND CURETTAGE OF UTERUS    . TUBAL LIGATION     1976    Family History  Problem Relation Age of Onset  . Diabetes Mother   . Hypertension Mother   . Asthma Sister   . Thyroid disease Sister   . Diabetes Sister   . Hypertension Sister   . Schizophrenia Father   . Drug abuse Brother   . Alcohol abuse Brother   . Sleep apnea Grandchild   . ADD / ADHD Grandchild   .  Seizures Grandchild   . Anxiety disorder Maternal Aunt   . Colon cancer Neg Hx     Social History Social History  Substance Use Topics  . Smoking status: Never Smoker  . Smokeless tobacco: Never Used  . Alcohol use No    No Known Allergies  Current Outpatient Prescriptions  Medication Sig Dispense Refill  . alendronate (FOSAMAX) 70 MG tablet Take 1 tablet (70 mg total) by mouth once a week. Take with a full glass of water on an empty stomach. 12 tablet 4  . aspirin (ASPIRIN LOW DOSE) 81 MG EC tablet Take 81 mg by mouth daily.      . calcium-vitamin D (OSCAL WITH D) 500-200 MG-UNIT per tablet Take 1 tablet by mouth daily with breakfast.    . cetirizine (ZYRTEC) 10 MG chewable tablet Chew 10 mg by mouth daily.    . ferrous sulfate 325 (65 FE) MG tablet Take 325 mg by mouth daily with breakfast. Reported on 04/24/2016    . HYDROcodone-acetaminophen (NORCO/VICODIN) 5-325 MG tablet Take 1 tablet by mouth every 4 (four) hours as needed for moderate pain (Must last 15 days.Do not take and drive a car or use machinery.). 60 tablet 0  . naproxen (NAPROSYN) 500 MG tablet Take 1 tablet (500 mg total) by mouth 2 (two)  times daily with a meal. 60 tablet 5  . traZODone (DESYREL) 100 MG tablet Take 100 mg by mouth at bedtime.    . Vitamin D, Ergocalciferol, (DRISDOL) 50000 UNITS CAPS capsule Take 1 capsule (50,000 Units total) by mouth every 28 (twenty-eight) days. 4 capsule 5   No current facility-administered medications for this visit.      Physical Exam  Blood pressure 123/67, pulse 70, temperature 97.5 F (36.4 C), height 5\' 2"  (1.575 m), weight 109 lb (49.4 kg).  Constitutional: overall normal hygiene, normal nutrition, well developed, normal grooming, normal body habitus. Assistive device:none  Musculoskeletal: gait and station Limp none, muscle tone and strength are normal, no tremors or atrophy is present.  .  Neurological: coordination overall normal.  Deep tendon reflex/nerve  stretch intact.  Sensation normal.  Cranial nerves II-XII intact.   Skin:   normal overall no scars, lesions, ulcers or rashes. No psoriasis.  Psychiatric: Alert and oriented x 3.  Recent memory intact, remote memory unclear.  Normal mood and affect. Well groomed.  Good eye contact.  Cardiovascular: overall no swelling, no varicosities, no edema bilaterally, normal temperatures of the legs and arms, no clubbing, cyanosis and good capillary refill.  Lymphatic: palpation is normal.  Examination of left Upper Extremity is done.  Inspection:   Overall:  Elbow non-tender without crepitus or defects, forearm non-tender without crepitus or defects, wrist non-tender without crepitus or defects, hand non-tender.    Shoulder: with glenohumeral joint tenderness, without effusion.   Upper arm: without swelling and tenderness   Range of motion:   Overall:  Full range of motion of the elbow, full range of motion of wrist and full range of motion in fingers.   Shoulder:  left  165 degrees forward flexion; 140 degrees abduction; 35 degrees internal rotation, 35 degrees external rotation, 15 degrees extension, 40 degrees adduction.   Stability:   Overall:  Shoulder, elbow and wrist stable   Strength and Tone:   Overall full shoulder muscles strength, full upper arm strength and normal upper arm bulk and tone.   The patient has been educated about the nature of the problem(s) and counseled on treatment options.  The patient appeared to understand what I have discussed and is in agreement with it.  Encounter Diagnosis  Name Primary?  . Left shoulder pain Yes    PLAN Call if any problems.  Precautions discussed.  Continue current medications.   Return to clinic 6 weeks   Electronically Signed Sanjuana Kava, MD 8/30/20179:38 AM

## 2016-09-04 DIAGNOSIS — F419 Anxiety disorder, unspecified: Secondary | ICD-10-CM | POA: Diagnosis not present

## 2016-09-18 ENCOUNTER — Encounter: Payer: Self-pay | Admitting: Family Medicine

## 2016-10-10 ENCOUNTER — Ambulatory Visit (INDEPENDENT_AMBULATORY_CARE_PROVIDER_SITE_OTHER): Payer: PPO | Admitting: Orthopaedic Surgery

## 2016-10-10 ENCOUNTER — Encounter: Payer: Self-pay | Admitting: Orthopaedic Surgery

## 2016-10-10 VITALS — BP 108/66 | HR 65 | Temp 97.7°F | Ht 62.0 in | Wt 111.0 lb

## 2016-10-10 DIAGNOSIS — G8929 Other chronic pain: Secondary | ICD-10-CM

## 2016-10-10 DIAGNOSIS — M25512 Pain in left shoulder: Secondary | ICD-10-CM | POA: Diagnosis not present

## 2016-10-10 NOTE — Progress Notes (Signed)
Patient [id number] Megan Gibbs, female DOB:1951/12/30, 65 y.o. [id number]  Chief Complaint  Patient presents with  . Follow-up    Left shoulder    HPI  Megan Gibbs is a 65 y.o. female who has chronic pain of the left shoulder.  With the cooler mornings she has had more pain in the mornings. She has noticed a little more tightness of the left shoulder.  She has no paresthesias, no trauma. She is doing her exercises. HPI  Body mass index is 20.3 kg/m.  ROS  Review of Systems  HENT: Negative for congestion.   Respiratory: Negative for cough and shortness of breath.   Cardiovascular: Negative for chest pain and leg swelling.  Endocrine: Positive for cold intolerance.  Musculoskeletal: Positive for arthralgias and back pain.  Allergic/Immunologic: Positive for environmental allergies.  Psychiatric/Behavioral: The patient is nervous/anxious.     Past Medical History:  Diagnosis Date  . Allergic rhinitis   . Anemia   . Anxiety   . Back pain   . Depression   . Hypokalemia   . Osteoporosis   . Prediabetes   . Seasonal allergies   . Vertigo   . Wears glasses     Past Surgical History:  Procedure Laterality Date  . BACK SURGERY  1992 & 2009   Dr. Joya Salm   . BREAST LUMPECTOMY WITH RADIOACTIVE SEED LOCALIZATION Right 12/01/2014   Procedure: RIGHT BREAST LUMPECTOMY WITH RADIOACTIVE SEED LOCALIZATION;  Surgeon: Fanny Skates, MD;  Location: [place];  Service: General;  Laterality: Right;  . COLONOSCOPY N/A 04/19/2014   Procedure: COLONOSCOPY;  Surgeon: Daneil Dolin, MD;  [facility];  Service: Endoscopy;  Laterality: N/A;  9:30 AM  . DILATION AND CURETTAGE OF UTERUS    . TUBAL LIGATION     1976    Family History  Problem Relation Age of Onset  . Diabetes Mother   . Hypertension Mother   . Asthma Sister   . Thyroid disease Sister   . Diabetes Sister   . Hypertension Sister   . Schizophrenia Father   . Drug abuse Brother   . Alcohol abuse  Brother   . Sleep apnea Grandchild   . ADD / ADHD Grandchild   . Seizures Grandchild   . Anxiety disorder Maternal Aunt   . Colon cancer Neg Hx     Social History Social History  Substance Use Topics  . Smoking status: Never Smoker  . Smokeless tobacco: Never Used  . Alcohol use No    No Known Allergies  Current Outpatient Prescriptions  Medication Sig Dispense Refill  . alendronate (FOSAMAX) 70 MG tablet Take 1 tablet (70 mg total) by mouth once a week. Take with a full glass of water on an empty stomach. 12 tablet 4  . aspirin (ASPIRIN LOW DOSE) 81 MG EC tablet Take 81 mg by mouth daily.      . calcium-vitamin D (OSCAL WITH D) 500-200 MG-UNIT per tablet Take 1 tablet by mouth daily with breakfast.    . cetirizine (ZYRTEC) 10 MG chewable tablet Chew 10 mg by mouth daily.    . ferrous sulfate 325 (65 FE) MG tablet Take 325 mg by mouth daily with breakfast. Reported on 04/24/2016    . HYDROcodone-acetaminophen (NORCO/VICODIN) 5-325 MG tablet Take 1 tablet by mouth every 4 (four) hours as needed for moderate pain (Must last 15 days.Do not take and drive a car or use machinery.). 60 tablet 0  . naproxen (NAPROSYN) 500 MG  tablet Take 1 tablet (500 mg total) by mouth 2 (two) times daily with a meal. 60 tablet 5  . traZODone (DESYREL) 100 MG tablet Take 100 mg by mouth at bedtime.    . Vitamin D, Ergocalciferol, (DRISDOL) 50000 UNITS CAPS capsule Take 1 capsule (50,000 Units total) by mouth every 28 (twenty-eight) days. 4 capsule 5   No current facility-administered medications for this visit.      Physical Exam  Blood pressure 108/66, pulse 65, temperature 97.7 F (36.5 C), height 5\' 2"  (1.575 m), weight 111 lb (50.3 kg).  Constitutional: overall normal hygiene, normal nutrition, well developed, normal grooming, normal body habitus. Assistive device:none  Musculoskeletal: gait and station Limp none, muscle tone and strength are normal, no tremors or atrophy is present.  .   Neurological: coordination overall normal.  Deep tendon reflex/nerve stretch intact.  Sensation normal.  Cranial nerves II-XII intact.   Skin:   Normal overall no scars, lesions, ulcers or rashes. No psoriasis.  Psychiatric: Alert and oriented x 3.  Recent memory intact, remote memory unclear.  Normal mood and affect. Well groomed.  Good eye contact.  Cardiovascular: overall no swelling, no varicosities, no edema bilaterally, normal temperatures of the legs and arms, no clubbing, cyanosis and good capillary refill.  Lymphatic: palpation is normal.  Examination of left Upper Extremity is done.  Inspection:   Overall:  Elbow non-tender without crepitus or defects, forearm non-tender without crepitus or defects, wrist non-tender without crepitus or defects, hand non-tender.    Shoulder: with glenohumeral joint tenderness, without effusion.   Upper arm: without swelling and tenderness   Range of motion:   Overall:  Full range of motion of the elbow, full range of motion of wrist and full range of motion in fingers.   Shoulder:  left  180 degrees forward flexion; 165 degrees abduction; 35 degrees internal rotation, 35 degrees external rotation, 20 degrees extension, 40 degrees adduction.   Stability:   Overall:  Shoulder, elbow and wrist stable   Strength and Tone:   Overall full shoulder muscles strength, full upper arm strength and normal upper arm bulk and tone.   The patient has been educated about the nature of the problem(s) and counseled on treatment options.  The patient appeared to understand what I have discussed and is in agreement with it.  Encounter Diagnosis  Name Primary?  . Chronic left shoulder pain Yes    PLAN Call if any problems.  Precautions discussed.  Continue current medications.   Return to clinic 6 weeks   Electronically Signed Sanjuana Kava, MD 10/11/20179:38 AM

## 2016-10-16 ENCOUNTER — Other Ambulatory Visit: Payer: Self-pay | Admitting: Family Medicine

## 2016-10-16 DIAGNOSIS — Z1231 Encounter for screening mammogram for malignant neoplasm of breast: Secondary | ICD-10-CM

## 2016-10-18 ENCOUNTER — Ambulatory Visit (INDEPENDENT_AMBULATORY_CARE_PROVIDER_SITE_OTHER): Payer: PPO

## 2016-10-18 DIAGNOSIS — N3000 Acute cystitis without hematuria: Secondary | ICD-10-CM

## 2016-10-18 LAB — POCT URINALYSIS DIPSTICK
BILIRUBIN UA: NEGATIVE
Blood, UA: NEGATIVE
Glucose, UA: NEGATIVE
KETONES UA: NEGATIVE
Nitrite, UA: POSITIVE
PH UA: 5.5
Protein, UA: NEGATIVE
SPEC GRAV UA: 1.02
Urobilinogen, UA: 0.2

## 2016-10-18 MED ORDER — CIPROFLOXACIN HCL 500 MG PO TABS: 500.0000 mg | ORAL_TABLET | Freq: Two times a day (BID) | ORAL | 0 refills | Status: DC

## 2016-10-18 NOTE — Progress Notes (Signed)
Patient in for nurse visit for UTI symptoms.  Dipstick urine collected.  Urine sent for culture.  Standing order for Cipro 500 mg BID sent to pharmacy.

## 2016-10-19 ENCOUNTER — Other Ambulatory Visit (HOSPITAL_COMMUNITY)
Admission: RE | Admit: 2016-10-19 | Discharge: 2016-10-19 | Disposition: A | Discharge status: Home / Self Care | Payer: PPO | Source: Other Acute Inpatient Hospital | Attending: Family Medicine | Admitting: Family Medicine

## 2016-10-19 DIAGNOSIS — N3000 Acute cystitis without hematuria: Secondary | ICD-10-CM | POA: Diagnosis not present

## 2016-10-22 LAB — URINE CULTURE

## 2016-10-29 ENCOUNTER — Ambulatory Visit (HOSPITAL_COMMUNITY)
Admission: RE | Admit: 2016-10-29 | Discharge: 2016-10-29 | Disposition: A | Discharge status: Home / Self Care | Payer: PPO | Source: Ambulatory Visit | Attending: Family Medicine | Admitting: Family Medicine

## 2016-10-29 DIAGNOSIS — Z87898 Personal history of other specified conditions: Secondary | ICD-10-CM | POA: Insufficient documentation

## 2016-10-29 DIAGNOSIS — Z1231 Encounter for screening mammogram for malignant neoplasm of breast: Secondary | ICD-10-CM | POA: Insufficient documentation

## 2016-11-09 ENCOUNTER — Other Ambulatory Visit: Payer: Self-pay | Admitting: Family Medicine

## 2016-11-21 ENCOUNTER — Ambulatory Visit (INDEPENDENT_AMBULATORY_CARE_PROVIDER_SITE_OTHER): Payer: PPO | Admitting: Orthopaedic Surgery

## 2016-11-21 ENCOUNTER — Encounter: Payer: Self-pay | Admitting: Orthopaedic Surgery

## 2016-11-21 VITALS — BP 112/70 | HR 69 | Temp 97.3°F | Ht 62.0 in | Wt 114.0 lb

## 2016-11-21 DIAGNOSIS — G8929 Other chronic pain: Secondary | ICD-10-CM

## 2016-11-21 DIAGNOSIS — M25512 Pain in left shoulder: Secondary | ICD-10-CM | POA: Diagnosis not present

## 2016-11-21 MED ORDER — HYDROCODONE-ACETAMINOPHEN 5-325 MG PO TABS: 1.0000 | ORAL_TABLET | ORAL | 0 refills | Status: DC | PRN

## 2016-11-21 NOTE — Progress Notes (Signed)
Patient [id number] Megan Gibbs, female DOB:[birth year], 65 y.o. [id number]  Chief Complaint  Patient presents with  . Shoulder Pain    left    HPI  Megan Gibbs is a 65 y.o. female who has chronic left shoulder pain.  She is improved as she has really being working hard doing her exercises.  She has no new trauma. She is taking her Naprosyn.  She is pleased with her progress. HPI  Body mass index is 20.85 kg/m.  ROS  Review of Systems  HENT: Negative for congestion.   Respiratory: Negative for cough and shortness of breath.   Cardiovascular: Negative for chest pain and leg swelling.  Endocrine: Positive for cold intolerance.  Musculoskeletal: Positive for arthralgias and back pain.  Allergic/Immunologic: Positive for environmental allergies.  Psychiatric/Behavioral: The patient is nervous/anxious.     Past Medical History:  Diagnosis Date  . Allergic rhinitis   . Anemia   . Anxiety   . Back pain   . Depression   . Hypokalemia   . Osteoporosis   . Prediabetes   . Seasonal allergies   . Vertigo   . Wears glasses     Past Surgical History:  Procedure Laterality Date  . BACK SURGERY  1992 & 2009   Dr. Joya Salm   . BREAST LUMPECTOMY WITH RADIOACTIVE SEED LOCALIZATION Right 12/01/2014   Procedure: RIGHT BREAST LUMPECTOMY WITH RADIOACTIVE SEED LOCALIZATION;  Surgeon: Fanny Skates, MD;  Location: [place];  Service: General;  Laterality: Right;  . COLONOSCOPY N/A 04/19/2014   Procedure: COLONOSCOPY;  Surgeon: Daneil Dolin, MD;  [facility];  Service: Endoscopy;  Laterality: N/A;  9:30 AM  . DILATION AND CURETTAGE OF UTERUS    . TUBAL LIGATION     1976    Family History  Problem Relation Age of Onset  . Diabetes Mother   . Hypertension Mother   . Asthma Sister   . Thyroid disease Sister   . Diabetes Sister   . Hypertension Sister   . Schizophrenia Father   . Drug abuse Brother   . Alcohol abuse Brother   . Sleep apnea Grandchild   . ADD  / ADHD Grandchild   . Seizures Grandchild   . Anxiety disorder Maternal Aunt   . Colon cancer Neg Hx     Social History Social History  Substance Use Topics  . Smoking status: Never Smoker  . Smokeless tobacco: Never Used  . Alcohol use No    No Known Allergies  Current Outpatient Prescriptions  Medication Sig Dispense Refill  . alendronate (FOSAMAX) 70 MG tablet Take 1 tablet (70 mg total) by mouth once a week. Take with a full glass of water on an empty stomach. 12 tablet 4  . aspirin (ASPIRIN LOW DOSE) 81 MG EC tablet Take 81 mg by mouth daily.      . calcium-vitamin D (OSCAL WITH D) 500-200 MG-UNIT per tablet Take 1 tablet by mouth daily with breakfast.    . cetirizine (ZYRTEC) 10 MG chewable tablet Chew 10 mg by mouth daily.    . ciprofloxacin (CIPRO) 500 MG tablet Take 1 tablet (500 mg total) by mouth 2 (two) times daily. 6 tablet 0  . ferrous sulfate 325 (65 FE) MG tablet Take 325 mg by mouth daily with breakfast. Reported on 04/24/2016    . HYDROcodone-acetaminophen (NORCO/VICODIN) 5-325 MG tablet Take 1 tablet by mouth every 4 (four) hours as needed for moderate pain (Must last 15 days.Do not take  and drive a car or use machinery.). 60 tablet 0  . naproxen (NAPROSYN) 500 MG tablet Take 1 tablet (500 mg total) by mouth 2 (two) times daily with a meal. 60 tablet 5  . traZODone (DESYREL) 100 MG tablet Take 100 mg by mouth at bedtime.    . Vitamin D, Ergocalciferol, (DRISDOL) 50000 UNITS CAPS capsule Take 1 capsule (50,000 Units total) by mouth every 28 (twenty-eight) days. 4 capsule 5   No current facility-administered medications for this visit.      Physical Exam  Height 5\' 2"  (1.575 m), weight 114 lb (51.7 kg).  Constitutional: overall normal hygiene, normal nutrition, well developed, normal grooming, normal body habitus. Assistive device:none  Musculoskeletal: gait and station Limp none, muscle tone and strength are normal, no tremors or atrophy is present.  .   Neurological: coordination overall normal.  Deep tendon reflex/nerve stretch intact.  Sensation normal.  Cranial nerves II-XII intact.   Skin:   Normal overall no scars, lesions, ulcers or rashes. No psoriasis.  Psychiatric: Alert and oriented x 3.  Recent memory intact, remote memory unclear.  Normal mood and affect. Well groomed.  Good eye contact.  Cardiovascular: overall no swelling, no varicosities, no edema bilaterally, normal temperatures of the legs and arms, no clubbing, cyanosis and good capillary refill.  Lymphatic: palpation is normal.  Examination of left Upper Extremity is done.  Inspection:   Overall:  Elbow non-tender without crepitus or defects, forearm non-tender without crepitus or defects, wrist non-tender without crepitus or defects, hand non-tender.    Shoulder: with glenohumeral joint tenderness, without effusion.   Upper arm: without swelling and tenderness   Range of motion:   Overall:  Full range of motion of the elbow, full range of motion of wrist and full range of motion in fingers.   Shoulder:  left  180 degrees forward flexion; 180 degrees abduction; 40 degrees internal rotation, 40 degrees external rotation, 20 degrees extension, 40 degrees adduction.   Stability:   Overall:  Shoulder, elbow and wrist stable   Strength and Tone:   Overall full shoulder muscles strength, full upper arm strength and normal upper arm bulk and tone.   The patient has been educated about the nature of the problem(s) and counseled on treatment options.  The patient appeared to understand what I have discussed and is in agreement with it.  Encounter Diagnosis  Name Primary?  . Chronic left shoulder pain Yes    PLAN Call if any problems.  Precautions discussed.  Continue current medications.   Return to clinic 2 months   Electronically Signed Sanjuana Kava, MD 11/22/20178:53 AM

## 2016-11-26 ENCOUNTER — Ambulatory Visit: Payer: Self-pay | Admitting: Family Medicine

## 2016-12-11 DIAGNOSIS — Z8639 Personal history of other endocrine, nutritional and metabolic disease: Secondary | ICD-10-CM | POA: Diagnosis not present

## 2016-12-11 DIAGNOSIS — D539 Nutritional anemia, unspecified: Secondary | ICD-10-CM | POA: Diagnosis not present

## 2016-12-11 DIAGNOSIS — R7303 Prediabetes: Secondary | ICD-10-CM | POA: Diagnosis not present

## 2016-12-11 DIAGNOSIS — E785 Hyperlipidemia, unspecified: Secondary | ICD-10-CM | POA: Diagnosis not present

## 2016-12-11 DIAGNOSIS — E559 Vitamin D deficiency, unspecified: Secondary | ICD-10-CM | POA: Diagnosis not present

## 2016-12-11 DIAGNOSIS — Z0000 Encounter for general adult medical examination without abnormal findings: Secondary | ICD-10-CM | POA: Diagnosis not present

## 2016-12-11 LAB — CBC
HEMATOCRIT: 38.7 % (ref 35.0–45.0)
HEMOGLOBIN: 12.2 g/dL (ref 11.7–15.5)
MCH: 26.3 pg — AB (ref 27.0–33.0)
MCHC: 31.5 g/dL — ABNORMAL LOW (ref 32.0–36.0)
MCV: 83.4 fL (ref 80.0–100.0)
MPV: 10.9 fL (ref 7.5–12.5)
Platelets: 279 10*3/uL (ref 140–400)
RBC: 4.64 MIL/uL (ref 3.80–5.10)
RDW: 14.2 % (ref 11.0–15.0)
WBC: 4.1 10*3/uL (ref 3.8–10.8)

## 2016-12-12 LAB — LIPID PANEL
CHOL/HDL RATIO: 3.3 ratio (ref <5.0)
CHOLESTEROL: 219 mg/dL — AB (ref <200)
HDL: 66 mg/dL (ref >50)
LDL Cholesterol: 136 mg/dL — ABNORMAL HIGH (ref <100)
Triglycerides: 83 mg/dL (ref <150)
VLDL: 17 mg/dL (ref <30)

## 2016-12-12 LAB — FERRITIN: FERRITIN: 328 ng/mL — AB (ref 20–288)

## 2016-12-12 LAB — HEMOGLOBIN A1C
Hgb A1c MFr Bld: 5.5 % (ref <5.7)
Mean Plasma Glucose: 111 mg/dL

## 2016-12-12 LAB — COMPREHENSIVE METABOLIC PANEL
ALT: 17 U/L (ref 6–29)
AST: 23 U/L (ref 10–35)
Albumin: 4.3 g/dL (ref 3.6–5.1)
Alkaline Phosphatase: 75 U/L (ref 33–130)
BILIRUBIN TOTAL: 0.4 mg/dL (ref 0.2–1.2)
BUN: 14 mg/dL (ref 7–25)
CALCIUM: 9.5 mg/dL (ref 8.6–10.4)
CO2: 27 mmol/L (ref 20–31)
Chloride: 107 mmol/L (ref 98–110)
Creat: 0.88 mg/dL (ref 0.50–0.99)
GLUCOSE: 89 mg/dL (ref 65–99)
Potassium: 4.8 mmol/L (ref 3.5–5.3)
SODIUM: 142 mmol/L (ref 135–146)
Total Protein: 6.8 g/dL (ref 6.1–8.1)

## 2016-12-12 LAB — IRON: Iron: 116 ug/dL (ref 45–160)

## 2016-12-12 LAB — VITAMIN D 25 HYDROXY (VIT D DEFICIENCY, FRACTURES): VIT D 25 HYDROXY: 67 ng/mL (ref 30–100)

## 2016-12-12 LAB — TSH: TSH: 3.22 m[IU]/L

## 2016-12-18 ENCOUNTER — Encounter: Payer: Self-pay | Admitting: Family Medicine

## 2016-12-18 ENCOUNTER — Ambulatory Visit (INDEPENDENT_AMBULATORY_CARE_PROVIDER_SITE_OTHER): Payer: PPO | Admitting: Family Medicine

## 2016-12-18 VITALS — BP 118/74 | HR 81 | Resp 16 | Ht 62.0 in | Wt 115.0 lb

## 2016-12-18 DIAGNOSIS — J302 Other seasonal allergic rhinitis: Secondary | ICD-10-CM | POA: Diagnosis not present

## 2016-12-18 DIAGNOSIS — Z23 Encounter for immunization: Secondary | ICD-10-CM

## 2016-12-18 DIAGNOSIS — E559 Vitamin D deficiency, unspecified: Secondary | ICD-10-CM | POA: Diagnosis not present

## 2016-12-18 DIAGNOSIS — M818 Other osteoporosis without current pathological fracture: Secondary | ICD-10-CM | POA: Diagnosis not present

## 2016-12-18 DIAGNOSIS — Z13220 Encounter for screening for lipoid disorders: Secondary | ICD-10-CM | POA: Diagnosis not present

## 2016-12-18 DIAGNOSIS — R7303 Prediabetes: Secondary | ICD-10-CM | POA: Diagnosis not present

## 2016-12-18 MED ORDER — PNEUMOCOCCAL VAC POLYVALENT 25 MCG/0.5ML IJ INJ: 0.5000 mL | INJECTION | Freq: Once | INTRAMUSCULAR | Status: DC

## 2016-12-18 NOTE — Assessment & Plan Note (Signed)
Activity to 150 mins per week, continue calcium with D and fosamax, d/c vit D weekly

## 2016-12-18 NOTE — Progress Notes (Signed)
   Megan Gibbs     MRN: [id number]      DOB: 1951-07-17   HPI Megan Gibbs is here for follow up and re-evaluation of chronic medical conditions, medication management and review of any available recent lab and radiology data.  Preventive health is updated, specifically  Cancer screening and Immunization.   Questions or concerns regarding consultations or procedures which the PT has had in the interim are  addressed. The PT denies any adverse reactions to current medications since the last visit.  There are no new concerns.  There are no specific complaints   ROS Denies recent fever or chills. Denies sinus pressure, nasal congestion, ear pain or sore throat. Denies chest congestion, productive cough or wheezing. Denies chest pains, palpitations and leg swelling Denies abdominal pain, nausea, vomiting,diarrhea or constipation.   Denies dysuria, frequency, hesitancy or incontinence. Denies joint pain, swelling and limitation in mobility. Denies headaches, seizures, numbness, or tingling. Denies depression, anxiety or insomnia. Denies skin break down or rash.   PE  BP 118/74   Pulse 81   Resp 16   Ht 5\' 2"  (1.575 m)   Wt 115 lb (52.2 kg)   SpO2 99%   BMI 21.03 kg/m   Patient alert and oriented and in no cardiopulmonary distress.  HEENT: No facial asymmetry, EOMI,   oropharynx pink and moist.  Neck supple no JVD, no mass.  Chest: Clear to auscultation bilaterally.  CVS: S1, S2 no murmurs, no S3.Regular rate.  ABD: Soft non tender.   Ext: No edema  MS: Adequate ROM spine, shoulders, hips and knees.  Skin: Intact, no ulcerations or rash noted.  Psych: Good eye contact, normal affect. Memory intact not anxious or depressed appearing.  CNS: CN 2-12 intact, power,  normal throughout.no focal deficits noted.   Assessment & Plan  Osteoporosis Activity to 150 mins per week, continue calcium with D and fosamax, d/c vit D weekly  Prediabetes Normalized Patient educated  about the importance of limiting  Carbohydrate intake , the need to commit to daily physical activity for a minimum of 30 minutes , and to commit weight loss. The fact that changes in all these areas will reduce or eliminate all together the development of diabetes is stressed.   Diabetic Labs Latest Ref Rng & Units 12/11/2016 12/12/2015 06/21/2015 03/14/2015 11/30/2014  HbA1c <5.7 % 5.5 5.6 5.6 5.9(H) -  Chol <200 mg/dL 219(H) 184 - 174 -  HDL >50 mg/dL 66 51 - 68 -  Calc LDL <100 mg/dL 136(H) 118 - 89 -  Triglycerides <150 mg/dL 83 75 - 83 -  Creatinine 0.50 - 0.99 mg/dL 0.88 0.87 - - 0.71   BP/Weight 12/18/2016 11/21/2016 10/10/2016 08/29/2016 08/20/2016 08/01/2016 [id number]  Systolic BP 123456 [id number] 123456 [id number]  Diastolic BP 74 70 66 67 78 63 68  Wt. (Lbs) 115 114 111 109 108 108 109  BMI 21.03 20.85 20.3 19.94 20.41 20.41 19.93  Some encounter information is confidential and restricted. Go to Review Flowsheets activity to see all data.   No flowsheet data found.    Allergic rhinitis Controlled, no change in medication   Need for 23-polyvalent pneumococcal polysaccharide vaccine After obtaining informed consent, the vaccine is  administered by LPN.

## 2016-12-18 NOTE — Assessment & Plan Note (Signed)
Controlled, no change in medication  

## 2016-12-18 NOTE — Patient Instructions (Addendum)
Annual wellness in May when due, call if you need me sooner  After current vit D  Capsules finish, discontinue weekly vitamin D  Excellent labs,  Labs for next visit will be in the computer you will not need an order just go to the lab and let them know labs for Dr. Moshe Cipro  Pneumonia vaccine today   Thank you  for choosing [facility]. We consider it a privelige to serve you.  Delivering excellent health care in a caring and  compassionate way is our goal.  Partnering with you,  so that together we can achieve this goal is our strategy.   Change in vit D management following pt call, she is to continue vit D once per month per endo guidelines, Dr Dorris Fetch is prescribing this and order to d/c same will be cancelled

## 2016-12-18 NOTE — Assessment & Plan Note (Signed)
After obtaining informed consent, the vaccine is  administered by LPN.  

## 2016-12-18 NOTE — Assessment & Plan Note (Signed)
Normalized Patient educated about the importance of limiting  Carbohydrate intake , the need to commit to daily physical activity for a minimum of 30 minutes , and to commit weight loss. The fact that changes in all these areas will reduce or eliminate all together the development of diabetes is stressed.   Diabetic Labs Latest Ref Rng & Units 12/11/2016 12/12/2015 06/21/2015 03/14/2015 11/30/2014  HbA1c <5.7 % 5.5 5.6 5.6 5.9(H) -  Chol <200 mg/dL 219(H) 184 - 174 -  HDL >50 mg/dL 66 51 - 68 -  Calc LDL <100 mg/dL 136(H) 118 - 89 -  Triglycerides <150 mg/dL 83 75 - 83 -  Creatinine 0.50 - 0.99 mg/dL 0.88 0.87 - - 0.71   BP/Weight 12/18/2016 11/21/2016 10/10/2016 08/29/2016 08/20/2016 08/01/2016 [id number]  Systolic BP 123456 [id number] 123456 [id number]  Diastolic BP 74 70 66 67 78 63 68  Wt. (Lbs) 115 114 111 109 108 108 109  BMI 21.03 20.85 20.3 19.94 20.41 20.41 19.93  Some encounter information is confidential and restricted. Go to Review Flowsheets activity to see all data.   No flowsheet data found.

## 2016-12-30 ENCOUNTER — Encounter (HOSPITAL_COMMUNITY): Payer: Self-pay

## 2016-12-30 ENCOUNTER — Emergency Department (HOSPITAL_COMMUNITY): Payer: PPO

## 2016-12-30 ENCOUNTER — Emergency Department (HOSPITAL_COMMUNITY)
Admission: EM | Admit: 2016-12-30 | Discharge: 2016-12-30 | Disposition: A | Discharge status: Home / Self Care | Payer: PPO | Attending: Emergency Medicine | Admitting: Emergency Medicine

## 2016-12-30 DIAGNOSIS — Z79899 Other long term (current) drug therapy: Secondary | ICD-10-CM | POA: Insufficient documentation

## 2016-12-30 DIAGNOSIS — Z853 Personal history of malignant neoplasm of breast: Secondary | ICD-10-CM | POA: Insufficient documentation

## 2016-12-30 DIAGNOSIS — R05 Cough: Secondary | ICD-10-CM | POA: Diagnosis not present

## 2016-12-30 DIAGNOSIS — J069 Acute upper respiratory infection, unspecified: Secondary | ICD-10-CM | POA: Diagnosis not present

## 2016-12-30 DIAGNOSIS — J40 Bronchitis, not specified as acute or chronic: Secondary | ICD-10-CM | POA: Diagnosis not present

## 2016-12-30 MED ORDER — DM-GUAIFENESIN ER 30-600 MG PO TB12: 1.0000 | ORAL_TABLET | Freq: Two times a day (BID) | ORAL | 1 refills | Status: DC

## 2016-12-30 NOTE — ED Provider Notes (Signed)
[facility] DEPT Provider Note   CSN: [id number] Arrival date & time: 12/30/16  1442     History   Chief Complaint Chief Complaint  Patient presents with  . Generalized Body Aches  . Cough    HPI Megan Gibbs is a 65 y.o. female.  Patient with five-day history of feelings of fever and chills congestion cough reductive cough. Sinus pressure sinus congestion. Patient's been taking over-the-counter medicine from [employer] some type of decongestant and something similar to Mucinex. Patient did have the flu shot this year. Patient also had the pneumonia shot.      Past Medical History:  Diagnosis Date  . Allergic rhinitis   . Anemia   . Anxiety   . Back pain   . Depression   . Hypokalemia   . Osteoporosis   . Prediabetes   . Seasonal allergies   . Vertigo   . Wears glasses     Patient Active Problem List   Diagnosis Date Noted  . Need for 23-polyvalent pneumococcal polysaccharide vaccine 03/18/2015  . Papilloma of right breast 12/01/2014  . FH: breast cancer in first degree relative 10/11/2014  . Prediabetes 10/11/2014  . FH: type 2 diabetes mellitus 10/11/2014  . Osteoporosis 12/12/2010  . 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 DEPRESSION, SEVERE 07/12/2009  . Anxiety state 11/09/2008  . Allergic rhinitis 04/07/2008    Past Surgical History:  Procedure Laterality Date  . BACK SURGERY  1992 & 2009   Dr. Joya Salm   . BREAST LUMPECTOMY WITH RADIOACTIVE SEED LOCALIZATION Right 12/01/2014   Procedure: RIGHT BREAST LUMPECTOMY WITH RADIOACTIVE SEED LOCALIZATION;  Surgeon: Fanny Skates, MD;  Location: [facility] [facility];  Service: General;  Laterality: Right;  . COLONOSCOPY N/A 04/19/2014   Procedure: COLONOSCOPY;  Surgeon: Daneil Dolin, MD;  [facility];  Service: Endoscopy;  Laterality: N/A;  9:30 AM  . DILATION AND CURETTAGE OF UTERUS    . TUBAL LIGATION     1976    OB History    No data available        Home Medications    Prior to Admission medications   Medication Sig Start Date End Date Taking? Authorizing Provider  alendronate (FOSAMAX) 70 MG tablet Take 1 tablet (70 mg total) by mouth once a week. Take with a full glass of water on an empty stomach. 04/24/16   Cassandria Anger, MD  aspirin (ASPIRIN LOW DOSE) 81 MG EC tablet Take 81 mg by mouth daily.      Historical Provider, MD  calcium-vitamin D (OSCAL WITH D) 500-200 MG-UNIT per tablet Take 1 tablet by mouth daily with breakfast.    Fayrene Helper, MD  cetirizine (ZYRTEC) 10 MG chewable tablet Chew 10 mg by mouth daily.    Historical Provider, MD  dextromethorphan-guaiFENesin (MUCINEX DM) 30-600 MG 12hr tablet Take 1 tablet by mouth 2 (two) times daily. 12/30/16   Fredia Sorrow, MD  ergocalciferol (VITAMIN D2) 50000 units capsule One capsule once every 28 days 12/18/16   Fayrene Helper, MD    Family History Family History  Problem Relation Age of Onset  . Diabetes Mother   . Hypertension Mother   . Asthma Sister   . Thyroid disease Sister   . Diabetes Sister   . Hypertension Sister   . Schizophrenia Father   . Drug abuse Brother   . Alcohol abuse Brother   . Sleep apnea Grandchild   . ADD / ADHD  Grandchild   . Seizures Grandchild   . Anxiety disorder Maternal Aunt   . Colon cancer Neg Hx     Social History Social History  Substance Use Topics  . Smoking status: Never Smoker  . Smokeless tobacco: Never Used  . Alcohol use No     Allergies   Patient has no known allergies.   Review of Systems Review of Systems  Constitutional: Positive for chills, diaphoresis and fever.  HENT: Positive for congestion, sinus pressure and voice change.   Eyes: Negative for visual disturbance.  Respiratory: Positive for cough. Negative for shortness of breath.   Gastrointestinal: Negative for abdominal pain.  Genitourinary: Negative for dysuria.  Musculoskeletal: Negative for back pain.  Skin: Negative for  rash.  Neurological: Negative for headaches.  Hematological: Does not bruise/bleed easily.  Psychiatric/Behavioral: Negative for confusion.     Physical Exam Updated Vital Signs BP 115/75 (BP Location: Left Arm)   Pulse 89   Temp 98.9 F (37.2 C) (Oral)   Resp 14   Ht 5\' 2"  (1.575 m)   Wt 52.2 kg   SpO2 99%   BMI 21.03 kg/m   Physical Exam  Constitutional: She is oriented to person, place, and time. She appears well-developed and well-nourished. No distress.  HENT:  Head: Normocephalic and atraumatic.  Mouth/Throat: Oropharynx is clear and moist.  Eyes: Conjunctivae and EOM are normal. Pupils are equal, round, and reactive to light.  Neck: Normal range of motion. Neck supple.  Cardiovascular: Normal rate, regular rhythm and normal heart sounds.   Pulmonary/Chest: Effort normal and breath sounds normal. No respiratory distress. She has no wheezes.  Abdominal: Soft. Bowel sounds are normal. There is no tenderness.  Musculoskeletal: Normal range of motion.  Neurological: She is alert and oriented to person, place, and time. No cranial nerve deficit or sensory deficit. She exhibits normal muscle tone. Coordination normal.  Skin: Skin is warm. Capillary refill takes less than 2 seconds.  Nursing note and vitals reviewed.    ED Treatments / Results  Labs (all labs ordered are listed, but only abnormal results are displayed) Labs Reviewed - No data to display  EKG  EKG Interpretation None       Radiology Dg Chest 2 View  Result Date: 12/30/2016 CLINICAL DATA:  Cough, cold symptoms, chills, back pain, x 5 days. Hx: Non-smoker, no other hx. EXAM: CHEST  2 VIEW COMPARISON:  None. FINDINGS: The heart size and mediastinal contours are within normal limits. Both lungs are clear. The visualized skeletal structures are unremarkable. IMPRESSION: No active cardiopulmonary disease. Electronically Signed   By: Nolon Nations M.D.   On: 12/30/2016 15:59    Procedures Procedures  (including critical care time)  Medications Ordered in ED Medications - No data to display   Initial Impression / Assessment and Plan / ED Course  I have reviewed the triage vital signs and the nursing notes.  Pertinent labs & imaging results that were available during my care of the patient were reviewed by me and considered in my medical decision making (see chart for details).  Clinical Course     The patient's symptoms consistent with a viral upper respiratory infection bronchitis. Always possible mild case of the flu but not typical flu symptoms. Patient nontoxic no acute distress. Chest x-ray negative for pneumonia. Will treat symptomatically with cough suppressant something to loosen the phlegm and a decongestant.  Final Clinical Impressions(s) / ED Diagnoses   Final diagnoses:  Viral upper respiratory tract infection  Bronchitis    New Prescriptions New Prescriptions   DEXTROMETHORPHAN-GUAIFENESIN (MUCINEX DM) 30-600 MG 12HR TABLET    Take 1 tablet by mouth 2 (two) times daily.     Fredia Sorrow, MD 12/30/16 [phone]

## 2016-12-30 NOTE — Discharge Instructions (Signed)
As we discussed take the Mucinex DM to loosen the phlegm and suppress the cough. Also would recommend a decongestant for the sinus pressure. Return for any new or worse symptoms.

## 2016-12-30 NOTE — ED Triage Notes (Signed)
Reports of cough, bodyaches and chills since Tuesday. Unknown fevers.

## 2017-01-01 ENCOUNTER — Encounter: Payer: Self-pay | Admitting: Family Medicine

## 2017-01-01 ENCOUNTER — Ambulatory Visit (INDEPENDENT_AMBULATORY_CARE_PROVIDER_SITE_OTHER): Payer: PPO | Admitting: Family Medicine

## 2017-01-01 VITALS — BP 130/70 | HR 88 | Temp 98.4°F | Resp 24 | Ht 62.0 in | Wt 114.1 lb

## 2017-01-01 DIAGNOSIS — J189 Pneumonia, unspecified organism: Secondary | ICD-10-CM

## 2017-01-01 DIAGNOSIS — J181 Lobar pneumonia, unspecified organism: Secondary | ICD-10-CM

## 2017-01-01 MED ORDER — LEVOFLOXACIN 500 MG PO TABS: 500.0000 mg | ORAL_TABLET | Freq: Every day | ORAL | 0 refills | Status: DC

## 2017-01-01 MED ORDER — BENZONATATE 100 MG PO CAPS: 100.0000 mg | ORAL_CAPSULE | Freq: Three times a day (TID) | ORAL | 0 refills | Status: DC | PRN

## 2017-01-01 NOTE — Progress Notes (Signed)
Chief Complaint  Patient presents with  . Follow-up    er,,, uri   Patient is here for ER follow-up. She went to the emergency room for an upper respiratory infection and cough. She states that her symptoms have worsened over the last 2 days. Has now had cough, weakness, sputum production, sweats and chills for over a week. The sputum is purulent, smells bad and taste bad. She was treated with fluids and cough suppressants. She had presumed viral bronchitis. She does not have any underlying lung disease or COPD. No known exposure to illness. She is up-to-date with influenza vaccine and pneumonia vaccines.  Patient Active Problem List   Diagnosis Date Noted  . Need for 23-polyvalent pneumococcal polysaccharide vaccine 03/18/2015  . Papilloma of right breast 12/01/2014  . FH: breast cancer in first degree relative 10/11/2014  . Prediabetes 10/11/2014  . FH: type 2 diabetes mellitus 10/11/2014  . Osteoporosis 12/12/2010  . 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 DEPRESSION, SEVERE 07/12/2009  . Anxiety state 11/09/2008  . Allergic rhinitis 04/07/2008    Outpatient Encounter Prescriptions as of 01/01/2017  Medication Sig  . alendronate (FOSAMAX) 70 MG tablet Take 1 tablet (70 mg total) by mouth once a week. Take with a full glass of water on an empty stomach.  [name] aspirin (ASPIRIN LOW DOSE) 81 MG EC tablet Take 81 mg by mouth daily.    . calcium-vitamin D (OSCAL WITH D) 500-200 MG-UNIT per tablet Take 1 tablet by mouth daily with breakfast.  . cetirizine (ZYRTEC) 10 MG chewable tablet Chew 10 mg by mouth daily.  [name] dextromethorphan-guaiFENesin (MUCINEX DM) 30-600 MG 12hr tablet Take 1 tablet by mouth 2 (two) times daily.  . ergocalciferol (VITAMIN D2) 50000 units capsule One capsule once every 28 days  . benzonatate (TESSALON) 100 MG capsule Take 1 capsule (100 mg total) by mouth 3 (three) times daily as needed for cough.  [name] levofloxacin (LEVAQUIN) 500 MG tablet Take  1 tablet (500 mg total) by mouth daily.   No facility-administered encounter medications on file as of 01/01/2017.     No Known Allergies  Review of Systems  Constitutional: Positive for chills, fatigue and fever.  HENT: Positive for postnasal drip, rhinorrhea and voice change. Negative for congestion.        Hoarse  Eyes: Negative for redness and visual disturbance.  Respiratory: Positive for cough, chest tightness and shortness of breath.   Cardiovascular: Positive for chest pain. Negative for palpitations and leg swelling.  Gastrointestinal: Positive for nausea. Negative for constipation, diarrhea and vomiting.  Genitourinary: Negative for difficulty urinating, flank pain and frequency.  Musculoskeletal: Positive for myalgias. Negative for arthralgias, neck pain and neck stiffness.  Neurological: Negative for dizziness, light-headedness and headaches.  Psychiatric/Behavioral: Negative.     BP 130/70 (BP Location: Right Arm, Patient Position: Sitting, Cuff Size: Normal)   Pulse 88   Temp 98.4 F (36.9 C) (Oral)   Resp (!) 24   Ht 5\' 2"  (1.575 m)   Wt 114 lb 1.9 oz (51.8 kg)   SpO2 96%   BMI 20.87 kg/m   Physical Exam  Constitutional: She is oriented to person, place, and time. She appears well-developed and well-nourished. She appears distressed.  Speaks in whispers. Appears fatigued.  HENT:  Head: Normocephalic and atraumatic.  Right Ear: External ear normal.  Left Ear: External ear normal.  Nose: Nose normal.  Mouth/Throat: Oropharynx is clear and moist.  No sinus tenderness  Eyes:  Conjunctivae are normal. Pupils are equal, round, and reactive to light.  Neck: Normal range of motion. Neck supple.  Cardiovascular: Normal rate, regular rhythm and normal heart sounds.   Pulmonary/Chest: Effort normal. No respiratory distress. She has no wheezes. She has rales.  Rales right base  Abdominal: Soft. Bowel sounds are normal.  Musculoskeletal: Normal range of motion. She  exhibits no edema.  No cyanosis  Lymphadenopathy:    She has no cervical adenopathy.  Neurological: She is alert and oriented to person, place, and time.  Psychiatric: She has a normal mood and affect. Her behavior is normal. Thought content normal.    ASSESSMENT/PLAN:  1. Community acquired pneumonia of right lower lobe of lung [facility])    Patient Instructions  use a humidifier if available Rest Push fluids Take the antibiotic as directed Use the cough medicine as needed Call if not better by the end of the week  If you get worse or start vomiting, go to the ER     Raylene Everts, MD

## 2017-01-01 NOTE — Patient Instructions (Signed)
use a humidifier if available Rest Push fluids Take the antibiotic as directed Use the cough medicine as needed Call if not better by the end of the week  If you get worse or start vomiting, go to the ER

## 2017-01-21 DIAGNOSIS — F419 Anxiety disorder, unspecified: Secondary | ICD-10-CM | POA: Diagnosis not present

## 2017-01-23 ENCOUNTER — Ambulatory Visit (INDEPENDENT_AMBULATORY_CARE_PROVIDER_SITE_OTHER): Payer: PPO | Admitting: Orthopaedic Surgery

## 2017-01-23 VITALS — BP 108/66 | HR 72 | Temp 97.5°F | Ht 62.0 in | Wt 115.0 lb

## 2017-01-23 DIAGNOSIS — M25512 Pain in left shoulder: Secondary | ICD-10-CM

## 2017-01-23 DIAGNOSIS — G8929 Other chronic pain: Secondary | ICD-10-CM

## 2017-01-23 MED ORDER — HYDROCODONE-ACETAMINOPHEN 5-325 MG PO TABS: 1.0000 | ORAL_TABLET | Freq: Four times a day (QID) | ORAL | 0 refills | Status: DC | PRN

## 2017-01-23 NOTE — Progress Notes (Signed)
Patient [id number] Megan Gibbs, female DOB:11-Dec-1951, 66 y.o. [id number]  Chief Complaint  Patient presents with  . Follow-up    chronic left shoulder pain    HPI  Megan Gibbs is a 66 y.o. female who has chronic pain of the left shoulder.  She has been doing her exercises.  She has no paresthesias.  She has no redness. HPI  Body mass index is 21.03 kg/m.  ROS  Review of Systems  HENT: Negative for congestion.   Respiratory: Negative for cough and shortness of breath.   Cardiovascular: Negative for chest pain and leg swelling.  Endocrine: Positive for cold intolerance.  Musculoskeletal: Positive for arthralgias and back pain.  Allergic/Immunologic: Positive for environmental allergies.  Psychiatric/Behavioral: The patient is nervous/anxious.     Past Medical History:  Diagnosis Date  . Allergic rhinitis   . Anemia   . Anxiety   . Back pain   . Depression   . Hypokalemia   . Osteoporosis   . Prediabetes   . Seasonal allergies   . Vertigo   . Wears glasses     Past Surgical History:  Procedure Laterality Date  . BACK SURGERY  1992 & 2009   Dr. Joya Salm   . BREAST LUMPECTOMY WITH RADIOACTIVE SEED LOCALIZATION Right 12/01/2014   Procedure: RIGHT BREAST LUMPECTOMY WITH RADIOACTIVE SEED LOCALIZATION;  Surgeon: Fanny Skates, MD;  Location: [place];  Service: General;  Laterality: Right;  . COLONOSCOPY N/A 04/19/2014   Procedure: COLONOSCOPY;  Surgeon: Daneil Dolin, MD;  [facility];  Service: Endoscopy;  Laterality: N/A;  9:30 AM  . DILATION AND CURETTAGE OF UTERUS    . TUBAL LIGATION     1976    Family History  Problem Relation Age of Onset  . Diabetes Mother   . Hypertension Mother   . Asthma Sister   . Thyroid disease Sister   . Diabetes Sister   . Hypertension Sister   . Schizophrenia Father   . Drug abuse Brother   . Alcohol abuse Brother   . Sleep apnea Grandchild   . ADD / ADHD Grandchild   . Seizures Grandchild   . Anxiety  disorder Maternal Aunt   . Colon cancer Neg Hx     Social History Social History  Substance Use Topics  . Smoking status: Never Smoker  . Smokeless tobacco: Never Used  . Alcohol use No    No Known Allergies  Current Outpatient Prescriptions  Medication Sig Dispense Refill  . alendronate (FOSAMAX) 70 MG tablet Take 1 tablet (70 mg total) by mouth once a week. Take with a full glass of water on an empty stomach. 12 tablet 4  . aspirin (ASPIRIN LOW DOSE) 81 MG EC tablet Take 81 mg by mouth daily.      . benzonatate (TESSALON) 100 MG capsule Take 1 capsule (100 mg total) by mouth 3 (three) times daily as needed for cough. 30 capsule 0  . calcium-vitamin D (OSCAL WITH D) 500-200 MG-UNIT per tablet Take 1 tablet by mouth daily with breakfast.    . cetirizine (ZYRTEC) 10 MG chewable tablet Chew 10 mg by mouth daily.    [name] dextromethorphan-guaiFENesin (MUCINEX DM) 30-600 MG 12hr tablet Take 1 tablet by mouth 2 (two) times daily. 14 tablet 1  . ergocalciferol (VITAMIN D2) 50000 units capsule One capsule once every 28 days  1  . HYDROcodone-acetaminophen (NORCO/VICODIN) 5-325 MG tablet Take 1 tablet by mouth every 6 (six) hours as needed for  moderate pain (Must last 30 days.Do not take and drive a car or use machinery.). 60 tablet 0  . levofloxacin (LEVAQUIN) 500 MG tablet Take 1 tablet (500 mg total) by mouth daily. 7 tablet 0   No current facility-administered medications for this visit.      Physical Exam  Blood pressure 108/66, pulse 72, temperature 97.5 F (36.4 C), height 5\' 2"  (1.575 m), weight 115 lb (52.2 kg).  Constitutional: overall normal hygiene, normal nutrition, well developed, normal grooming, normal body habitus. Assistive device:none  Musculoskeletal: gait and station Limp none, muscle tone and strength are normal, no tremors or atrophy is present.  .  Neurological: coordination overall normal.  Deep tendon reflex/nerve stretch intact.  Sensation normal.  Cranial  nerves II-XII intact.   Skin:   Normal overall no scars, lesions, ulcers or rashes. No psoriasis.  Psychiatric: Alert and oriented x 3.  Recent memory intact, remote memory unclear.  Normal mood and affect. Well groomed.  Good eye contact.  Cardiovascular: overall no swelling, no varicosities, no edema bilaterally, normal temperatures of the legs and arms, no clubbing, cyanosis and good capillary refill.  Lymphatic: palpation is normal.  Examination of left Upper Extremity is done.  Inspection:   Overall:  Elbow non-tender without crepitus or defects, forearm non-tender without crepitus or defects, wrist non-tender without crepitus or defects, hand non-tender.    Shoulder: with glenohumeral joint tenderness, without effusion.   Upper arm: without swelling and tenderness   Range of motion:   Overall:  Full range of motion of the elbow, full range of motion of wrist and full range of motion in fingers.   Shoulder:  left  180 degrees forward flexion; 165 degrees abduction; 35 degrees internal rotation, 35 degrees external rotation, 20 degrees extension, 40 degrees adduction.   Stability:   Overall:  Shoulder, elbow and wrist stable   Strength and Tone:   Overall full shoulder muscles strength, full upper arm strength and normal upper arm bulk and tone.   The patient has been educated about the nature of the problem(s) and counseled on treatment options.  The patient appeared to understand what I have discussed and is in agreement with it.  Encounter Diagnosis  Name Primary?  . Chronic left shoulder pain Yes    PLAN Call if any problems.  Precautions discussed.  Continue current medications.   Return to clinic 3 months   Pain medicine given after first checking the state narcotic site.  Electronically Signed Sanjuana Kava, MD 1/24/20189:03 AM

## 2017-04-17 ENCOUNTER — Other Ambulatory Visit: Payer: Self-pay | Admitting: "Endocrinology

## 2017-04-17 DIAGNOSIS — R7303 Prediabetes: Secondary | ICD-10-CM | POA: Diagnosis not present

## 2017-04-17 DIAGNOSIS — M810 Age-related osteoporosis without current pathological fracture: Secondary | ICD-10-CM

## 2017-04-17 DIAGNOSIS — E559 Vitamin D deficiency, unspecified: Secondary | ICD-10-CM | POA: Diagnosis not present

## 2017-04-17 LAB — COMPLETE METABOLIC PANEL WITH GFR
ALT: 14 U/L (ref 6–29)
AST: 19 U/L (ref 10–35)
Albumin: 4.4 g/dL (ref 3.6–5.1)
Alkaline Phosphatase: 76 U/L (ref 33–130)
BUN: 14 mg/dL (ref 7–25)
CHLORIDE: 106 mmol/L (ref 98–110)
CO2: 28 mmol/L (ref 20–31)
Calcium: 9.6 mg/dL (ref 8.6–10.4)
Creat: 0.73 mg/dL (ref 0.50–0.99)
GFR, Est Non African American: 87 mL/min (ref >=60)
Glucose, Bld: 88 mg/dL (ref 65–99)
Potassium: 4.5 mmol/L (ref 3.5–5.3)
SODIUM: 141 mmol/L (ref 135–146)
Total Bilirubin: 0.4 mg/dL (ref 0.2–1.2)
Total Protein: 7.2 g/dL (ref 6.1–8.1)

## 2017-04-17 LAB — LIPID PANEL
Cholesterol: 185 mg/dL (ref <200)
HDL: 61 mg/dL (ref >50)
LDL CALC: 107 mg/dL — AB (ref <100)
TRIGLYCERIDES: 86 mg/dL (ref <150)
Total CHOL/HDL Ratio: 3 Ratio (ref <5.0)
VLDL: 17 mg/dL (ref <30)

## 2017-04-18 ENCOUNTER — Encounter: Payer: Self-pay | Admitting: Family Medicine

## 2017-04-18 LAB — VITAMIN D 25 HYDROXY (VIT D DEFICIENCY, FRACTURES): Vit D, 25-Hydroxy: 58 ng/mL (ref 30–100)

## 2017-04-23 ENCOUNTER — Encounter: Payer: Self-pay | Admitting: Orthopaedic Surgery

## 2017-04-23 ENCOUNTER — Ambulatory Visit (INDEPENDENT_AMBULATORY_CARE_PROVIDER_SITE_OTHER): Payer: PPO | Admitting: Orthopaedic Surgery

## 2017-04-23 VITALS — BP 96/57 | HR 69 | Temp 97.9°F | Ht 62.0 in | Wt 113.0 lb

## 2017-04-23 DIAGNOSIS — G8929 Other chronic pain: Secondary | ICD-10-CM | POA: Diagnosis not present

## 2017-04-23 DIAGNOSIS — M25512 Pain in left shoulder: Secondary | ICD-10-CM

## 2017-04-23 NOTE — Progress Notes (Signed)
Patient Megan Gibbs, female DOB:1951/04/29, 66 y.o. XBL:[id number]  Chief Complaint  Patient presents with  . Follow-up    Chronic left shoulder pain    HPI  Megan Gibbs is a 66 y.o. female who has had chronic pain of the left shoulder.  She is doing well. She has no pain.  She has full motion.  She has no new trauma.  She has no paresthesias. HPI  Body mass index is 20.67 kg/m.  ROS  Review of Systems  HENT: Negative for congestion.   Respiratory: Negative for cough and shortness of breath.   Cardiovascular: Negative for chest pain and leg swelling.  Endocrine: Positive for cold intolerance.  Musculoskeletal: Positive for arthralgias and back pain.  Allergic/Immunologic: Positive for environmental allergies.  Psychiatric/Behavioral: The patient is nervous/anxious.     Past Medical History:  Diagnosis Date  . Allergic rhinitis   . Anemia   . Anxiety   . Back pain   . Depression   . Hypokalemia   . Osteoporosis   . Prediabetes   . Seasonal allergies   . Vertigo   . Wears glasses     Past Surgical History:  Procedure Laterality Date  . BACK SURGERY  1992 & 2009   Dr. Joya Salm   . BREAST LUMPECTOMY WITH RADIOACTIVE SEED LOCALIZATION Right 12/01/2014   Procedure: RIGHT BREAST LUMPECTOMY WITH RADIOACTIVE SEED LOCALIZATION;  Surgeon: Fanny Skates, MD;  Location: [facility];  Service: General;  Laterality: Right;  . COLONOSCOPY N/A 04/19/2014   Procedure: COLONOSCOPY;  Surgeon: Daneil Dolin, MD;  [facility];  Service: Endoscopy;  Laterality: N/A;  9:30 AM  . DILATION AND CURETTAGE OF UTERUS    . TUBAL LIGATION     1976    Family History  Problem Relation Age of Onset  . Diabetes Mother   . Hypertension Mother   . Asthma Sister   . Thyroid disease Sister   . Diabetes Sister   . Hypertension Sister   . Schizophrenia Father   . Drug abuse Brother   . Alcohol abuse Brother   . Sleep apnea Grandchild   . ADD / ADHD Grandchild   .  Seizures Grandchild   . Anxiety disorder Maternal Aunt   . Colon cancer Neg Hx     Social History Social History  Substance Use Topics  . Smoking status: Never Smoker  . Smokeless tobacco: Never Used  . Alcohol use No    No Known Allergies  Current Outpatient Prescriptions  Medication Sig Dispense Refill  . alendronate (FOSAMAX) 70 MG tablet Take 1 tablet (70 mg total) by mouth once a week. Take with a full glass of water on an empty stomach. 12 tablet 4  . aspirin (ASPIRIN LOW DOSE) 81 MG EC tablet Take 81 mg by mouth daily.      . benzonatate (TESSALON) 100 MG capsule Take 1 capsule (100 mg total) by mouth 3 (three) times daily as needed for cough. 30 capsule 0  . calcium-vitamin D (OSCAL WITH D) 500-200 MG-UNIT per tablet Take 1 tablet by mouth daily with breakfast.    . cetirizine (ZYRTEC) 10 MG chewable tablet Chew 10 mg by mouth daily.    [name] dextromethorphan-guaiFENesin (MUCINEX DM) 30-600 MG 12hr tablet Take 1 tablet by mouth 2 (two) times daily. 14 tablet 1  . ergocalciferol (VITAMIN D2) 50000 units capsule One capsule once every 28 days  1  . HYDROcodone-acetaminophen (NORCO/VICODIN) 5-325 MG tablet Take 1 tablet  by mouth every 6 (six) hours as needed for moderate pain (Must last 30 days.Do not take and drive a car or use machinery.). 60 tablet 0  . levofloxacin (LEVAQUIN) 500 MG tablet Take 1 tablet (500 mg total) by mouth daily. 7 tablet 0   No current facility-administered medications for this visit.      Physical Exam  Blood pressure (!) 96/57, pulse 69, temperature 97.9 F (36.6 C), height 5\' 2"  (1.575 m), weight 113 lb (51.3 kg).  Constitutional: overall normal hygiene, normal nutrition, well developed, normal grooming, normal body habitus. Assistive device:none  Musculoskeletal: gait and station Limp none, muscle tone and strength are normal, no tremors or atrophy is present.  .  Neurological: coordination overall normal.  Deep tendon reflex/nerve stretch  intact.  Sensation normal.  Cranial nerves II-XII intact.   Skin:   Normal overall no scars, lesions, ulcers or rashes. No psoriasis.  Psychiatric: Alert and oriented x 3.  Recent memory intact, remote memory unclear.  Normal mood and affect. Well groomed.  Good eye contact.  Cardiovascular: overall no swelling, no varicosities, no edema bilaterally, normal temperatures of the legs and arms, no clubbing, cyanosis and good capillary refill.  Lymphatic: palpation is normal.  She has full ROM of the left shoulder, normal examination.  NV intact.  The patient has been educated about the nature of the problem(s) and counseled on treatment options.  The patient appeared to understand what I have discussed and is in agreement with it.  Encounter Diagnosis  Name Primary?  . Chronic left shoulder pain Yes    PLAN Call if any problems.  Precautions discussed.  Continue current medications.   Return to clinic prn   Electronically [place], MD 4/24/20189:41 AM

## 2017-04-24 ENCOUNTER — Ambulatory Visit: Payer: PPO | Admitting: "Endocrinology

## 2017-04-24 ENCOUNTER — Telehealth: Payer: Self-pay | Admitting: Family Medicine

## 2017-04-24 NOTE — Telephone Encounter (Signed)
Patient went to see her orthopedic yesterday, during her visit her blood pressure was low (96/57) , she states she has been having dizzy spells, and been very tired. She wants to know if this is something she should be concerned about. I made her an appointment with you for your next availability,  Monday 04/29/17.  Patients #: [id number]

## 2017-04-24 NOTE — Telephone Encounter (Signed)
She is on no bP meds, but if indeed taking four pain pills daily this will make her tired and fatigued please explain, keep 4/30 appt

## 2017-04-25 NOTE — Telephone Encounter (Signed)
Called patient and left message for them to return call at the office   

## 2017-04-25 NOTE — Telephone Encounter (Signed)
Called to discuss, no answer, left her a voicemail to call me back

## 2017-04-26 NOTE — Telephone Encounter (Signed)
Patient aware per [facility]

## 2017-04-29 ENCOUNTER — Encounter: Payer: Self-pay | Admitting: Family Medicine

## 2017-04-29 ENCOUNTER — Ambulatory Visit (INDEPENDENT_AMBULATORY_CARE_PROVIDER_SITE_OTHER): Payer: PPO | Admitting: Family Medicine

## 2017-04-29 VITALS — BP 110/70 | HR 71 | Resp 16 | Ht 62.0 in | Wt 114.8 lb

## 2017-04-29 DIAGNOSIS — H538 Other visual disturbances: Secondary | ICD-10-CM

## 2017-04-29 DIAGNOSIS — M818 Other osteoporosis without current pathological fracture: Secondary | ICD-10-CM | POA: Diagnosis not present

## 2017-04-29 DIAGNOSIS — J3089 Other allergic rhinitis: Secondary | ICD-10-CM | POA: Diagnosis not present

## 2017-04-29 DIAGNOSIS — F411 Generalized anxiety disorder: Secondary | ICD-10-CM

## 2017-04-29 NOTE — Assessment & Plan Note (Signed)
;  less stressed with improved health of her children

## 2017-04-29 NOTE — Assessment & Plan Note (Signed)
Controlled, no change in medication  

## 2017-04-29 NOTE — Assessment & Plan Note (Signed)
Continue weekly fosamax, daily calcium and vit D and weight bearing exercise

## 2017-04-29 NOTE — Patient Instructions (Signed)
Physical exam  First week in September, call if you need me before  Keep wellness visit as before  BLood -pressure and labs are good  Resume two BOOSTS daily   You are referred for re exam of eyes, you need to check coverage , then YOU decide if and when you can afford to follow through on this  Thank you  for choosing [facility]. We consider it a privelige to serve you.  Delivering excellent health care in a caring and  compassionate way is our goal.  Partnering with you,  so that together we can achieve this goal is our strategy.

## 2017-04-29 NOTE — Progress Notes (Signed)
   DYMON SUMMERHILL     MRN: [id number]      DOB: 1951/02/21   HPI Ms. Tavano is here c/o dizziness and blurry vision for past 4 months. States she had new glasses prescribed last Fall by a new Provider, but these have never worked for her , and she is back to wearing the old glasses she has had for over 6 years. Feels she needs eyes re examined , either by her prior Provider or by someone in [place], has concerns that Medicare will not cover the cost , and she will need to pay out of pocket, will see what this amounts to as time unfolds. She had lost a little weight in recent times but admits to having been under increased stress as her children were very ill but they are all now improved, and she has again increased her bOOST supplements  recently in orthio office  her BP was low and she c/o ftigue and light headedness and is here for re evaluation of this  ROS Denies recent fever or chills. Denies sinus pressure, nasal congestion, ear pain or sore throat. Denies chest congestion, productive cough or wheezing. Denies chest pains, palpitations and leg swelling Denies abdominal pain, nausea, vomiting,diarrhea or constipation.   Denies dysuria, frequency, hesitancy or incontinence. Denies joint pain, swelling and limitation in mobility. Denies headaches, seizures, numbness, or tingling. Denies skin break down or rash.   PE  BP 110/70   Pulse 71   Resp 16   Ht 5\' 2"  (1.575 m)   Wt 114 lb 12.8 oz (52.1 kg)   SpO2 100%   BMI 21.00 kg/m   Patient alert and oriented and in no cardiopulmonary distress.  HEENT: No facial asymmetry, EOMI,   oropharynx pink and moist.  Neck supple no JVD, no mass.  Chest: Clear to auscultation bilaterally.  CVS: S1, S2 no murmurs, no S3.Regular rate.  ABD: Soft non tender.   Ext: No edema  MS: Adequate ROM spine, shoulders, hips and knees.  Skin: Intact, no ulcerations or rash noted.  Psych: Good eye contact, normal affect. Memory intact not anxious or  depressed appearing.  CNS: CN 2-12 intact, power,  normal throughout.no focal deficits noted.   Assessment & Plan  Blurry vision, bilateral 4 month h/o blurry vision, was prescribed  New glasses last Oct by new Provider states  make her dizzy and unable to see, want re eval by regular Provider, will refer  Allergic rhinitis Controlled, no change in medication   Anxiety state ;less stressed with improved health of her children  Osteoporosis Continue weekly fosamax, daily calcium and vit D and weight bearing exercise

## 2017-04-29 NOTE — Assessment & Plan Note (Signed)
4 month h/o blurry vision, was prescribed  New glasses last Oct by new Provider states  make her dizzy and unable to see, want re eval by regular Provider, will refer

## 2017-05-03 ENCOUNTER — Other Ambulatory Visit: Payer: Self-pay | Admitting: "Endocrinology

## 2017-05-09 ENCOUNTER — Ambulatory Visit (INDEPENDENT_AMBULATORY_CARE_PROVIDER_SITE_OTHER): Payer: PPO

## 2017-05-09 VITALS — BP 128/62 | HR 70 | Temp 98.9°F | Ht 62.0 in | Wt 115.0 lb

## 2017-05-09 DIAGNOSIS — Z0000 Encounter for general adult medical examination without abnormal findings: Secondary | ICD-10-CM | POA: Diagnosis not present

## 2017-05-09 NOTE — Patient Instructions (Addendum)
[name] , Thank you for taking time to come for your Medicare Wellness Visit. I appreciate your ongoing commitment to your health goals. Please review the following plan we discussed and let me know if I can assist you in the future.   Screening recommendations/referrals: Colonoscopy: Up to date, next due 03/2024 Mammogram: Up to date, next due 09/2017 Bone Density: Up to date Recommended yearly ophthalmology/optometry visit for glaucoma screening and checkup Recommended yearly dental visit for hygiene and checkup  Vaccinations: Influenza vaccine: Up to date, next due 07/2017 Pneumococcal vaccine: Up to date Tdap vaccine: Up to date, next due 02/2020 Shingles vaccine: Done    Advanced directives: Advance directive discussed with you today. I have provided a copy for you to complete at home and have notarized. Once this is complete please bring a copy in to our office so we can scan it into your chart.  Conditions/risks identified: None  Next appointment: Follow up with Dr. Moshe Cipro on 09/17/2017 at 10:20 am. Follow up in 1 year for your annual wellness visit.   Preventive Care 65 Years and Older, Female Preventive care refers to lifestyle choices and visits with your health care provider that can promote health and wellness. What does preventive care include?  A yearly physical exam. This is also called an annual well check.  Dental exams once or twice a year.  Routine eye exams. Ask your health care provider how often you should have your eyes checked.  Personal lifestyle choices, including:  Daily care of your teeth and gums.  Regular physical activity.  Eating a healthy diet.  Avoiding tobacco and drug use.  Limiting alcohol use.  Practicing safe sex.  Taking low-dose aspirin every day.  Taking vitamin and mineral supplements as recommended by your health care provider. What happens during an annual well check? The services and screenings done by your health care  provider during your annual well check will depend on your age, overall health, lifestyle risk factors, and family history of disease. Counseling  Your health care provider may ask you questions about your:  Alcohol use.  Tobacco use.  Drug use.  Emotional well-being.  Home and relationship well-being.  Sexual activity.  Eating habits.  History of falls.  Memory and ability to understand (cognition).  Work and work Statistician.  Reproductive health. Screening  You may have the following tests or measurements:  Height, weight, and BMI.  Blood pressure.  Lipid and cholesterol levels. These may be checked every 5 years, or more frequently if you are over 6 years old.  Skin check.  Lung cancer screening. You may have this screening every year starting at age 77 if you have a 30-pack-year history of smoking and currently smoke or have quit within the past 15 years.  Fecal occult blood test (FOBT) of the stool. You may have this test every year starting at age 60.  Flexible sigmoidoscopy or colonoscopy. You may have a sigmoidoscopy every 5 years or a colonoscopy every 10 years starting at age 77.  Hepatitis C blood test.  Hepatitis B blood test.  Sexually transmitted disease (STD) testing.  Diabetes screening. This is done by checking your blood sugar (glucose) after you have not eaten for a while (fasting). You may have this done every 1-3 years.  Bone density scan. This is done to screen for osteoporosis. You may have this done starting at age 22.  Mammogram. This may be done every 1-2 years. Talk to your health care provider about  how often you should have regular mammograms. Talk with your health care provider about your test results, treatment options, and if necessary, the need for more tests. Vaccines  Your health care provider may recommend certain vaccines, such as:  Influenza vaccine. This is recommended every year.  Tetanus, diphtheria, and acellular  pertussis (Tdap, Td) vaccine. You may need a Td booster every 10 years.  Zoster vaccine. You may need this after age 38.  Pneumococcal 13-valent conjugate (PCV13) vaccine. One dose is recommended after age 64.  Pneumococcal polysaccharide (PPSV23) vaccine. One dose is recommended after age 15. Talk to your health care provider about which screenings and vaccines you need and how often you need them. This information is not intended to replace advice given to you by your health care provider. Make sure you discuss any questions you have with your health care provider. Document Released: 01/13/2016 Document Revised: 09/05/2016 Document Reviewed: 10/18/2015 Elsevier Interactive Patient Education  2017 [place] Prevention in the Home Falls can cause injuries. They can happen to people of all ages. There are many things you can do to make your home safe and to help prevent falls. What can I do on the outside of my home?  Regularly fix the edges of walkways and driveways and fix any cracks.  Remove anything that might make you trip as you walk through a door, such as a raised step or threshold.  Trim any bushes or trees on the path to your home.  Use bright outdoor lighting.  Clear any walking paths of anything that might make someone trip, such as rocks or tools.  Regularly check to see if handrails are loose or broken. Make sure that both sides of any steps have handrails.  Any raised decks and porches should have guardrails on the edges.  Have any leaves, snow, or ice cleared regularly.  Use sand or salt on walking paths during winter.  Clean up any spills in your garage right away. This includes oil or grease spills. What can I do in the bathroom?  Use night lights.  Install grab bars by the toilet and in the tub and shower. Do not use towel bars as grab bars.  Use non-skid mats or decals in the tub or shower.  If you need to sit down in the shower, use a plastic,  non-slip stool.  Keep the floor dry. Clean up any water that spills on the floor as soon as it happens.  Remove soap buildup in the tub or shower regularly.  Attach bath mats securely with double-sided non-slip rug tape.  Do not have throw rugs and other things on the floor that can make you trip. What can I do in the bedroom?  Use night lights.  Make sure that you have a light by your bed that is easy to reach.  Do not use any sheets or blankets that are too big for your bed. They should not hang down onto the floor.  Have a firm chair that has side arms. You can use this for support while you get dressed.  Do not have throw rugs and other things on the floor that can make you trip. What can I do in the kitchen?  Clean up any spills right away.  Avoid walking on wet floors.  Keep items that you use a lot in easy-to-reach places.  If you need to reach something above you, use a strong step stool that has a grab bar.  Keep  electrical cords out of the way.  Do not use floor polish or wax that makes floors slippery. If you must use wax, use non-skid floor wax.  Do not have throw rugs and other things on the floor that can make you trip. What can I do with my stairs?  Do not leave any items on the stairs.  Make sure that there are handrails on both sides of the stairs and use them. Fix handrails that are broken or loose. Make sure that handrails are as long as the stairways.  Check any carpeting to make sure that it is firmly attached to the stairs. Fix any carpet that is loose or worn.  Avoid having throw rugs at the top or bottom of the stairs. If you do have throw rugs, attach them to the floor with carpet tape.  Make sure that you have a light switch at the top of the stairs and the bottom of the stairs. If you do not have them, ask someone to add them for you. What else can I do to help prevent falls?  Wear shoes that:  Do not have high heels.  Have rubber  bottoms.  Are comfortable and fit you well.  Are closed at the toe. Do not wear sandals.  If you use a stepladder:  Make sure that it is fully opened. Do not climb a closed stepladder.  Make sure that both sides of the stepladder are locked into place.  Ask someone to hold it for you, if possible.  Clearly mark and make sure that you can see:  Any grab bars or handrails.  First and last steps.  Where the edge of each step is.  Use tools that help you move around (mobility aids) if they are needed. These include:  Canes.  Walkers.  Scooters.  Crutches.  Turn on the lights when you go into a dark area. Replace any light bulbs as soon as they burn out.  Set up your furniture so you have a clear path. Avoid moving your furniture around.  If any of your floors are uneven, fix them.  If there are any pets around you, be aware of where they are.  Review your medicines with your doctor. Some medicines can make you feel dizzy. This can increase your chance of falling. Ask your doctor what other things that you can do to help prevent falls. This information is not intended to replace advice given to you by your health care provider. Make sure you discuss any questions you have with your health care provider. Document Released: 10/13/2009 Document Revised: 05/24/2016 Document Reviewed: 01/21/2015 Elsevier Interactive Patient Education  2017 [employer].

## 2017-05-09 NOTE — Progress Notes (Signed)
Subjective:   Megan Gibbs is a 66 y.o. female who presents for Medicare Annual (Subsequent) preventive examination.  Review of Systems:  Cardiac Risk Factors include: advanced age (>27men, >64 women)     Objective:     Vitals: BP 128/62   Pulse 70   Temp 98.9 F (37.2 C) (Oral)   Ht 5\' 2"  (1.575 m)   Wt 115 lb (52.2 kg)   SpO2 97%   BMI 21.03 kg/m   Body mass index is 21.03 kg/m.   Tobacco History  Smoking Status  . Never Smoker  Smokeless Tobacco  . Never Used     Counseling given: Not Answered   Past Medical History:  Diagnosis Date  . Allergic rhinitis   . Anemia   . Anxiety   . Back pain   . Depression   . Hypokalemia   . Osteoporosis   . Prediabetes   . Seasonal allergies   . Vertigo   . Wears glasses    Past Surgical History:  Procedure Laterality Date  . BACK SURGERY  1992 & 2009   Dr. Joya Salm   . BREAST LUMPECTOMY WITH RADIOACTIVE SEED LOCALIZATION Right 12/01/2014   Procedure: RIGHT BREAST LUMPECTOMY WITH RADIOACTIVE SEED LOCALIZATION;  Surgeon: Fanny Skates, MD;  Location: [facility];  Service: General;  Laterality: Right;  . COLONOSCOPY N/A 04/19/2014   Procedure: COLONOSCOPY;  Surgeon: Daneil Dolin, MD;  [facility];  Service: Endoscopy;  Laterality: N/A;  9:30 AM  . DILATION AND CURETTAGE OF UTERUS    . TUBAL LIGATION     1976   Family History  Problem Relation Age of Onset  . Diabetes Mother   . Hypertension Mother   . Asthma Sister   . Thyroid disease Sister   . COPD Sister   . Anemia Sister   . Diabetes Sister   . Hypertension Sister   . Diabetes Sister   . Schizophrenia Father   . Drug abuse Brother   . Alcohol abuse Brother   . Sleep apnea Grandchild   . ADD / ADHD Grandchild   . Seizures Grandchild   . Breast cancer Sister 53  . Thyroid disease Sister   . Hypertension Brother   . Anxiety disorder Maternal Aunt   . Hypertension Daughter   . Anemia Daughter   . Hypertension Daughter   .  Asthma Daughter   . Colon cancer Neg Hx    History  Sexual Activity  . Sexual activity: Yes  . Birth control/ protection: Post-menopausal    Outpatient Encounter Prescriptions as of 05/09/2017  Medication Sig  . alendronate (FOSAMAX) 70 MG tablet TAKE 1 TABLET ONCE A WEEK. TAKE WITH A FULL GLASS OF WATER ON AN EMPTY STOMACH.  [name] aspirin (ASPIRIN LOW DOSE) 81 MG EC tablet Take 81 mg by mouth daily.    . Calcium Carb-Cholecalciferol (CALCIUM 600+D3) 600-200 MG-UNIT TABS Take 1 tablet by mouth daily.  . cetirizine (ZYRTEC) 10 MG chewable tablet Chew 10 mg by mouth daily.  . ergocalciferol (VITAMIN D2) 50000 units capsule One capsule once every 28 days  . Multiple Vitamins-Minerals (EQ COMPLETE MULTIVITAMIN-ADULT) TABS Take 1 tablet by mouth daily.  . [DISCONTINUED] calcium-vitamin D (OSCAL WITH D) 500-200 MG-UNIT per tablet Take 1 tablet by mouth daily with breakfast.  . [DISCONTINUED] dextromethorphan-guaiFENesin (MUCINEX DM) 30-600 MG 12hr tablet Take 1 tablet by mouth 2 (two) times daily.   No facility-administered encounter medications on file as of 05/09/2017.  Activities of Daily Living In your present state of health, do you have any difficulty performing the following activities: 05/09/2017 01/01/2017  Hearing? N N  Vision? N N  Difficulty concentrating or making decisions? N N  Walking or climbing stairs? N N  Dressing or bathing? N N  Doing errands, shopping? Y N  Preparing Food and eating ? N -  Using the Toilet? N -  In the past six months, have you accidently leaked urine? N -  Do you have problems with loss of bowel control? N -  Managing your Medications? N -  Managing your Finances? N -  Housekeeping or managing your Housekeeping? N -  Some recent data might be hidden    Patient Care Team: Fayrene Helper, MD as PCP - General Rourk, Cristopher Estimable, MD as Consulting Physician (Gastroenterology) Lay, Bobbye Charleston, MD (Psychiatry) Cassandria Anger, MD as  Consulting Physician (Endocrinology) Madelin Headings, DO as Consulting Physician (Optometry)    Assessment:    Exercise Activities and Dietary recommendations Current Exercise Habits: Home exercise routine, Type of exercise: walking, Time (Minutes): 30, Frequency (Times/Week): 5, Weekly Exercise (Minutes/Week): 150, Intensity: Mild, Exercise limited by: None identified  Goals    None     Fall Risk Fall Risk  05/09/2017 01/01/2017 04/24/2016 12/19/2015 10/13/2015  Falls in the past year? No No No No No   Depression Screen PHQ 2/9 Scores 05/09/2017 01/01/2017 04/24/2016 10/13/2015  PHQ - 2 Score 0 0 0 0  PHQ- 9 Score - - - -     Cognitive Function: Normal  6CIT Screen 05/09/2017  What Year? 0 points  What month? 0 points  What time? 0 points  Count back from 20 0 points  Months in reverse 0 points  Repeat phrase 0 points  Total Score 0    Immunization History  Administered Date(s) Administered  . Influenza Split 09/24/2012  . Influenza Whole 10/04/2009, 09/29/2010, 09/17/2011  . Influenza,inj,Quad PF,36+ Mos 10/29/2013, 10/11/2014, 09/20/2015, 08/20/2016  . Pneumococcal Conjugate-13 03/17/2015  . Pneumococcal Polysaccharide-23 12/18/2016  . Td 03/24/2010  . Zoster 09/20/2011   Screening Tests Health Maintenance  Topic Date Due  . INFLUENZA VACCINE  07/31/2017  . PAP SMEAR  06/20/2018  . MAMMOGRAM  10/29/2018  . TETANUS/TDAP  03/24/2020  . COLONOSCOPY  04/19/2024  . DEXA SCAN  Completed  . Hepatitis C Screening  Completed  . HIV Screening  Completed  . PNA vac Low Risk Adult  Completed      Plan:     I have personally reviewed and noted the following in the patient's chart:   . Medical and social history . Use of alcohol, tobacco or illicit drugs  . Current medications and supplements . Functional ability and status . Nutritional status . Physical activity . Advanced directives . List of other physicians . Hospitalizations, surgeries, and ER visits in previous  12 months . Vitals . Screenings to include cognitive, depression, and falls . Referrals and appointments  In addition, I have reviewed and discussed with patient certain preventive protocols, quality metrics, and best practice recommendations. A written personalized care plan for preventive services as well as general preventive health recommendations were provided to patient.     Stormy Fabian, LPN  [id number]

## 2017-05-13 DIAGNOSIS — F419 Anxiety disorder, unspecified: Secondary | ICD-10-CM | POA: Diagnosis not present

## 2017-05-14 ENCOUNTER — Other Ambulatory Visit: Payer: Self-pay | Admitting: "Endocrinology

## 2017-05-14 DIAGNOSIS — M810 Age-related osteoporosis without current pathological fracture: Secondary | ICD-10-CM | POA: Diagnosis not present

## 2017-05-14 LAB — COMPREHENSIVE METABOLIC PANEL
ALT: 18 U/L (ref 6–29)
AST: 20 U/L (ref 10–35)
Albumin: 4.4 g/dL (ref 3.6–5.1)
Alkaline Phosphatase: 75 U/L (ref 33–130)
BILIRUBIN TOTAL: 0.4 mg/dL (ref 0.2–1.2)
BUN: 16 mg/dL (ref 7–25)
CHLORIDE: 105 mmol/L (ref 98–110)
CO2: 28 mmol/L (ref 20–31)
CREATININE: 0.74 mg/dL (ref 0.50–0.99)
Calcium: 9.5 mg/dL (ref 8.6–10.4)
GLUCOSE: 88 mg/dL (ref 65–99)
Potassium: 4.8 mmol/L (ref 3.5–5.3)
SODIUM: 141 mmol/L (ref 135–146)
Total Protein: 7.2 g/dL (ref 6.1–8.1)

## 2017-05-15 LAB — VITAMIN D 25 HYDROXY (VIT D DEFICIENCY, FRACTURES): Vit D, 25-Hydroxy: 60 ng/mL (ref 30–100)

## 2017-05-15 LAB — PTH, INTACT AND CALCIUM
CALCIUM: 9.5 mg/dL (ref 8.6–10.4)
PTH: 48 pg/mL (ref 14–64)

## 2017-05-21 ENCOUNTER — Ambulatory Visit (INDEPENDENT_AMBULATORY_CARE_PROVIDER_SITE_OTHER): Payer: Self-pay | Admitting: "Endocrinology

## 2017-05-21 ENCOUNTER — Encounter: Payer: Self-pay | Admitting: "Endocrinology

## 2017-05-21 VITALS — BP 122/74 | HR 69 | Ht 62.0 in | Wt 113.0 lb

## 2017-05-21 DIAGNOSIS — M818 Other osteoporosis without current pathological fracture: Secondary | ICD-10-CM

## 2017-05-21 MED ORDER — ALENDRONATE SODIUM 70 MG PO TABS
ORAL_TABLET | ORAL | 12 refills | Status: DC
Start: 2017-05-21 — End: 2018-05-21

## 2017-05-21 NOTE — Progress Notes (Signed)
Subjective:    Patient ID: Megan Gibbs, female    DOB: 1951/04/02,    Past Medical History:  Diagnosis Date  . Allergic rhinitis   . Anemia   . Anxiety   . Back pain   . Depression   . Hypokalemia   . Osteoporosis   . Prediabetes   . Seasonal allergies   . Vertigo   . Wears glasses    Past Surgical History:  Procedure Laterality Date  . BACK SURGERY  1992 & 2009   Dr. Joya Salm   . BREAST LUMPECTOMY WITH RADIOACTIVE SEED LOCALIZATION Right 12/01/2014   Procedure: RIGHT BREAST LUMPECTOMY WITH RADIOACTIVE SEED LOCALIZATION;  Surgeon: Fanny Skates, MD;  Location: [facility];  Service: General;  Laterality: Right;  . COLONOSCOPY N/A 04/19/2014   Procedure: COLONOSCOPY;  Surgeon: Daneil Dolin, MD;  [facility];  Service: Endoscopy;  Laterality: N/A;  9:30 AM  . DILATION AND CURETTAGE OF UTERUS    . TUBAL LIGATION     1976   Social History   Social History  . Marital status: Married    Spouse name: N/A  . Number of children: 3  . Years of education: N/A   Occupational History  . disabled  Unemployed   Social History Main Topics  . Smoking status: Never Smoker  . Smokeless tobacco: Never Used  . Alcohol use No  . Drug use: No  . Sexual activity: Yes    Birth control/ protection: Post-menopausal   Other Topics Concern  . None   Social History Narrative  . None   Outpatient Encounter Prescriptions as of 05/21/2017  Medication Sig  . alendronate (FOSAMAX) 70 MG tablet TAKE 1 TABLET ONCE A WEEK. TAKE WITH A FULL GLASS OF WATER ON AN EMPTY STOMACH.  [name] aspirin (ASPIRIN LOW DOSE) 81 MG EC tablet Take 81 mg by mouth daily.    . Calcium Carb-Cholecalciferol (CALCIUM 600+D3) 600-200 MG-UNIT TABS Take 1 tablet by mouth daily.  . cetirizine (ZYRTEC) 10 MG chewable tablet Chew 10 mg by mouth daily.  . Multiple Vitamins-Minerals (EQ COMPLETE MULTIVITAMIN-ADULT) TABS Take 1 tablet by mouth daily.  . [DISCONTINUED] alendronate (FOSAMAX) 70 MG  tablet TAKE 1 TABLET ONCE A WEEK. TAKE WITH A FULL GLASS OF WATER ON AN EMPTY STOMACH.  . [DISCONTINUED] ergocalciferol (VITAMIN D2) 50000 units capsule One capsule once every 28 days   No facility-administered encounter medications on file as of 05/21/2017.    ALLERGIES: No Known Allergies VACCINATION STATUS: Immunization History  Administered Date(s) Administered  . Influenza Split 09/24/2012  . Influenza Whole 10/04/2009, 09/29/2010, 09/17/2011  . Influenza,inj,Quad PF,36+ Mos 10/29/2013, 10/11/2014, 09/20/2015, 08/20/2016  . Pneumococcal Conjugate-13 03/17/2015  . Pneumococcal Polysaccharide-23 12/18/2016  . Td 03/24/2010  . Zoster 09/20/2011    HPI  66 yr old female with medical hx as above. she is here to f/u for her osteoporosis . she was started on Fosamax , which she is tolerating well. Her last DXA was in May 2015 which showed T -score of -2.5 on right femoral neck and -2.4 in L4 verteba. she denies any hx of fracture nor loss of height. -Her repeat bone density from a previous 2017 was reported as no statistical difference compared to the 1 from 2015. She has had lumbar disc prolapse for which she underwent surgery in 2002 and 2009. She denies weight loss. she has had menses until her early 46's. has 3 grown children. she denies parathyroid, thyroid dysfunction.  Review of Systems Constitutional:  She has steady weight,  no fatigue, no subjective hyperthermia/hypothermia Eyes: no blurry vision, no xerophthalmia ENT: no sore throat, no nodules palpated in throat, no dysphagia/odynophagia, no hoarseness Cardiovascular: no CP/SOB/palpitations/leg swelling Respiratory: no cough/SOB Gastrointestinal: no N/V/D/C Musculoskeletal: no muscle/joint aches Skin: no rashes Neurological: no tremors/numbness/tingling/dizziness Psychiatric: no depression/anxiety   Objective:    BP 122/74   Pulse 69   Ht 5\' 2"  (1.575 m)   Wt 113 lb (51.3 kg)   BMI 20.67 kg/m   Wt Readings  from Last 3 Encounters:  05/21/17 113 lb (51.3 kg)  05/09/17 115 lb (52.2 kg)  04/29/17 114 lb 12.8 oz (52.1 kg)    Physical Exam  Constitutional: in NAD Eyes: PERRLA, EOMI, no exophthalmos ENT: moist mucous membranes, no thyromegaly, no cervical lymphadenopathy Cardiovascular: RRR, No MRG Respiratory: CTA B Gastrointestinal: abdomen soft, NT, ND, BS+ Musculoskeletal: no deformities, strength intact in all 4 Skin: moist, warm, no rashes Neurological: no tremor with outstretched hands, DTR normal in all 4   Results for orders placed or performed in visit on 05/14/17  PTH, intact and calcium  Result Value Ref Range   PTH 48 14 - 64 pg/mL   Calcium 9.5 8.6 - 10.4 mg/dL   Complete Blood Count (Most recent): Lab Results  Component Value Date   WBC 4.1 12/11/2016   HGB 12.2 12/11/2016   HCT 38.7 12/11/2016   MCV 83.4 12/11/2016   PLT 279 12/11/2016   Chemistry (most recent): Lab Results  Component Value Date   NA 141 05/14/2017   K 4.8 05/14/2017   CL 105 05/14/2017   CO2 28 05/14/2017   BUN 16 05/14/2017   CREATININE 0.74 05/14/2017   Diabetic Labs (most recent): Lab Results  Component Value Date   HGBA1C 5.5 12/11/2016   HGBA1C 5.6 12/12/2015   HGBA1C 5.6 06/21/2015   Lipid profile (most recent): Lab Results  Component Value Date   TRIG 86 04/17/2017   CHOL 185 04/17/2017     Assessment & Plan:   1. Idiopathic osteoporosis  - Hers is settled , likely senile osteoporosis . T-Score -2.5 in femoral neck and -2.4 in L4. She denies fracture hx. She is tolerating Fosamax weekly. -Repeat DEXA was reported as no status Difference. This is probably a good development for her since she has not lost significant bone density since 2015. I advised her to continue on same. Her PTH is normal along with calcium and TFTs.  Risk of untreated osteoporosis is discussed with her in detail.    2. Vitamin D deficiency - She is now vitamin D replete. I advised her to stop the  weekly vitamin D supplement of 50,000 units. She will continue with the combined calcium/vitamin D supplement.  I advised patient to maintain close follow up with their PCP for primary care needs. Follow up plan: Return in about 1 year (around 05/21/2018) for follow up with pre-visit labs, follow up with Bone Density.  Glade Lloyd, MD Phone: [phone]  Fax: [phone]   05/21/2017, 11:40 AM

## 2017-09-17 ENCOUNTER — Ambulatory Visit (INDEPENDENT_AMBULATORY_CARE_PROVIDER_SITE_OTHER): Payer: PPO | Admitting: Family Medicine

## 2017-09-17 ENCOUNTER — Encounter: Payer: Self-pay | Admitting: Family Medicine

## 2017-09-17 VITALS — BP 110/60 | HR 76 | Temp 97.2°F | Resp 16 | Ht 62.0 in | Wt 113.2 lb

## 2017-09-17 DIAGNOSIS — Z1211 Encounter for screening for malignant neoplasm of colon: Secondary | ICD-10-CM

## 2017-09-17 DIAGNOSIS — Z0000 Encounter for general adult medical examination without abnormal findings: Secondary | ICD-10-CM | POA: Diagnosis not present

## 2017-09-17 DIAGNOSIS — Z23 Encounter for immunization: Secondary | ICD-10-CM

## 2017-09-17 DIAGNOSIS — Z1231 Encounter for screening mammogram for malignant neoplasm of breast: Secondary | ICD-10-CM

## 2017-09-17 LAB — HEMOCCULT GUIAC POC 1CARD (OFFICE): Fecal Occult Blood, POC: NEGATIVE

## 2017-09-17 NOTE — Patient Instructions (Signed)
F/u in 5 months, call if you need me bfore  Please get mammogram scheduled at checkout  CBC and fasting lipid panel 1 week before follow up  Flu vaccine today  Thank you  for choosing [facility]. We consider it a privelige to serve you.  Delivering excellent health care in a caring and  compassionate way is our goal.  Partnering with you,  so that together we can achieve this goal is our strategy.

## 2017-09-18 NOTE — Assessment & Plan Note (Signed)
Annual exam as documented. . Cancer screening needs are specifically addressed at this visit.

## 2017-09-18 NOTE — Progress Notes (Signed)
    Megan Gibbs     MRN: [id number]      DOB: May 03, 1951  HPI: Patient is in for annual physical exam.  Immunization is reviewed , and  updated .   PE: BP 110/60 (BP Location: Right Arm, Patient Position: Sitting, Cuff Size: Normal)   Pulse 76   Temp (!) 97.2 F (36.2 C) (Other (Comment))   Resp 16   Ht 5\' 2"  (1.575 m)   Wt 113 lb 4 oz (51.4 kg)   SpO2 99%   BMI 20.71 kg/m  Pleasant  female, alert and oriented x 3, in no cardio-pulmonary distress. Afebrile. HEENT No facial trauma or asymetry. Sinuses non tender.  Extra occullar muscles intact, pupils equally reactive to light. External ears normal, tympanic membranes clear. Oropharynx moist, no exudate. Neck: supple, no adenopathy,JVD or thyromegaly.No bruits.  Chest: Clear to ascultation bilaterally.No crackles or wheezes. Non tender to palpation  Breast: No asymetry,no masses or lumps. No tenderness. No nipple discharge or inversion. No axillary or supraclavicular adenopathy  Cardiovascular system; Heart sounds normal,  S1 and  S2 ,no S3.  No murmur, or thrill. Apical beat not displaced Peripheral pulses normal.  Abdomen: Soft, non tender, no organomegaly or masses. No bruits. Bowel sounds normal. No guarding, tenderness or rebound.  Rectal:  Normal sphincter tone. No rectal mass. Guaiac negative stool.  GU: Not examined   Musculoskeletal exam: Full ROM of spine, hips , shoulders and knees. No deformity ,swelling or crepitus noted. No muscle wasting or atrophy.   Neurologic: Cranial nerves 2 to 12 intact. Power, tone ,sensation and reflexes normal throughout. No disturbance in gait. No tremor.  Skin: Intact, no ulceration, erythema , scaling or rash noted. Pigmentation normal throughout  Psych; Normal mood and affect. Judgement and concentration normal   Assessment & Plan:  Annual physical exam Annual exam as documented. . Cancer screening needs are specifically addressed at this  visit.

## 2017-10-30 ENCOUNTER — Ambulatory Visit (HOSPITAL_COMMUNITY): Payer: Self-pay

## 2017-10-30 ENCOUNTER — Ambulatory Visit (HOSPITAL_COMMUNITY)
Admission: RE | Admit: 2017-10-30 | Discharge: 2017-10-30 | Disposition: A | Discharge status: Home / Self Care | Payer: PPO | Source: Ambulatory Visit | Attending: Family Medicine | Admitting: Family Medicine

## 2017-10-30 ENCOUNTER — Other Ambulatory Visit: Payer: Self-pay | Admitting: Family Medicine

## 2017-10-30 ENCOUNTER — Encounter (HOSPITAL_COMMUNITY): Payer: Self-pay

## 2017-10-30 DIAGNOSIS — Z1231 Encounter for screening mammogram for malignant neoplasm of breast: Secondary | ICD-10-CM

## 2018-02-07 ENCOUNTER — Telehealth: Payer: Self-pay

## 2018-02-07 DIAGNOSIS — R7303 Prediabetes: Secondary | ICD-10-CM | POA: Diagnosis not present

## 2018-02-07 DIAGNOSIS — Z13220 Encounter for screening for lipoid disorders: Secondary | ICD-10-CM

## 2018-02-07 LAB — CBC
HEMATOCRIT: 36.2 % (ref 35.0–45.0)
Hemoglobin: 11.9 g/dL (ref 11.7–15.5)
MCH: 26.8 pg — ABNORMAL LOW (ref 27.0–33.0)
MCHC: 32.9 g/dL (ref 32.0–36.0)
MCV: 81.5 fL (ref 80.0–100.0)
MPV: 12 fL (ref 7.5–12.5)
Platelets: 247 10*3/uL (ref 140–400)
RBC: 4.44 10*6/uL (ref 3.80–5.10)
RDW: 13 % (ref 11.0–15.0)
WBC: 4.2 10*3/uL (ref 3.8–10.8)

## 2018-02-07 LAB — LIPID PANEL
Cholesterol: 182 mg/dL (ref <200)
HDL: 63 mg/dL (ref >50)
LDL CHOLESTEROL (CALC): 99 mg/dL
NON-HDL CHOLESTEROL (CALC): 119 mg/dL (ref <130)
Total CHOL/HDL Ratio: 2.9 (calc) (ref <5.0)
Triglycerides: 108 mg/dL (ref <150)

## 2018-02-07 LAB — TSH: TSH: 2.83 mIU/L (ref 0.40–4.50)

## 2018-02-07 NOTE — Telephone Encounter (Signed)
Labs orderd

## 2018-02-11 ENCOUNTER — Ambulatory Visit (INDEPENDENT_AMBULATORY_CARE_PROVIDER_SITE_OTHER): Payer: PPO | Admitting: Family Medicine

## 2018-02-11 ENCOUNTER — Encounter: Payer: Self-pay | Admitting: Family Medicine

## 2018-02-11 VITALS — BP 114/74 | HR 74 | Resp 16 | Ht 62.0 in | Wt 111.1 lb

## 2018-02-11 DIAGNOSIS — F411 Generalized anxiety disorder: Secondary | ICD-10-CM

## 2018-02-11 DIAGNOSIS — J3089 Other allergic rhinitis: Secondary | ICD-10-CM | POA: Diagnosis not present

## 2018-02-11 DIAGNOSIS — R634 Abnormal weight loss: Secondary | ICD-10-CM | POA: Diagnosis not present

## 2018-02-11 DIAGNOSIS — M818 Other osteoporosis without current pathological fracture: Secondary | ICD-10-CM

## 2018-02-11 DIAGNOSIS — F409 Phobic anxiety disorder, unspecified: Secondary | ICD-10-CM

## 2018-02-11 DIAGNOSIS — F5105 Insomnia due to other mental disorder: Secondary | ICD-10-CM | POA: Diagnosis not present

## 2018-02-11 MED ORDER — MIRTAZAPINE 7.5 MG PO TABS: 7.5000 mg | ORAL_TABLET | Freq: Every day | ORAL | 3 refills | Status: DC

## 2018-02-11 NOTE — Assessment & Plan Note (Signed)
Currently asymptomatic 

## 2018-02-11 NOTE — Patient Instructions (Signed)
F/u in 2 months, call if you need me  Before  Cholesterol is good  Thankful overall doing better  New medication at night for sleep and to help with appetite  Continue multivitamin,you are slightly anemic

## 2018-02-11 NOTE — Assessment & Plan Note (Signed)
Sleep hygiene reviewed and written information offered also. Prescription sent for  medication needed.  

## 2018-02-11 NOTE — Assessment & Plan Note (Signed)
Start Remeron to help with appetite and weight gain as well as for mental health

## 2018-02-11 NOTE — Assessment & Plan Note (Signed)
Mildly increased with poor sleep and weight loss, resume low dose Remeron F/u in 2 to 3 months

## 2018-02-11 NOTE — Assessment & Plan Note (Signed)
Continue treatment and followed by endo

## 2018-02-11 NOTE — Progress Notes (Signed)
   Megan Gibbs     MRN: [id number]      DOB: [birth year]   HPI Megan Gibbs is here for follow up and re-evaluation of chronic medical conditions, medication management and review of any available recent lab and radiology data.  Preventive health is updated, specifically  Cancer screening and Immunization.   C/o weight loss, poor sleep and increased anxiety  States she is at a [employer], going around with her Megan Gibbs and siblings and on a brand new good course of life  ROS Denies recent fever or chills. Denies sinus pressure, nasal congestion, ear pain or sore throat. Denies chest congestion, productive cough or wheezing. Denies chest pains, palpitations and leg swelling Denies abdominal pain, nausea, vomiting,diarrhea or constipation.   Denies dysuria, frequency, hesitancy or incontinence. Denies joint pain, swelling and limitation in mobility. Denies headaches, seizures, numbness, or tingling. Denies depression,  Denies skin break down or rash.   PE  BP 114/74   Pulse 74   Resp 16   Ht 5\' 2"  (1.575 m)   Wt 111 lb 1.9 oz (50.4 kg)   SpO2 97%   BMI 20.32 kg/m   Patient alert and oriented and in no cardiopulmonary distress.  HEENT: No facial asymmetry, EOMI,   oropharynx pink and moist.  Neck supple no JVD, no mass.  Chest: Clear to auscultation bilaterally.  CVS: S1, S2 no murmurs, no S3.Regular rate.  ABD: Soft non tender.   Ext: No edema  MS: Adequate ROM spine, shoulders, hips and knees.  Skin: Intact, no ulcerations or rash noted.  Psych: Good eye contact, hyperactive and pressure of speech. Memory intact  anxious not  depressed appearing.  CNS: CN 2-12 intact, power,  normal throughout.no focal deficits noted.   Assessment & Plan  Anxiety state Mildly increased with poor sleep and weight loss, resume low dose Remeron F/u in 2 to 3 months  Osteoporosis Continue treatment and followed by endo  Allergic rhinitis Currently asymptomatic  Insomnia due to  anxiety and fear Sleep hygiene reviewed and written information offered also. Prescription sent for  medication needed.   Weight loss Start Remeron to help with appetite and weight gain as well as for mental health

## 2018-05-01 ENCOUNTER — Ambulatory Visit: Payer: Self-pay | Admitting: Family Medicine

## 2018-05-12 ENCOUNTER — Other Ambulatory Visit: Payer: Self-pay | Admitting: "Endocrinology

## 2018-05-12 DIAGNOSIS — E213 Hyperparathyroidism, unspecified: Secondary | ICD-10-CM

## 2018-05-12 DIAGNOSIS — M810 Age-related osteoporosis without current pathological fracture: Secondary | ICD-10-CM

## 2018-05-15 ENCOUNTER — Ambulatory Visit (HOSPITAL_COMMUNITY)
Admission: RE | Admit: 2018-05-15 | Discharge: 2018-05-15 | Disposition: A | Discharge status: Home / Self Care | Payer: PPO | Source: Ambulatory Visit | Attending: "Endocrinology | Admitting: "Endocrinology

## 2018-05-15 DIAGNOSIS — Z780 Asymptomatic menopausal state: Secondary | ICD-10-CM | POA: Diagnosis not present

## 2018-05-15 DIAGNOSIS — M810 Age-related osteoporosis without current pathological fracture: Secondary | ICD-10-CM

## 2018-05-15 DIAGNOSIS — E213 Hyperparathyroidism, unspecified: Secondary | ICD-10-CM | POA: Diagnosis not present

## 2018-05-15 DIAGNOSIS — M8589 Other specified disorders of bone density and structure, multiple sites: Secondary | ICD-10-CM | POA: Diagnosis not present

## 2018-05-16 LAB — COMPREHENSIVE METABOLIC PANEL
AG Ratio: 1.6 (calc) (ref 1.0–2.5)
ALT: 14 U/L (ref 6–29)
AST: 19 U/L (ref 10–35)
Albumin: 4.7 g/dL (ref 3.6–5.1)
Alkaline phosphatase (APISO): 74 U/L (ref 33–130)
BUN: 13 mg/dL (ref 7–25)
CO2: 29 mmol/L (ref 20–32)
CREATININE: 0.76 mg/dL (ref 0.50–0.99)
Calcium: 10.2 mg/dL (ref 8.6–10.4)
Chloride: 106 mmol/L (ref 98–110)
GLOBULIN: 2.9 g/dL (ref 1.9–3.7)
GLUCOSE: 86 mg/dL (ref 65–99)
Potassium: 5 mmol/L (ref 3.5–5.3)
SODIUM: 144 mmol/L (ref 135–146)
TOTAL PROTEIN: 7.6 g/dL (ref 6.1–8.1)
Total Bilirubin: 0.5 mg/dL (ref 0.2–1.2)

## 2018-05-16 LAB — VITAMIN D 25 HYDROXY (VIT D DEFICIENCY, FRACTURES): Vit D, 25-Hydroxy: 55 ng/mL (ref 30–100)

## 2018-05-16 LAB — PTH, INTACT AND CALCIUM
CALCIUM: 10.2 mg/dL (ref 8.6–10.4)
PTH: 56 pg/mL (ref 14–64)

## 2018-05-21 ENCOUNTER — Encounter: Payer: Self-pay | Admitting: "Endocrinology

## 2018-05-21 ENCOUNTER — Ambulatory Visit (INDEPENDENT_AMBULATORY_CARE_PROVIDER_SITE_OTHER): Payer: PPO | Admitting: "Endocrinology

## 2018-05-21 VITALS — BP 109/73 | HR 73 | Ht 62.0 in | Wt 112.0 lb

## 2018-05-21 DIAGNOSIS — M810 Age-related osteoporosis without current pathological fracture: Secondary | ICD-10-CM | POA: Diagnosis not present

## 2018-05-21 MED ORDER — ALENDRONATE SODIUM 70 MG PO TABS: ORAL_TABLET | ORAL | 12 refills | Status: DC

## 2018-05-21 NOTE — Progress Notes (Signed)
Subjective:    Patient ID: Megan Gibbs, female    DOB: [birth year],    Past Medical History:  Diagnosis Date  . Allergic rhinitis   . Anemia   . Anxiety   . Back pain   . Depression   . Hypokalemia   . Osteoporosis   . Prediabetes   . Seasonal allergies   . Vertigo   . Wears glasses    Past Surgical History:  Procedure Laterality Date  . BACK SURGERY  1992 & 2009   Dr. Joya Salm   . BREAST BIOPSY    . BREAST EXCISIONAL BIOPSY    . BREAST LUMPECTOMY WITH RADIOACTIVE SEED LOCALIZATION Right 12/01/2014   Procedure: RIGHT BREAST LUMPECTOMY WITH RADIOACTIVE SEED LOCALIZATION;  Surgeon: Fanny Skates, MD;  Location: [facility];  Service: General;  Laterality: Right;  . COLONOSCOPY N/A 04/19/2014   Procedure: COLONOSCOPY;  Surgeon: Daneil Dolin, MD;  [facility];  Service: Endoscopy;  Laterality: N/A;  9:30 AM  . DILATION AND CURETTAGE OF UTERUS    . TUBAL LIGATION     1976   Social History   Socioeconomic History  . Marital status: Married    Spouse name: Not on file  . Number of children: 3  . Years of education: Not on file  . Highest education level: Not on file  Occupational History  . Occupation: disabled     Fish farm manager: UNEMPLOYED  Social Needs  . Financial resource strain: Not on file  . Food insecurity:    Worry: Not on file    Inability: Not on file  . Transportation needs:    Medical: Not on file    Non-medical: Not on file  Tobacco Use  . Smoking status: Never Smoker  . Smokeless tobacco: Never Used  Substance and Sexual Activity  . Alcohol use: No  . Drug use: No  . Sexual activity: Yes    Birth control/protection: Post-menopausal  Lifestyle  . Physical activity:    Days per week: Not on file    Minutes per session: Not on file  . Stress: Not on file  Relationships  . Social connections:    Talks on phone: Not on file    Gets together: Not on file    Attends religious service: Not on file    Active member of club  or organization: Not on file    Attends meetings of clubs or organizations: Not on file    Relationship status: Not on file  Other Topics Concern  . Not on file  Social History Narrative  . Not on file   Outpatient Encounter Medications as of 05/21/2018  Medication Sig  . alendronate (FOSAMAX) 70 MG tablet TAKE 1 TABLET ONCE A WEEK. TAKE WITH A FULL GLASS OF WATER ON AN EMPTY STOMACH.  [name] aspirin (ASPIRIN LOW DOSE) 81 MG EC tablet Take 81 mg by mouth daily.    . Calcium Carb-Cholecalciferol (CALCIUM 600+D3) 600-200 MG-UNIT TABS Take 1 tablet by mouth daily.  . cetirizine (ZYRTEC) 10 MG chewable tablet Chew 10 mg by mouth daily.  . mirtazapine (REMERON) 7.5 MG tablet Take 1 tablet (7.5 mg total) by mouth at bedtime.  . [DISCONTINUED] alendronate (FOSAMAX) 70 MG tablet TAKE 1 TABLET ONCE A WEEK. TAKE WITH A FULL GLASS OF WATER ON AN EMPTY STOMACH.   No facility-administered encounter medications on file as of 05/21/2018.    ALLERGIES: No Known Allergies VACCINATION STATUS: Immunization History  Administered Date(s) Administered  .  Influenza Split 09/24/2012  . Influenza Whole 10/04/2009, 09/29/2010, 09/17/2011  . Influenza,inj,Quad PF,6+ Mos 10/29/2013, 10/11/2014, 09/20/2015, 08/20/2016, 09/17/2017  . Pneumococcal Conjugate-13 03/17/2015  . Pneumococcal Polysaccharide-23 12/18/2016  . Td 03/24/2010  . Zoster 09/20/2011    HPI  67 yr old female with medical hx as above.  She is returning to follow-up for her osteoporosis.  She was initiated on Fosamax treatment in 2015.  She continues to tolerate this medication.  [name] Her  DXA was in May 2015 which showed T -score of -2.5 on right femoral neck and -2.4 in L4 verteba. she denies any hx of fracture nor loss of height. -Her repeat bone density from a previous 2017 was reported as no statistical difference compared to the one from 2015. -Her most recent bone density from May 2019 is significantly better than the one from 2011 She has had  lumbar disc prolapse for which she underwent surgery in 2002 and 2009. She denies weight loss. she has had menses until her early 83's. has 3 grown children. she denies parathyroid, thyroid dysfunction.  She denies history of fragility fracture.  Review of Systems Constitutional:  She has a steady weight,   no fatigue, no subjective hyperthermia/hypothermia Eyes: no blurry vision, no xerophthalmia ENT: no sore throat, no nodules palpated in throat, no dysphagia/odynophagia, no hoarseness Cardiovascular: no chest pain, no palpitations.   Respiratory: no cough/SOB Gastrointestinal: no N/V/D/C Musculoskeletal: no muscle/joint aches Skin: no rashes Neurological: no tremors/numbness/tingling/dizziness Psychiatric: no depression/anxiety   Objective:    BP 109/73   Pulse 73   Ht 5\' 2"  (1.575 m)   Wt 112 lb (50.8 kg)   BMI 20.49 kg/m   Wt Readings from Last 3 Encounters:  05/21/18 112 lb (50.8 kg)  02/11/18 111 lb 1.9 oz (50.4 kg)  09/17/17 113 lb 4 oz (51.4 kg)    Physical Exam  Constitutional: Alert and oriented x3, not in acute distress.   Eyes: PERRLA, EOMI, no exophthalmos ENT: moist mucous membranes, no thyromegaly, no cervical lymphadenopathy Musculoskeletal: no deformities, strength intact in all 4 Skin: moist, warm, no rashes Neurological: no tremor with outstretched hands, DTR normal in all 4   Results for orders placed or performed in visit on 05/12/18  Comprehensive metabolic panel  Result Value Ref Range   Glucose, Bld 86 65 - 99 mg/dL   BUN 13 7 - 25 mg/dL   Creat 0.76 0.50 - 0.99 mg/dL   BUN/Creatinine Ratio NOT APPLICABLE 6 - 22 (calc)   Sodium 144 135 - 146 mmol/L   Potassium 5.0 3.5 - 5.3 mmol/L   Chloride 106 98 - 110 mmol/L   CO2 29 20 - 32 mmol/L   Calcium 10.2 8.6 - 10.4 mg/dL   Total Protein 7.6 6.1 - 8.1 g/dL   Albumin 4.7 3.6 - 5.1 g/dL   Globulin 2.9 1.9 - 3.7 g/dL (calc)   AG Ratio 1.6 1.0 - 2.5 (calc)   Total Bilirubin 0.5 0.2 - 1.2 mg/dL    Alkaline phosphatase (APISO) 74 33 - 130 U/L   AST 19 10 - 35 U/L   ALT 14 6 - 29 U/L  Vitamin D, 25-hydroxy  Result Value Ref Range   Vit D, 25-Hydroxy 55 30 - 100 ng/mL  PTH, Intact and Calcium  Result Value Ref Range   PTH 56 14 - 64 pg/mL   Calcium 10.2 8.6 - 10.4 mg/dL   Complete Blood Count (Most recent): Lab Results  Component Value Date   WBC 4.2  02/07/2018   HGB 11.9 02/07/2018   HCT 36.2 02/07/2018   MCV 81.5 02/07/2018   PLT 247 02/07/2018   Chemistry (most recent): Lab Results  Component Value Date   NA 144 05/15/2018   K 5.0 05/15/2018   CL 106 05/15/2018   CO2 29 05/15/2018   BUN 13 05/15/2018   CREATININE 0.76 05/15/2018   Diabetic Labs (most recent): Lab Results  Component Value Date   HGBA1C 5.5 12/11/2016   HGBA1C 5.6 12/12/2015   HGBA1C 5.6 06/21/2015   Lipid profile (most recent): Lab Results  Component Value Date   TRIG 108 02/07/2018   CHOL 182 02/07/2018     Assessment & Plan:   1. Idiopathic osteoporosis  - Hers is settled , likely senile osteoporosis .  DEXA in 2015 T-Score -2.5 in femoral neck and -2.4 in L4. She denies fracture hx. She is tolerating Fosamax weekly. -Repeat DEXA from 2017 in 2019 was reported as no status Difference, although generally improving from 2011/2015 30s.   This is  a good development for her since she has not lost significant bone density since 2015. I advised her to continue on same treatment program of Fosamax 70 mg weekly. Her PTH is normal along with calcium and TFTs, ruling out secondary cause of osteoporosis. She is made aware of the possibility of treating her with Fosamax for 5 to 8 years before considering drug holiday. Risk of untreated osteoporosis is discussed with her in detail.    2. Vitamin D deficiency - She is now vitamin D replete. I advised her to stop the weekly vitamin D supplement of 50,000 units. She will continue with the combined calcium/vitamin D supplement.  I advised patient  to maintain close follow up with their PCP for primary care needs. Follow up plan: Return in about 1 year (around 05/22/2019) for follow up with pre-visit labs.  Glade Lloyd, MD Phone: [phone]  Fax: [phone]  -  This note was partially dictated with voice recognition software. Similar sounding words can be transcribed inadequately or may not  be corrected upon review.  05/21/2018, 1:31 PM

## 2018-06-10 ENCOUNTER — Ambulatory Visit: Payer: Self-pay | Admitting: Family Medicine

## 2018-06-18 ENCOUNTER — Ambulatory Visit (INDEPENDENT_AMBULATORY_CARE_PROVIDER_SITE_OTHER): Payer: PPO

## 2018-06-18 VITALS — BP 114/64 | HR 64 | Temp 98.0°F | Resp 12 | Ht 62.0 in | Wt 110.1 lb

## 2018-06-18 DIAGNOSIS — Z0000 Encounter for general adult medical examination without abnormal findings: Secondary | ICD-10-CM

## 2018-06-18 NOTE — Progress Notes (Signed)
Subjective:   Megan Gibbs is a 67 y.o. female who presents for Medicare Annual (Subsequent) preventive examination.  Review of Systems:         Objective:     Vitals: There were no vitals taken for this visit.  There is no height or weight on file to calculate BMI.  Advanced Directives 05/09/2017 12/30/2016 05/13/2015 12/01/2014 11/24/2014 05/03/2014 04/19/2014  Does Patient Have a Medical Advance Directive? No No No No No Patient does not have advance directive Patient does not have advance directive;Patient would like information  Would patient like information on creating a medical advance directive? Yes (MAU/Ambulatory/Procedural Areas - Information given) No - Patient declined No - patient declined information No - patient declined information No - patient declined information - Advance directive packet given  Pre-existing out of facility DNR order (yellow form or pink MOST form) - - - - - - No    Tobacco Social History   Tobacco Use  Smoking Status Never Smoker  Smokeless Tobacco Never Used     Counseling given: Not Answered   Clinical Intake:                       Past Medical History:  Diagnosis Date  . Allergic rhinitis   . Anemia   . Anxiety   . Back pain   . Depression   . Hypokalemia   . Osteoporosis   . Prediabetes   . Seasonal allergies   . Vertigo   . Wears glasses    Past Surgical History:  Procedure Laterality Date  . BACK SURGERY  1992 & 2009   Dr. Joya Salm   . BREAST BIOPSY    . BREAST EXCISIONAL BIOPSY    . BREAST LUMPECTOMY WITH RADIOACTIVE SEED LOCALIZATION Right 12/01/2014   Procedure: RIGHT BREAST LUMPECTOMY WITH RADIOACTIVE SEED LOCALIZATION;  Surgeon: Fanny Skates, MD;  Location: [facility];  Service: General;  Laterality: Right;  . COLONOSCOPY N/A 04/19/2014   Procedure: COLONOSCOPY;  Surgeon: Daneil Dolin, MD;  [facility];  Service: Endoscopy;  Laterality: N/A;  9:30 AM  . DILATION AND CURETTAGE  OF UTERUS    . TUBAL LIGATION     1976   Family History  Problem Relation Age of Onset  . Diabetes Mother   . Hypertension Mother   . Asthma Sister   . Thyroid disease Sister   . COPD Sister   . Anemia Sister   . Diabetes Sister   . Hypertension Sister   . Diabetes Sister   . Schizophrenia Father   . Drug abuse Brother   . Alcohol abuse Brother   . Sleep apnea Grandchild   . ADD / ADHD Grandchild   . Seizures Grandchild   . Breast cancer Sister 39  . Thyroid disease Sister   . Hypertension Brother   . Anxiety disorder Maternal Aunt   . Hypertension Daughter   . Anemia Daughter   . Hypertension Daughter   . Asthma Daughter   . Colon cancer Neg Hx    Social History   Socioeconomic History  . Marital status: Married    Spouse name: Not on file  . Number of children: 3  . Years of education: Not on file  . Highest education level: Not on file  Occupational History  . Occupation: disabled     Fish farm manager: UNEMPLOYED  Social Needs  . Financial resource strain: Not on file  . Food insecurity:  Worry: Not on file    Inability: Not on file  . Transportation needs:    Medical: Not on file    Non-medical: Not on file  Tobacco Use  . Smoking status: Never Smoker  . Smokeless tobacco: Never Used  Substance and Sexual Activity  . Alcohol use: No  . Drug use: No  . Sexual activity: Yes    Birth control/protection: Post-menopausal  Lifestyle  . Physical activity:    Days per week: Not on file    Minutes per session: Not on file  . Stress: Not on file  Relationships  . Social connections:    Talks on phone: Not on file    Gets together: Not on file    Attends religious service: Not on file    Active member of club or organization: Not on file    Attends meetings of clubs or organizations: Not on file    Relationship status: Not on file  Other Topics Concern  . Not on file  Social History Narrative  . Not on file    Outpatient Encounter Medications as of  06/18/2018  Medication Sig  . alendronate (FOSAMAX) 70 MG tablet TAKE 1 TABLET ONCE A WEEK. TAKE WITH A FULL GLASS OF WATER ON AN EMPTY STOMACH.  [name] aspirin (ASPIRIN LOW DOSE) 81 MG EC tablet Take 81 mg by mouth daily.    . Calcium Carb-Cholecalciferol (CALCIUM 600+D3) 600-200 MG-UNIT TABS Take 1 tablet by mouth daily.  . cetirizine (ZYRTEC) 10 MG chewable tablet Chew 10 mg by mouth daily.  . mirtazapine (REMERON) 7.5 MG tablet Take 1 tablet (7.5 mg total) by mouth at bedtime.   No facility-administered encounter medications on file as of 06/18/2018.     Activities of Daily Living No flowsheet data found.  Patient Care Team: Fayrene Helper, MD as PCP - General Rourk, Cristopher Estimable, MD as Consulting Physician (Gastroenterology) Lay, Bobbye Charleston, MD (Psychiatry) Cassandria Anger, MD as Consulting Physician (Endocrinology) Madelin Headings, DO as Consulting Physician (Optometry)    Assessment:   This is a routine wellness examination for [facility].  Exercise Activities and Dietary recommendations    Goals    None      Fall Risk Fall Risk  05/21/2018 02/11/2018 09/17/2017 05/21/2017 05/09/2017  Falls in the past year? No No No No No   Is the patient's home free of loose throw rugs in walkways, pet beds, electrical cords, etc?   no      Grab bars in the bathroom? no      Handrails on the stairs?   yes      Adequate lighting?   yes  Timed Get Up and Go performed: NO  Depression Screen PHQ 2/9 Scores 05/21/2018 02/11/2018 09/17/2017 05/21/2017  PHQ - 2 Score 0 0 0 0  PHQ- 9 Score - - - -     Cognitive Function     6CIT Screen 05/09/2017  What Year? 0 points  What month? 0 points  What time? 0 points  Count back from 20 0 points  Months in reverse 0 points  Repeat phrase 0 points  Total Score 0    Immunization History  Administered Date(s) Administered  . Influenza Split 09/24/2012  . Influenza Whole 10/04/2009, 09/29/2010, 09/17/2011  . Influenza,inj,Quad PF,6+ Mos  10/29/2013, 10/11/2014, 09/20/2015, 08/20/2016, 09/17/2017  . Pneumococcal Conjugate-13 03/17/2015  . Pneumococcal Polysaccharide-23 12/18/2016  . Td 03/24/2010  . Zoster 09/20/2011    Qualifies for Shingles Vaccine?UTD  Screening Tests Health  Maintenance  Topic Date Due  . INFLUENZA VACCINE  07/31/2018  . MAMMOGRAM  10/31/2019  . TETANUS/TDAP  03/24/2020  . COLONOSCOPY  04/19/2024  . DEXA SCAN  Completed  . Hepatitis C Screening  Completed  . PNA vac Low Risk Adult  Completed    Cancer Screenings: Lung: Low Dose CT Chest recommended if Age 79-80 years, 30 pack-year currently smoking OR have quit w/in 15years. Patient does not qualify. Breast:  Up to date on Mammogram? Yes   Up to date of Bone Density/Dexa? Yes Colorectal: UTD  Additional Screenings:  Hepatitis C Screening: COMPLETED ON 12/12/2015     Plan:      I have personally reviewed and noted the following in the patient's chart:   . Medical and social history . Use of alcohol, tobacco or illicit drugs  . Current medications and supplements . Functional ability and status . Nutritional status . Physical activity . Advanced directives . List of other physicians . Hospitalizations, surgeries, and ER visits in previous 12 months . Vitals . Screenings to include cognitive, depression, and falls . Referrals and appointments  In addition, I have reviewed and discussed with patient certain preventive protocols, quality metrics, and best practice recommendations. A written personalized care plan for preventive services as well as general preventive health recommendations were provided to patient.     Tod Persia, [place]  06/18/2018

## 2018-06-18 NOTE — Patient Instructions (Signed)
Megan Gibbs , Thank you for taking time to come for your Medicare Wellness Visit. I appreciate your ongoing commitment to your health goals. Please review the following plan we discussed and let me know if I can assist you in the future.   Screening recommendations/referrals: Colonoscopy: UTD Mammogram: UTD Bone Density: UTD Recommended yearly ophthalmology/optometry visit for glaucoma screening and checkup Recommended yearly dental visit for hygiene and checkup  Vaccinations: Influenza vaccine: DUE FALL 2019 Pneumococcal vaccine: UTD Tdap vaccine: UTD Shingles vaccine: UTD    Advanced directives: YOU ALREADY HAVE THE PAPERWORK FOR THIS  Conditions/risks identified: WE DISCUSSED THESE DURING YOUR VISIT   Next appointment: July 10, 2018 AT 10:20 AM WITH DR.SIMPSON. IF YOU NEED [place] BEFORE THEN, PLEASE CALL.    Preventive Care 67 Years and Older, Female Preventive care refers to lifestyle choices and visits with your health care provider that can promote health and wellness. What does preventive care include?  A yearly physical exam. This is also called an annual well check.  Dental exams once or twice a year.  Routine eye exams. Ask your health care provider how often you should have your eyes checked.  Personal lifestyle choices, including:  Daily care of your teeth and gums.  Regular physical activity.  Eating a healthy diet.  Avoiding tobacco and drug use.  Limiting alcohol use.  Practicing safe sex.  Taking low-dose aspirin every day.  Taking vitamin and mineral supplements as recommended by your health care provider. What happens during an annual well check? The services and screenings done by your health care provider during your annual well check will depend on your age, overall health, lifestyle risk factors, and family history of disease. Counseling  Your health care provider may ask you questions about your:  Alcohol use.  Tobacco use.  Drug  use.  Emotional well-being.  Home and relationship well-being.  Sexual activity.  Eating habits.  History of falls.  Memory and ability to understand (cognition).  Work and work Statistician.  Reproductive health. Screening  You may have the following tests or measurements:  Height, weight, and BMI.  Blood pressure.  Lipid and cholesterol levels. These may be checked every 5 years, or more frequently if you are over 21 years old.  Skin check.  Lung cancer screening. You may have this screening every year starting at age 43 if you have a 30-pack-year history of smoking and currently smoke or have quit within the past 15 years.  Fecal occult blood test (FOBT) of the stool. You may have this test every year starting at age 44.  Flexible sigmoidoscopy or colonoscopy. You may have a sigmoidoscopy every 5 years or a colonoscopy every 10 years starting at age 66.  Hepatitis C blood test.  Hepatitis B blood test.  Sexually transmitted disease (STD) testing.  Diabetes screening. This is done by checking your blood sugar (glucose) after you have not eaten for a while (fasting). You may have this done every 1-3 years.  Bone density scan. This is done to screen for osteoporosis. You may have this done starting at age 42.  Mammogram. This may be done every 1-2 years. Talk to your health care provider about how often you should have regular mammograms. Talk with your health care provider about your test results, treatment options, and if necessary, the need for more tests. Vaccines  Your health care provider may recommend certain vaccines, such as:  Influenza vaccine. This is recommended every year.  Tetanus, diphtheria, and acellular  pertussis (Tdap, Td) vaccine. You may need a Td booster every 10 years.  Zoster vaccine. You may need this after age 90.  Pneumococcal 13-valent conjugate (PCV13) vaccine. One dose is recommended after age 84.  Pneumococcal polysaccharide  (PPSV23) vaccine. One dose is recommended after age 70. Talk to your health care provider about which screenings and vaccines you need and how often you need them. This information is not intended to replace advice given to you by your health care provider. Make sure you discuss any questions you have with your health care provider. Document Released: 01/13/2016 Document Revised: 09/05/2016 Document Reviewed: 10/18/2015 Elsevier Interactive Patient Education  2017 [employer] Prevention in the Home Falls can cause injuries. They can happen to people of all ages. There are many things you can do to make your home safe and to help prevent falls. What can I do on the outside of my home?  Regularly fix the edges of walkways and driveways and fix any cracks.  Remove anything that might make you trip as you walk through a door, such as a raised step or threshold.  Trim any bushes or trees on the path to your home.  Use bright outdoor lighting.  Clear any walking paths of anything that might make someone trip, such as rocks or tools.  Regularly check to see if handrails are loose or broken. Make sure that both sides of any steps have handrails.  Any raised decks and porches should have guardrails on the edges.  Have any leaves, snow, or ice cleared regularly.  Use sand or salt on walking paths during winter.  Clean up any spills in your garage right away. This includes oil or grease spills. What can I do in the bathroom?  Use night lights.  Install grab bars by the toilet and in the tub and shower. Do not use towel bars as grab bars.  Use non-skid mats or decals in the tub or shower.  If you need to sit down in the shower, use a plastic, non-slip stool.  Keep the floor dry. Clean up any water that spills on the floor as soon as it happens.  Remove soap buildup in the tub or shower regularly.  Attach bath mats securely with double-sided non-slip rug tape.  Do not have  throw rugs and other things on the floor that can make you trip. What can I do in the bedroom?  Use night lights.  Make sure that you have a light by your bed that is easy to reach.  Do not use any sheets or blankets that are too big for your bed. They should not hang down onto the floor.  Have a firm chair that has side arms. You can use this for support while you get dressed.  Do not have throw rugs and other things on the floor that can make you trip. What can I do in the kitchen?  Clean up any spills right away.  Avoid walking on wet floors.  Keep items that you use a lot in easy-to-reach places.  If you need to reach something above you, use a strong step stool that has a grab bar.  Keep electrical cords out of the way.  Do not use floor polish or wax that makes floors slippery. If you must use wax, use non-skid floor wax.  Do not have throw rugs and other things on the floor that can make you trip. What can I do with my stairs?  Do not leave any items on the stairs.  Make sure that there are handrails on both sides of the stairs and use them. Fix handrails that are broken or loose. Make sure that handrails are as long as the stairways.  Check any carpeting to make sure that it is firmly attached to the stairs. Fix any carpet that is loose or worn.  Avoid having throw rugs at the top or bottom of the stairs. If you do have throw rugs, attach them to the floor with carpet tape.  Make sure that you have a light switch at the top of the stairs and the bottom of the stairs. If you do not have them, ask someone to add them for you. What else can I do to help prevent falls?  Wear shoes that:  Do not have high heels.  Have rubber bottoms.  Are comfortable and fit you well.  Are closed at the toe. Do not wear sandals.  If you use a stepladder:  Make sure that it is fully opened. Do not climb a closed stepladder.  Make sure that both sides of the stepladder are  locked into place.  Ask someone to hold it for you, if possible.  Clearly mark and make sure that you can see:  Any grab bars or handrails.  First and last steps.  Where the edge of each step is.  Use tools that help you move around (mobility aids) if they are needed. These include:  Canes.  Walkers.  Scooters.  Crutches.  Turn on the lights when you go into a dark area. Replace any light bulbs as soon as they burn out.  Set up your furniture so you have a clear path. Avoid moving your furniture around.  If any of your floors are uneven, fix them.  If there are any pets around you, be aware of where they are.  Review your medicines with your doctor. Some medicines can make you feel dizzy. This can increase your chance of falling. Ask your doctor what other things that you can do to help prevent falls. This information is not intended to replace advice given to you by your health care provider. Make sure you discuss any questions you have with your health care provider. Document Released: 10/13/2009 Document Revised: 05/24/2016 Document Reviewed: 01/21/2015 Elsevier Interactive Patient Education  2017 [employer].

## 2018-07-10 ENCOUNTER — Ambulatory Visit (INDEPENDENT_AMBULATORY_CARE_PROVIDER_SITE_OTHER): Payer: PPO | Admitting: Family Medicine

## 2018-07-10 ENCOUNTER — Encounter: Payer: Self-pay | Admitting: Family Medicine

## 2018-07-10 VITALS — BP 114/72 | HR 66 | Resp 16 | Ht 62.0 in | Wt 111.0 lb

## 2018-07-10 DIAGNOSIS — M818 Other osteoporosis without current pathological fracture: Secondary | ICD-10-CM | POA: Diagnosis not present

## 2018-07-10 DIAGNOSIS — Z1231 Encounter for screening mammogram for malignant neoplasm of breast: Secondary | ICD-10-CM | POA: Diagnosis not present

## 2018-07-10 DIAGNOSIS — R634 Abnormal weight loss: Secondary | ICD-10-CM | POA: Diagnosis not present

## 2018-07-10 DIAGNOSIS — F411 Generalized anxiety disorder: Secondary | ICD-10-CM

## 2018-07-10 DIAGNOSIS — F5105 Insomnia due to other mental disorder: Secondary | ICD-10-CM

## 2018-07-10 DIAGNOSIS — F409 Phobic anxiety disorder, unspecified: Secondary | ICD-10-CM

## 2018-07-10 NOTE — Patient Instructions (Addendum)
Physical exam with MD early November, call if you need me before, will get flu vaccine at that visit  Please schedule screening mammogram due November 1 or after at [place] may stop the aspirin , you do not need it  You are a healthy weight, just eat regularly, do NOT worry about the number 115, you do not even need to focus ion the name BOOST!!   All of your labs are excellent

## 2018-07-10 NOTE — Progress Notes (Signed)
   Megan Gibbs     MRN: [id number]      DOB: 1951/11/11   HPI Megan Gibbs is here for follow up and re-evaluation of medication she was recently started on for sleep and appetite. States she could not tolerate  The Remeron, it made her upset and snapping at her children and she was crying excessively. She is now taking Eldertonic and has a good appetite and also snacks between her meals  She is fixated on a weight of 115 pounds but is a healthy weight currrently, she reports moving to a more comfortable environment, so will review in a few months ROS Denies recent fever or chills. Denies sinus pressure, nasal congestion, ear pain or sore throat. Denies chest congestion, productive cough or wheezing. Denies chest pains, palpitations and leg swelling Denies abdominal pain, nausea, vomiting,diarrhea or constipation.   Denies dysuria, frequency, hesitancy or incontinence. Denies joint pain, swelling and limitation in mobility. Denies headaches, seizures, numbness, or tingling. Denies depression, anxiety or insomnia. Denies skin break down or rash.   PE  BP 114/72   Pulse 66   Resp 16   Ht 5\' 2"  (1.575 m)   Wt 111 lb (50.3 kg)   SpO2 99%   BMI 20.30 kg/m   Patient alert and oriented and in no cardiopulmonary distress.  HEENT: No facial asymmetry, EOMI,   oropharynx pink and moist.  Neck supple no JVD, no mass.  Chest: Clear to auscultation bilaterally.  CVS: S1, S2 no murmurs, no S3.Regular rate.  ABD: Soft non tender.   Ext: No edema  MS: Adequate ROM spine, shoulders, hips and knees.  Skin: Intact, no ulcerations or rash noted.  Psych: Good eye contact, normal affect. Memory intact not anxious or depressed appearing.Does have pressures of speech and anxiety, denies hallucinations , both auditory and visual, neither suicidal nor homicidal  CNS: CN 2-12 intact, power,  normal throughout.no focal deficits noted.   Assessment & Plan  Osteoporosis Continue current  treatment, patient is monitored by endocrinology  Anxiety state Stable, does have pressure of speech, reports improvement in symptoms with her change in living environment however, reports that remeron prescribed made her more anxious so she discontinued the medication  Insomnia due to anxiety and fear Resolved without the use of prescriptions medication which patient could not tolerate, states the change of address was sufficient  Weight loss curently using OTC tonic "eldertonic" 100 cc daily  With reportedly satisfactory results although she has aweigh gain which is not concerning as her BMI is within a healthy range and she has not lost weight

## 2018-07-11 ENCOUNTER — Encounter: Payer: Self-pay | Admitting: Family Medicine

## 2018-07-11 NOTE — Assessment & Plan Note (Signed)
Continue current treatment, patient is monitored by endocrinology

## 2018-07-11 NOTE — Assessment & Plan Note (Signed)
Stable, does have pressure of speech, reports improvement in symptoms with her change in living environment however, reports that remeron prescribed made her more anxious so she discontinued the medication

## 2018-07-11 NOTE — Assessment & Plan Note (Signed)
Resolved without the use of prescriptions medication which patient could not tolerate, states the change of address was sufficient

## 2018-07-11 NOTE — Assessment & Plan Note (Signed)
curently using OTC tonic "eldertonic" 100 cc daily  With reportedly satisfactory results although she has aweigh gain which is not concerning as her BMI is within a healthy range and she has not lost weight

## 2018-09-18 ENCOUNTER — Ambulatory Visit (INDEPENDENT_AMBULATORY_CARE_PROVIDER_SITE_OTHER): Payer: PPO

## 2018-09-18 DIAGNOSIS — Z23 Encounter for immunization: Secondary | ICD-10-CM

## 2018-10-29 ENCOUNTER — Encounter: Payer: Self-pay | Admitting: Family Medicine

## 2018-10-29 ENCOUNTER — Ambulatory Visit (INDEPENDENT_AMBULATORY_CARE_PROVIDER_SITE_OTHER): Payer: PPO | Admitting: Family Medicine

## 2018-10-29 VITALS — BP 110/70 | HR 81 | Resp 15 | Ht 62.0 in | Wt 110.0 lb

## 2018-10-29 DIAGNOSIS — Z1211 Encounter for screening for malignant neoplasm of colon: Secondary | ICD-10-CM | POA: Diagnosis not present

## 2018-10-29 DIAGNOSIS — Z13220 Encounter for screening for lipoid disorders: Secondary | ICD-10-CM

## 2018-10-29 DIAGNOSIS — Z0000 Encounter for general adult medical examination without abnormal findings: Secondary | ICD-10-CM

## 2018-10-29 NOTE — Assessment & Plan Note (Signed)

## 2018-10-29 NOTE — Addendum Note (Signed)
Addended by: Marion Downer A on: 10/29/2018 09:35 AM   Modules accepted: Orders

## 2018-10-29 NOTE — Progress Notes (Signed)
    Megan Gibbs     MRN: [id number]      DOB: 04/04/1951  HPI: Patient is in for annual physical exam. No other health concerns are expressed or addressed at the visit.  Immunization is reviewed , and  updated if needed.   PE: BP 110/70   Pulse 81   Resp 15   Ht 5\' 2"  (1.575 m)   Wt 110 lb (49.9 kg)   SpO2 99%   BMI 20.12 kg/m   Pleasant  female, alert and oriented x 3, in no cardio-pulmonary distress. Afebrile. HEENT No facial trauma or asymetry. Sinuses non tender.  Extra occullar muscles intact, pupils equally reactive to light. External ears normal, tympanic membranes clear. Oropharynx moist, no exudate. Neck: supple, no adenopathy,JVD or thyromegaly.No bruits.  Chest: Clear to ascultation bilaterally.No crackles or wheezes. Non tender to palpation  Breast: No asymetry,no masses or lumps. No tenderness. No nipple discharge or inversion. No axillary or supraclavicular adenopathy  Cardiovascular system; Heart sounds normal,  S1 and  S2 ,no S3.  No murmur, or thrill. Apical beat not displaced Peripheral pulses normal.  Abdomen: Soft, non tender, no organomegaly or masses. No bruits. Bowel sounds normal. No guarding, tenderness or rebound.  .  MN:OTRRNHAF: no visible lesions, physiologic  discharge present  Internal : no warts or ulcers in vaginal canal. Uterius normal sive, no adnexal mass or tenderness. Physiologic discharge   Musculoskeletal exam: Full ROM of spine, hips , shoulders and knees. No deformity ,swelling or crepitus noted. No muscle wasting or atrophy.   Neurologic: Cranial nerves 2 to 12 intact. Power, tone ,sensation and reflexes normal throughout. No disturbance in gait. No tremor.  Skin: Intact, no ulceration, erythema , scaling or rash noted. Pigmentation normal throughout  Psych; Normal mood and affect. Judgement and concentration normal   Assessment & Plan:  Annual physical exam Annual exam as documented. Counseling done   re healthy lifestyle involving commitment to 150 minutes exercise per week, heart healthy diet, and attaining healthy weight.The importance of adequate sleep also discussed. Regular seat belt use and home safety, is also discussed. Changes in health habits are decided on by the patient with goals and time frames  set for achieving them. Immunization and cancer screening needs are specifically addressed at this visit.

## 2018-10-29 NOTE — Patient Instructions (Addendum)
F/U in 5 months, call if you need me before  Wellness with nurse end August/ early September  Exam today is very good  Please get mammogram as scheduled  Cologuard to be set up send specimen form home  Pelvic exam is good  Fastin cBc, lipid, cmp and EGFR 1 week before next visit  Thank you  for choosing [facility]. We consider it a privelige to serve you.  Delivering excellent health care in a caring and  compassionate way is our goal.  Partnering with you,  so that together we can achieve this goal is our strategy.

## 2018-11-03 ENCOUNTER — Ambulatory Visit (HOSPITAL_COMMUNITY)
Admission: RE | Admit: 2018-11-03 | Discharge: 2018-11-03 | Disposition: A | Discharge status: Home / Self Care | Payer: PPO | Source: Ambulatory Visit | Attending: Family Medicine | Admitting: Family Medicine

## 2018-11-03 DIAGNOSIS — Z1231 Encounter for screening mammogram for malignant neoplasm of breast: Secondary | ICD-10-CM | POA: Insufficient documentation

## 2018-11-04 ENCOUNTER — Encounter: Payer: Self-pay | Admitting: Family Medicine

## 2019-03-23 DIAGNOSIS — Z0000 Encounter for general adult medical examination without abnormal findings: Secondary | ICD-10-CM | POA: Diagnosis not present

## 2019-03-23 DIAGNOSIS — Z13220 Encounter for screening for lipoid disorders: Secondary | ICD-10-CM | POA: Diagnosis not present

## 2019-03-23 LAB — CBC
HCT: 37 % (ref 35.0–45.0)
Hemoglobin: 11.9 g/dL (ref 11.7–15.5)
MCH: 26.7 pg — AB (ref 27.0–33.0)
MCHC: 32.2 g/dL (ref 32.0–36.0)
MCV: 83.1 fL (ref 80.0–100.0)
MPV: 11.9 fL (ref 7.5–12.5)
PLATELETS: 258 10*3/uL (ref 140–400)
RBC: 4.45 10*6/uL (ref 3.80–5.10)
RDW: 12.9 % (ref 11.0–15.0)
WBC: 4.7 10*3/uL (ref 3.8–10.8)

## 2019-03-23 LAB — COMPLETE METABOLIC PANEL WITH GFR
AG Ratio: 2.2 (calc) (ref 1.0–2.5)
ALBUMIN MSPROF: 4.8 g/dL (ref 3.6–5.1)
ALKALINE PHOSPHATASE (APISO): 82 U/L (ref 37–153)
ALT: 12 U/L (ref 6–29)
AST: 16 U/L (ref 10–35)
BUN: 15 mg/dL (ref 7–25)
CHLORIDE: 105 mmol/L (ref 98–110)
CO2: 27 mmol/L (ref 20–32)
CREATININE: 0.76 mg/dL (ref 0.50–0.99)
Calcium: 9.7 mg/dL (ref 8.6–10.4)
GFR, Est African American: 94 mL/min/{1.73_m2} (ref >=60)
GFR, Est Non African American: 81 mL/min/{1.73_m2} (ref >=60)
Globulin: 2.2 g/dL (calc) (ref 1.9–3.7)
Glucose, Bld: 84 mg/dL (ref 65–99)
POTASSIUM: 4.7 mmol/L (ref 3.5–5.3)
Sodium: 140 mmol/L (ref 135–146)
Total Bilirubin: 0.4 mg/dL (ref 0.2–1.2)
Total Protein: 7 g/dL (ref 6.1–8.1)

## 2019-03-23 LAB — LIPID PANEL
CHOL/HDL RATIO: 3.1 (calc) (ref <5.0)
CHOLESTEROL: 199 mg/dL (ref <200)
HDL: 65 mg/dL (ref >=50)
LDL CHOLESTEROL (CALC): 113 mg/dL — AB
Non-HDL Cholesterol (Calc): 134 mg/dL (calc) — ABNORMAL HIGH (ref <130)
Triglycerides: 103 mg/dL (ref <150)

## 2019-03-30 ENCOUNTER — Encounter: Payer: Self-pay | Admitting: Family Medicine

## 2019-03-30 ENCOUNTER — Ambulatory Visit (INDEPENDENT_AMBULATORY_CARE_PROVIDER_SITE_OTHER): Payer: PPO | Admitting: Family Medicine

## 2019-03-30 DIAGNOSIS — J3089 Other allergic rhinitis: Secondary | ICD-10-CM

## 2019-03-30 DIAGNOSIS — E785 Hyperlipidemia, unspecified: Secondary | ICD-10-CM

## 2019-03-30 DIAGNOSIS — M818 Other osteoporosis without current pathological fracture: Secondary | ICD-10-CM | POA: Diagnosis not present

## 2019-03-30 DIAGNOSIS — E782 Mixed hyperlipidemia: Secondary | ICD-10-CM | POA: Insufficient documentation

## 2019-03-30 NOTE — Patient Instructions (Signed)
Wellness  visit due early July  Physical exam with MD in early Septemebr, call if you need me before   [place] BAD CHOLESTEROL HAS INCREASED  It is important that you exercise regularly at least 30 minutes 5 times a week. If you develop chest pain, have severe difficulty breathing, or feel very tired, stop exercising immediately and seek medical attention    Social distancing. Frequent hand washing with soap and water Keeping your hands off of your face. These 3 practices will help to keep both you and your community healthy during this time. Please practice them faithfully!  Thank you  for choosing [facility]. We consider it a privelige to serve you.  Delivering excellent health care in a caring and  compassionate way is our goal.  Partnering with you,  so that together we can achieve this goal is our strategy.

## 2019-03-30 NOTE — Assessment & Plan Note (Signed)
Controlled, no change in medication  

## 2019-03-30 NOTE — Progress Notes (Signed)
Virtual Visit via Telephone Note  I connected with Megan Gibbs on 03/30/19 at  9:00 AM EDT by telephone and verified that I am speaking with the correct person using two identifiers. Pt in her home, and I am in the office.   I discussed the limitations, risks, security and privacy concerns of performing an evaluation and management service by telephone and the availability of in person appointments. I also discussed with the patient that there may be a patient responsible charge related to this service. The patient expressed understanding and agreed to proceed.   History of Present Illness: Denies recent fever or chills. Denies sinus pressure, nasal congestion, ear pain or sore throat. Denies chest congestion, productive cough or wheezing. Denies chest pains, palpitations and leg swelling Denies abdominal pain, nausea, vomiting,diarrhea or constipation.   Denies dysuria, frequency, hesitancy or incontinence. Denies joint pain, swelling and limitation in mobility. Denies headaches, seizures, numbness, or tingling. Denies depression, anxiety or insomnia. Denies skin break down or rash.       Observations/Objective: Reports weights 110 pounds 2 weeks ago  Assessment and Plan: Allergic rhinitis Controlled, no change in medication   Osteoporosis Controlled, no change in medication   Dyslipidemia, goal LDL below 100 Hyperlipidemia:Low fat diet discussed and encouraged. Needs to reduce fried and fatty foods as LDL is elevated   Lipid Panel  Lab Results  Component Value Date   CHOL 199 03/23/2019   HDL 65 03/23/2019   LDLCALC 113 (H) 03/23/2019   TRIG 103 03/23/2019   CHOLHDL 3.1 03/23/2019         Follow Up Instructions:    I discussed the assessment and treatment plan with the patient. The patient was provided an opportunity to ask questions and all were answered. The patient agreed with the plan and demonstrated an understanding of the instructions.   The  patient was advised to call back or seek an in-person evaluation if the symptoms worsen or if the condition fails to improve as anticipated.  I provided 21 minutes of non-face-to-face time during this encounter.   Tula Nakayama, MD

## 2019-03-30 NOTE — Assessment & Plan Note (Addendum)
Hyperlipidemia:Low fat diet discussed and encouraged. Needs to reduce fried and fatty foods as LDL is elevated   Lipid Panel  Lab Results  Component Value Date   CHOL 199 03/23/2019   HDL 65 03/23/2019   LDLCALC 113 (H) 03/23/2019   TRIG 103 03/23/2019   CHOLHDL 3.1 03/23/2019

## 2019-05-11 DIAGNOSIS — M810 Age-related osteoporosis without current pathological fracture: Secondary | ICD-10-CM | POA: Diagnosis not present

## 2019-05-12 LAB — COMPLETE METABOLIC PANEL WITH GFR
AG RATIO: 2.2 (calc) (ref 1.0–2.5)
ALT: 12 U/L (ref 6–29)
AST: 16 U/L (ref 10–35)
Albumin: 4.6 g/dL (ref 3.6–5.1)
Alkaline phosphatase (APISO): 79 U/L (ref 37–153)
BILIRUBIN TOTAL: 0.4 mg/dL (ref 0.2–1.2)
BUN: 9 mg/dL (ref 7–25)
CHLORIDE: 107 mmol/L (ref 98–110)
CO2: 27 mmol/L (ref 20–32)
Calcium: 9.8 mg/dL (ref 8.6–10.4)
Creat: 0.79 mg/dL (ref 0.50–0.99)
GFR, Est African American: 90 mL/min/{1.73_m2} (ref >=60)
GFR, Est Non African American: 77 mL/min/{1.73_m2} (ref >=60)
Globulin: 2.1 g/dL (calc) (ref 1.9–3.7)
Glucose, Bld: 79 mg/dL (ref 65–99)
POTASSIUM: 4.5 mmol/L (ref 3.5–5.3)
Sodium: 141 mmol/L (ref 135–146)
Total Protein: 6.7 g/dL (ref 6.1–8.1)

## 2019-05-12 LAB — VITAMIN D 25 HYDROXY (VIT D DEFICIENCY, FRACTURES): Vit D, 25-Hydroxy: 50 ng/mL (ref 30–100)

## 2019-05-13 ENCOUNTER — Encounter: Payer: Self-pay | Admitting: *Deleted

## 2019-05-21 ENCOUNTER — Encounter: Payer: Self-pay | Admitting: "Endocrinology

## 2019-05-22 ENCOUNTER — Ambulatory Visit (INDEPENDENT_AMBULATORY_CARE_PROVIDER_SITE_OTHER): Payer: PPO | Admitting: "Endocrinology

## 2019-05-22 ENCOUNTER — Other Ambulatory Visit: Payer: Self-pay

## 2019-05-22 ENCOUNTER — Encounter: Payer: Self-pay | Admitting: "Endocrinology

## 2019-05-22 DIAGNOSIS — M810 Age-related osteoporosis without current pathological fracture: Secondary | ICD-10-CM

## 2019-05-22 MED ORDER — ALENDRONATE SODIUM 70 MG PO TABS: ORAL_TABLET | ORAL | 12 refills | Status: DC

## 2019-05-22 NOTE — Progress Notes (Signed)
05/22/2019                                Endocrinology Telehealth Visit Follow up Note -During COVID -19 Pandemic  I connected with Megan Gibbs on 05/22/2019   by telephone and verified that I am speaking with the correct person using two identifiers. Megan Gibbs, Megan Gibbs. she has verbally consented to this visit. All issues noted in this document were discussed and addressed. The format was not optimal for physical exam.      Subjective:    Patient ID: Megan Gibbs, female    DOB: [birth year],    Past Medical History:  Diagnosis Date  . Allergic rhinitis   . Anemia   . Anxiety   . Back pain   . Depression   . Hypokalemia   . Osteoporosis   . Prediabetes   . Seasonal allergies   . Vertigo   . Wears glasses    Past Surgical History:  Procedure Laterality Date  . BACK SURGERY  1992 & 2009   Dr. Joya Salm   . BREAST BIOPSY    . BREAST EXCISIONAL BIOPSY    . BREAST LUMPECTOMY WITH RADIOACTIVE SEED LOCALIZATION Right 12/01/2014   Procedure: RIGHT BREAST LUMPECTOMY WITH RADIOACTIVE SEED LOCALIZATION;  Surgeon: Fanny Skates, MD;  Location: [facility];  Service: General;  Laterality: Right;  . COLONOSCOPY N/A 04/19/2014   Procedure: COLONOSCOPY;  Surgeon: Daneil Dolin, MD;  Location: [facility];  Service: Endoscopy;  Laterality: N/A;  9:30 AM  . DILATION AND CURETTAGE OF UTERUS    . TUBAL LIGATION     1976   Social History   Socioeconomic History  . Marital status: Divorced    Spouse name: Not on file  . Number of children: 3  . Years of education: Not on file  . Highest education level: Not on file  Occupational History  . Occupation: disabled     Fish farm manager: UNEMPLOYED  Social Needs  . Financial resource strain: Not hard at all  . Food insecurity:    Worry: Never true    Inability: Never true  . Transportation needs:    Medical: No    Non-medical: No  Tobacco Use  . Smoking status: Never Smoker  . Smokeless tobacco: Never Used  Substance and  Sexual Activity  . Alcohol use: No  . Drug use: No  . Sexual activity: Yes    Birth control/protection: Post-menopausal  Lifestyle  . Physical activity:    Days per week: 3 days    Minutes per session: 20 min  . Stress: Not at all  Relationships  . Social connections:    Talks on phone: More than three times a week    Gets together: Twice a week    Attends religious service: More than 4 times per year    Active member of club or organization: No    Attends meetings of clubs or organizations: Never    Relationship status: Divorced  Other Topics Concern  . Not on file  Social History Narrative   DOING A LOT OF [place].    Outpatient Encounter Medications as of 05/22/2019  Medication Sig  . alendronate (FOSAMAX) 70 MG tablet TAKE 1 TABLET ONCE A WEEK. TAKE WITH A FULL GLASS OF WATER ON AN EMPTY STOMACH.  . Calcium Carb-Cholecalciferol (CALCIUM 600+D3) 600-200 MG-UNIT TABS Take 1 tablet by mouth daily.  [name]  cetirizine (ZYRTEC) 10 MG chewable tablet Chew 10 mg by mouth daily.  . [DISCONTINUED] alendronate (FOSAMAX) 70 MG tablet TAKE 1 TABLET ONCE A WEEK. TAKE WITH A FULL GLASS OF WATER ON AN EMPTY STOMACH.   No facility-administered encounter medications on file as of 05/22/2019.    ALLERGIES: No Known Allergies VACCINATION STATUS: Immunization History  Administered Date(s) Administered  . Influenza Split 09/24/2012  . Influenza Whole 10/04/2009, 09/29/2010, 09/17/2011  . Influenza,inj,Quad PF,6+ Mos 10/29/2013, 10/11/2014, 09/20/2015, 08/20/2016, 09/17/2017, 09/18/2018  . Pneumococcal Conjugate-13 03/17/2015  . Pneumococcal Polysaccharide-23 12/18/2016  . Td 03/24/2010  . Zoster 09/20/2011    HPI  68 yr old female with medical hx as above.  She is returning to follow-up for her osteoporosis.  She was initiated on Fosamax treatment in 2015.  She continues to tolerate this medication.  [name] Her  DXA was in May 2015 which showed T -score of -2.5 on right femoral  neck and -2.4 in L4 verteba. she denies any hx of fracture nor loss of height. She has no new complaints today. -Her repeat bone density from a previous 2017 was reported as no statistical difference compared to the one from 2015. -Her most recent bone density from May 2019 is significantly better than the one from 2011 She has had lumbar disc prolapse for which she underwent surgery in 2002 and 2009. She denies weight loss. she has had menses until her early 53's. has 3 grown children. she denies parathyroid, thyroid dysfunction.  She denies history of fragility fracture.  Review of Systems Limited as above.  Objective:    There were no vitals taken for this visit.  Wt Readings from Last 3 Encounters:  10/29/18 110 lb (49.9 kg)  07/10/18 111 lb (50.3 kg)  06/18/18 110 lb 1.3 oz (49.9 kg)    Physical Exam     Results for orders placed or performed in visit on 10/29/18  Lipid panel  Result Value Ref Range   Cholesterol 199 <200 mg/dL   HDL 65 > OR = 50 mg/dL   Triglycerides 103 <150 mg/dL   LDL Cholesterol (Calc) 113 (H) mg/dL (calc)   Total CHOL/HDL Ratio 3.1 <5.0 (calc)   Non-HDL Cholesterol (Calc) 134 (H) <130 mg/dL (calc)  CBC  Result Value Ref Range   WBC 4.7 3.8 - 10.8 Thousand/uL   RBC 4.45 3.80 - 5.10 Million/uL   Hemoglobin 11.9 11.7 - 15.5 g/dL   HCT 37.0 35.0 - 45.0 %   MCV 83.1 80.0 - 100.0 fL   MCH 26.7 (L) 27.0 - 33.0 pg   MCHC 32.2 32.0 - 36.0 g/dL   RDW 12.9 11.0 - 15.0 %   Platelets 258 140 - 400 Thousand/uL   MPV 11.9 7.5 - 12.5 fL  COMPLETE METABOLIC PANEL WITH GFR  Result Value Ref Range   Glucose, Bld 84 65 - 99 mg/dL   BUN 15 7 - 25 mg/dL   Creat 0.76 0.50 - 0.99 mg/dL   GFR, Est Non African American 81 > OR = 60 mL/min/1.33m2   GFR, Est African American 94 > OR = 60 mL/min/1.70m2   BUN/Creatinine Ratio NOT APPLICABLE 6 - 22 (calc)   Sodium 140 135 - 146 mmol/L   Potassium 4.7 3.5 - 5.3 mmol/L   Chloride 105 98 - 110 mmol/L   CO2 27 20 - 32  mmol/L   Calcium 9.7 8.6 - 10.4 mg/dL   Total Protein 7.0 6.1 - 8.1 g/dL   Albumin 4.8 3.6 -  5.1 g/dL   Globulin 2.2 1.9 - 3.7 g/dL (calc)   AG Ratio 2.2 1.0 - 2.5 (calc)   Total Bilirubin 0.4 0.2 - 1.2 mg/dL   Alkaline phosphatase (APISO) 82 37 - 153 U/L   AST 16 10 - 35 U/L   ALT 12 6 - 29 U/L   Complete Blood Count (Most recent): Lab Results  Component Value Date   WBC 4.7 03/23/2019   HGB 11.9 03/23/2019   HCT 37.0 03/23/2019   MCV 83.1 03/23/2019   PLT 258 03/23/2019   Chemistry (most recent): Lab Results  Component Value Date   NA 141 05/11/2019   K 4.5 05/11/2019   CL 107 05/11/2019   CO2 27 05/11/2019   BUN 9 05/11/2019   CREATININE 0.79 05/11/2019   Diabetic Labs (most recent): Lab Results  Component Value Date   HGBA1C 5.5 12/11/2016   HGBA1C 5.6 12/12/2015   HGBA1C 5.6 06/21/2015   Lipid profile (most recent): Lab Results  Component Value Date   TRIG 103 03/23/2019   CHOL 199 03/23/2019     Assessment & Plan:   1. Idiopathic osteoporosis  - Hers is settled , likely senile osteoporosis .  DEXA in 2015 T-Score -2.5 in femoral neck and -2.4 in L4. She denies fracture hx. She is tolerating Fosamax continue milligrams p.o. weekly. -Repeat DEXA from 2017 and 2019 was reported as no status Difference, although generally improving.    This is  a good development for her since she has not lost significant bone density since 2015. I advised her to continue on same treatment program of Fosamax 70 mg weekly. Her PTH is normal along with calcium and TFTs, ruling out secondary cause of osteoporosis. She is made aware of the possibility of treating her with Fosamax for 5 to 8 years before considering drug holiday. Risk of untreated osteoporosis is discussed with her in detail.    2. Vitamin D deficiency - She is now vitamin D replete. I advised her to stop the weekly vitamin D supplement of 50,000 units. She will continue with the combined calcium/vitamin D  supplement.  I advised patient to maintain close follow up with their PCP for primary care needs.   Time for this visit 15 minutes.  Megan Gibbs participated in the discussions, expressed understanding, and voiced agreement with the above plans.  All questions were answered to her satisfaction. she is encouraged to contact clinic should she have any questions or concerns prior to her return visit.  Follow up plan: Return in about 1 year (around 05/21/2020) for Follow up with Pre-visit Labs, Follow up with Bone Density.  Glade Lloyd, MD Phone: [phone]  Fax: [phone]  -  This note was partially dictated with voice recognition software. Similar sounding words can be transcribed inadequately or may not  be corrected upon review.  05/22/2019, 10:05 AM

## 2019-06-24 ENCOUNTER — Encounter (INDEPENDENT_AMBULATORY_CARE_PROVIDER_SITE_OTHER): Payer: Self-pay

## 2019-06-24 ENCOUNTER — Other Ambulatory Visit: Payer: Self-pay

## 2019-06-24 ENCOUNTER — Encounter: Payer: Self-pay | Admitting: Family Medicine

## 2019-06-24 ENCOUNTER — Ambulatory Visit (INDEPENDENT_AMBULATORY_CARE_PROVIDER_SITE_OTHER): Payer: PPO | Admitting: Family Medicine

## 2019-06-24 VITALS — BP 110/70 | HR 80 | Resp 15 | Ht 62.0 in | Wt 110.0 lb

## 2019-06-24 DIAGNOSIS — Z0000 Encounter for general adult medical examination without abnormal findings: Secondary | ICD-10-CM | POA: Diagnosis not present

## 2019-06-24 NOTE — Patient Instructions (Signed)
Megan Gibbs , Thank you for taking time to come for your Medicare Wellness Visit. I appreciate your ongoing commitment to your health goals. Please review the following plan we discussed and let me know if I can assist you in the future.   Please continue to practice social distancing during this time to keep you, your family and our community safe.  If you must go out please continue to wear a mask and practice good hand hygiene.  Screening recommendations/referrals: Colonoscopy: Due 2025 Mammogram: Due nov 2020 Bone Density: Completed 04/2018 Recommended yearly ophthalmology/optometry visit for glaucoma screening and checkup Recommended yearly dental visit for hygiene and checkup  Vaccinations: Influenza vaccine: Due Fall 2020 Pneumococcal vaccine: Completed  Tdap vaccine: Due 2021 Shingles vaccine: Check coverage with insurance  Advanced directives: Please let us know if you would like information on this. We are happy to help.  Conditions/risks identified: Falls are always a risk for [place]. Please be safe.  Next appointment: 09/08/2019   Preventive Care 65 Years and Older, Female Preventive care refers to lifestyle choices and visits with your health care provider that can promote health and wellness. What does preventive care include?  A yearly physical exam. This is also called an annual well check.  Dental exams once or twice a year.  Routine eye exams. Ask your health care provider how often you should have your eyes checked.  Personal lifestyle choices, including:  Daily care of your teeth and gums.  Regular physical activity.  Eating a healthy diet.  Avoiding tobacco and drug use.  Limiting alcohol use.  Practicing safe sex.  Taking low-dose aspirin every day.  Taking vitamin and mineral supplements as recommended by your health care provider. What happens during an annual well check? The services and screenings done by your health care provider during your  annual well check will depend on your age, overall health, lifestyle risk factors, and family history of disease. Counseling  Your health care provider may ask you questions about your:  Alcohol use.  Tobacco use.  Drug use.  Emotional well-being.  Home and relationship well-being.  Sexual activity.  Eating habits.  History of falls.  Memory and ability to understand (cognition).  Work and work Statistician.  Reproductive health. Screening  You may have the following tests or measurements:  Height, weight, and BMI.  Blood pressure.  Lipid and cholesterol levels. These may be checked every 5 years, or more frequently if you are over 18 years old.  Skin check.  Lung cancer screening. You may have this screening every year starting at age 22 if you have a 30-pack-year history of smoking and currently smoke or have quit within the past 15 years.  Fecal occult blood test (FOBT) of the stool. You may have this test every year starting at age 50.  Flexible sigmoidoscopy or colonoscopy. You may have a sigmoidoscopy every 5 years or a colonoscopy every 10 years starting at age 67.  Hepatitis C blood test.  Hepatitis B blood test.  Sexually transmitted disease (STD) testing.  Diabetes screening. This is done by checking your blood sugar (glucose) after you have not eaten for a while (fasting). You may have this done every 1-3 years.  Bone density scan. This is done to screen for osteoporosis. You may have this done starting at age 60.  Mammogram. This may be done every 1-2 years. Talk to your health care provider about how often you should have regular mammograms. Talk with your health care provider  about your test results, treatment options, and if necessary, the need for more tests. Vaccines  Your health care provider may recommend certain vaccines, such as:  Influenza vaccine. This is recommended every year.  Tetanus, diphtheria, and acellular pertussis (Tdap, Td)  vaccine. You may need a Td booster every 10 years.  Zoster vaccine. You may need this after age 35.  Pneumococcal 13-valent conjugate (PCV13) vaccine. One dose is recommended after age 59.  Pneumococcal polysaccharide (PPSV23) vaccine. One dose is recommended after age 58. Talk to your health care provider about which screenings and vaccines you need and how often you need them. This information is not intended to replace advice given to you by your health care provider. Make sure you discuss any questions you have with your health care provider. Document Released: 01/13/2016 Document Revised: 09/05/2016 Document Reviewed: 10/18/2015 Elsevier Interactive Patient Education  2017 [place] Prevention in the Home Falls can cause injuries. They can happen to people of all ages. There are many things you can do to make your home safe and to help prevent falls. What can I do on the outside of my home?  Regularly fix the edges of walkways and driveways and fix any cracks.  Remove anything that might make you trip as you walk through a door, such as a raised step or threshold.  Trim any bushes or trees on the path to your home.  Use bright outdoor lighting.  Clear any walking paths of anything that might make someone trip, such as rocks or tools.  Regularly check to see if handrails are loose or broken. Make sure that both sides of any steps have handrails.  Any raised decks and porches should have guardrails on the edges.  Have any leaves, snow, or ice cleared regularly.  Use sand or salt on walking paths during winter.  Clean up any spills in your garage right away. This includes oil or grease spills. What can I do in the bathroom?  Use night lights.  Install grab bars by the toilet and in the tub and shower. Do not use towel bars as grab bars.  Use non-skid mats or decals in the tub or shower.  If you need to sit down in the shower, use a plastic, non-slip stool.   Keep the floor dry. Clean up any water that spills on the floor as soon as it happens.  Remove soap buildup in the tub or shower regularly.  Attach bath mats securely with double-sided non-slip rug tape.  Do not have throw rugs and other things on the floor that can make you trip. What can I do in the bedroom?  Use night lights.  Make sure that you have a light by your bed that is easy to reach.  Do not use any sheets or blankets that are too big for your bed. They should not hang down onto the floor.  Have a firm chair that has side arms. You can use this for support while you get dressed.  Do not have throw rugs and other things on the floor that can make you trip. What can I do in the kitchen?  Clean up any spills right away.  Avoid walking on wet floors.  Keep items that you use a lot in easy-to-reach places.  If you need to reach something above you, use a strong step stool that has a grab bar.  Keep electrical cords out of the way.  Do not use floor polish or  wax that makes floors slippery. If you must use wax, use non-skid floor wax.  Do not have throw rugs and other things on the floor that can make you trip. What can I do with my stairs?  Do not leave any items on the stairs.  Make sure that there are handrails on both sides of the stairs and use them. Fix handrails that are broken or loose. Make sure that handrails are as long as the stairways.  Check any carpeting to make sure that it is firmly attached to the stairs. Fix any carpet that is loose or worn.  Avoid having throw rugs at the top or bottom of the stairs. If you do have throw rugs, attach them to the floor with carpet tape.  Make sure that you have a light switch at the top of the stairs and the bottom of the stairs. If you do not have them, ask someone to add them for you. What else can I do to help prevent falls?  Wear shoes that:  Do not have high heels.  Have rubber bottoms.  Are  comfortable and fit you well.  Are closed at the toe. Do not wear sandals.  If you use a stepladder:  Make sure that it is fully opened. Do not climb a closed stepladder.  Make sure that both sides of the stepladder are locked into place.  Ask someone to hold it for you, if possible.  Clearly mark and make sure that you can see:  Any grab bars or handrails.  First and last steps.  Where the edge of each step is.  Use tools that help you move around (mobility aids) if they are needed. These include:  Canes.  Walkers.  Scooters.  Crutches.  Turn on the lights when you go into a dark area. Replace any light bulbs as soon as they burn out.  Set up your furniture so you have a clear path. Avoid moving your furniture around.  If any of your floors are uneven, fix them.  If there are any pets around you, be aware of where they are.  Review your medicines with your doctor. Some medicines can make you feel dizzy. This can increase your chance of falling. Ask your doctor what other things that you can do to help prevent falls. This information is not intended to replace advice given to you by your health care provider. Make sure you discuss any questions you have with your health care provider. Document Released: 10/13/2009 Document Revised: 05/24/2016 Document Reviewed: 01/21/2015 Elsevier Interactive Patient Education  2017 [employer].

## 2019-06-24 NOTE — Progress Notes (Signed)
Subjective:   Megan Gibbs is a 68 y.o. female who presents for Medicare Annual (Subsequent) preventive examination.  Location of Patient: Home Location of Provider: Telehealth Consent was obtain for visit to be over via telehealth. I verified that I am speaking with the correct person using two identifiers.  Review of Systems:    Cardiac Risk Factors include: advanced age (>32men, >88 women);dyslipidemia     Objective:     Vitals: BP 110/70   Pulse 80   Resp 15   Ht 5\' 2"  (1.575 m)   Wt 110 lb (49.9 kg)   BMI 20.12 kg/m   Body mass index is 20.12 kg/m.  Advanced Directives 06/18/2018 05/09/2017 12/30/2016 05/13/2015 12/01/2014 11/24/2014 05/03/2014  Does Patient Have a Medical Advance Directive? No No No No No No Patient does not have advance directive  Would patient like information on creating a medical advance directive? Yes (MAU/Ambulatory/Procedural Areas - Information given) Yes (MAU/Ambulatory/Procedural Areas - Information given) No - Patient declined No - patient declined information No - patient declined information No - patient declined information -  Pre-existing out of facility DNR order (yellow form or pink MOST form) - - - - - - -    Tobacco Social History   Tobacco Use  Smoking Status Never Smoker  Smokeless Tobacco Never Used     Counseling given: Not Answered   Clinical Intake:  Pre-visit preparation completed: Yes  Pain : No/denies pain Pain Score: 0-No pain     BMI - recorded: 20.12 Nutritional Status: BMI of 19-24  Normal Nutritional Risks: None Diabetes: No  How often do you need to have someone help you when you read instructions, pamphlets, or other written materials from your doctor or pharmacy?: 1 - Never What is the last grade level you completed in school?: 12  Interpreter Needed?: No     Past Medical History:  Diagnosis Date  . Allergic rhinitis   . Anemia   . Anxiety   . Back pain   . Depression   . Hypokalemia   .  Osteoporosis   . Prediabetes   . Seasonal allergies   . Vertigo   . Wears glasses    Past Surgical History:  Procedure Laterality Date  . BACK SURGERY  1992 & 2009   Dr. Joya Salm   . BREAST BIOPSY    . BREAST EXCISIONAL BIOPSY    . BREAST LUMPECTOMY WITH RADIOACTIVE SEED LOCALIZATION Right 12/01/2014   Procedure: RIGHT BREAST LUMPECTOMY WITH RADIOACTIVE SEED LOCALIZATION;  Surgeon: Fanny Skates, MD;  Location: [facility];  Service: General;  Laterality: Right;  . COLONOSCOPY N/A 04/19/2014   Procedure: COLONOSCOPY;  Surgeon: Daneil Dolin, MD;  [facility];  Service: Endoscopy;  Laterality: N/A;  9:30 AM  . DILATION AND CURETTAGE OF UTERUS    . TUBAL LIGATION     1976   Family History  Problem Relation Age of Onset  . Diabetes Mother   . Hypertension Mother   . Asthma Sister   . Thyroid disease Sister   . COPD Sister   . Anemia Sister   . Diabetes Sister   . Hypertension Sister   . Diabetes Sister   . Schizophrenia Father   . Drug abuse Brother   . Alcohol abuse Brother   . Sleep apnea Grandchild   . ADD / ADHD Grandchild   . Seizures Grandchild   . Breast cancer Sister 33  . Thyroid disease Sister   .  Hypertension Brother   . Anxiety disorder Maternal Aunt   . Hypertension Daughter   . Anemia Daughter   . Hypertension Daughter   . Asthma Daughter   . Colon cancer Neg Hx    Social History   Socioeconomic History  . Marital status: Divorced    Spouse name: Not on file  . Number of children: 3  . Years of education: Not on file  . Highest education level: Not on file  Occupational History  . Occupation: disabled     Fish farm manager: UNEMPLOYED  Social Needs  . Financial resource strain: Not hard at all  . Food insecurity    Worry: Never true    Inability: Never true  . Transportation needs    Medical: No    Non-medical: No  Tobacco Use  . Smoking status: Never Smoker  . Smokeless tobacco: Never Used  Substance and Sexual Activity   . Alcohol use: No  . Drug use: No  . Sexual activity: Yes    Birth control/protection: Post-menopausal  Lifestyle  . Physical activity    Days per week: 3 days    Minutes per session: 20 min  . Stress: Not at all  Relationships  . Social connections    Talks on phone: More than three times a week    Gets together: Twice a week    Attends religious service: More than 4 times per year    Active member of club or organization: No    Attends meetings of clubs or organizations: Never    Relationship status: Divorced  Other Topics Concern  . Not on file  Social History Narrative   DOING A LOT OF [place].     Outpatient Encounter Medications as of 06/24/2019  Medication Sig  . alendronate (FOSAMAX) 70 MG tablet TAKE 1 TABLET ONCE A WEEK. TAKE WITH A FULL GLASS OF WATER ON AN EMPTY STOMACH.  . Calcium Carb-Cholecalciferol (CALCIUM 600+D3) 600-200 MG-UNIT TABS Take 1 tablet by mouth daily.  . cetirizine (ZYRTEC) 10 MG chewable tablet Chew 10 mg by mouth daily.   No facility-administered encounter medications on file as of 06/24/2019.     Activities of Daily Living In your present state of health, do you have any difficulty performing the following activities: 06/24/2019  Hearing? N  Vision? N  Difficulty concentrating or making decisions? N  Walking or climbing stairs? N  Dressing or bathing? N  Doing errands, shopping? N  Preparing Food and eating ? N  Using the Toilet? N  In the past six months, have you accidently leaked urine? N  Do you have problems with loss of bowel control? N  Managing your Medications? N  Managing your Finances? N  Housekeeping or managing your Housekeeping? N  Some recent data might be hidden    Patient Care Team: Fayrene Helper, MD as PCP - General Rourk, Cristopher Estimable, MD as Consulting Physician (Gastroenterology) Lay, Bobbye Charleston, MD (Psychiatry) Cassandria Anger, MD as Consulting Physician (Endocrinology) Madelin Headings, DO as Consulting Physician (Optometry)    Assessment:   This is a routine wellness examination for [facility].  Exercise Activities and Dietary recommendations Current Exercise Habits: Home exercise routine, Type of exercise: treadmill;calisthenics, Time (Minutes): 30, Frequency (Times/Week): 2, Weekly Exercise (Minutes/Week): 60, Intensity: Mild, Exercise limited by: None identified  Goals   None     Fall Risk Fall Risk  06/24/2019 03/30/2019 10/29/2018 07/10/2018 06/18/2018  Falls in the past year? 0 0 Yes Yes  Yes  Number falls in past yr: - 0 1 1 1   Injury with Fall? 0 0 - - No   Is the patient's home free of loose throw rugs in walkways, pet beds, electrical cords, etc?   yes      Grab bars in the bathroom? no      Handrails on the stairs?   yes      Adequate lighting?   yes     Depression Screen PHQ 2/9 Scores 06/24/2019 10/29/2018 07/10/2018 06/18/2018  PHQ - 2 Score 0 3 0 0  PHQ- 9 Score - 8 0 -     Cognitive Function     6CIT Screen 06/24/2019 06/18/2018 05/09/2017  What Year? 0 points 0 points 0 points  What month? 0 points 0 points 0 points  What time? 0 points 0 points 0 points  Count back from 20 0 points 0 points 0 points  Months in reverse 0 points 0 points 0 points  Repeat phrase 2 points 2 points 0 points  Total Score 2 2 0    Immunization History  Administered Date(s) Administered  . Influenza Split 09/24/2012  . Influenza Whole 10/04/2009, 09/29/2010, 09/17/2011  . Influenza,inj,Quad PF,6+ Mos 10/29/2013, 10/11/2014, 09/20/2015, 08/20/2016, 09/17/2017, 09/18/2018  . Pneumococcal Conjugate-13 03/17/2015  . Pneumococcal Polysaccharide-23 12/18/2016  . Td 03/24/2010  . Zoster 09/20/2011    Qualifies for Shingles Vaccine? Checking coverage with insurance  Screening Tests Health Maintenance  Topic Date Due  . INFLUENZA VACCINE  08/01/2019  . TETANUS/TDAP  03/24/2020  . MAMMOGRAM  11/03/2020  . COLONOSCOPY  04/19/2024  . DEXA SCAN  Completed  .  Hepatitis C Screening  Completed  . PNA vac Low Risk Adult  Completed    Cancer Screenings: Lung: Low Dose CT Chest recommended if Age 33-80 years, 30 pack-year currently smoking OR have quit w/in 15years. Patient does not qualify. Breast:  Up to date on Mammogram? Yes   Up to date of Bone Density/Dexa? Yes Colorectal:  Due 2025  Additional Screenings:   Hepatitis C Screening: completed     Plan:      1. Encounter for Medicare annual wellness exam  I have personally reviewed and noted the following in the patient's chart:   . Medical and social history . Use of alcohol, tobacco or illicit drugs  . Current medications and supplements . Functional ability and status . Nutritional status . Physical activity . Advanced directives . List of other physicians . Hospitalizations, surgeries, and ER visits in previous 12 months . Vitals . Screenings to include cognitive, depression, and falls . Referrals and appointments  In addition, I have reviewed and discussed with patient certain preventive protocols, quality metrics, and best practice recommendations. A written personalized care plan for preventive services as well as general preventive health recommendations were provided to patient.   I provided 20 minutes of non-face-to-face time during this encounter.    Perlie Mayo, NP  06/24/2019

## 2019-08-24 ENCOUNTER — Ambulatory Visit: Payer: Self-pay

## 2019-09-08 ENCOUNTER — Encounter: Payer: Self-pay | Admitting: Family Medicine

## 2019-09-14 ENCOUNTER — Other Ambulatory Visit: Payer: Self-pay

## 2019-09-14 ENCOUNTER — Ambulatory Visit (INDEPENDENT_AMBULATORY_CARE_PROVIDER_SITE_OTHER): Payer: PPO

## 2019-09-14 DIAGNOSIS — Z23 Encounter for immunization: Secondary | ICD-10-CM

## 2019-10-06 ENCOUNTER — Other Ambulatory Visit (HOSPITAL_COMMUNITY): Payer: Self-pay | Admitting: Family Medicine

## 2019-10-06 DIAGNOSIS — Z1231 Encounter for screening mammogram for malignant neoplasm of breast: Secondary | ICD-10-CM

## 2019-10-13 ENCOUNTER — Ambulatory Visit (INDEPENDENT_AMBULATORY_CARE_PROVIDER_SITE_OTHER): Payer: PPO | Admitting: Family Medicine

## 2019-10-13 ENCOUNTER — Encounter: Payer: Self-pay | Admitting: Family Medicine

## 2019-10-13 ENCOUNTER — Other Ambulatory Visit: Payer: Self-pay

## 2019-10-13 VITALS — BP 118/78 | HR 70 | Temp 97.6°F | Resp 15 | Ht 62.0 in | Wt 111.1 lb

## 2019-10-13 DIAGNOSIS — M25522 Pain in left elbow: Secondary | ICD-10-CM

## 2019-10-13 DIAGNOSIS — E785 Hyperlipidemia, unspecified: Secondary | ICD-10-CM

## 2019-10-13 DIAGNOSIS — Z0000 Encounter for general adult medical examination without abnormal findings: Secondary | ICD-10-CM | POA: Diagnosis not present

## 2019-10-13 NOTE — Patient Instructions (Signed)
F/u in March, call if you need me sooner  Please get fasting lipid panel and CBC first week in March  For left elbow pain, samples of advil are given . Take one tablet two times daily for 5 days. Also start your exercises that you were doing before that corrected the problem  Excellent exam. Thankful you are doing very well, continue to take care of yourself  Thanks for choosing [facility], we consider it a privelige to serve you.

## 2019-10-13 NOTE — Progress Notes (Signed)
     Megan Gibbs     MRN: [id number]      DOB: 1951/03/05  HPI: Patient is in for annual physical exam. C/o left elbow pain x 3 days, has had this I the past, and it was relieved with anti inflammatory and exercises, which she remembers well  PE: BP 118/78   Pulse 70   Temp 97.6 F (36.4 C) (Temporal)   Resp 15   Ht 5\' 2"  (1.575 m)   Wt 111 lb 1.9 oz (50.4 kg)   SpO2 99%   BMI 20.32 kg/m   Pleasant  female, alert and oriented x 3, in no cardio-pulmonary distress. Afebrile. HEENT No facial trauma or asymetry. Sinuses non tender.  Extra occullar muscles intact.. External ears normal, . Neck: supple, no adenopathy,JVD or thyromegaly.No bruits.  Chest: Clear to ascultation bilaterally.No crackles or wheezes. Non tender to palpation  Breast: No asymetry,no masses or lumps. No tenderness. No nipple discharge or inversion. No axillary or supraclavicular adenopathy  Cardiovascular system; Heart sounds normal,  S1 and  S2 ,no S3.  No murmur, or thrill. Apical beat not displaced Peripheral pulses normal.  Abdomen: Soft, non tender, no organomegaly or masses. No bruits. Bowel sounds normal. No guarding, tenderness or rebound.    GU: Asymptomatic, not examined   Musculoskeletal exam: Full ROM of spine, hips , shoulders and knees.Decreased ROM left elbow with tendernss No deformity ,swelling or crepitus noted. No muscle wasting or atrophy.   Neurologic: Cranial nerves 2 to 12 intact. Power, tone ,sensation and reflexes normal throughout. No disturbance in gait. No tremor.  Skin: Intact, no ulceration, erythema , scaling or rash noted. Pigmentation normal throughout  Psych; Normal mood and affect. Judgement and concentration normal   Assessment & Plan:  Annual physical exam Annual exam as documented. Counseling done  re healthy lifestyle involving commitment to 150 minutes exercise per week, heart healthy diet, and attaining healthy weight.The importance of  adequate sleep also discussed.  Immunization and cancer screening needs are specifically addressed at this visit.and are both up to date   Elbow pain, left 5 day anti inflammatory course and home  exercises

## 2019-10-14 ENCOUNTER — Encounter: Payer: Self-pay | Admitting: Family Medicine

## 2019-10-14 DIAGNOSIS — M25522 Pain in left elbow: Secondary | ICD-10-CM | POA: Insufficient documentation

## 2019-10-14 NOTE — Assessment & Plan Note (Signed)
Annual exam as documented. Counseling done  re healthy lifestyle involving commitment to 150 minutes exercise per week, heart healthy diet, and attaining healthy weight.The importance of adequate sleep also discussed.  Immunization and cancer screening needs are specifically addressed at this visit.and are both up to date

## 2019-10-14 NOTE — Assessment & Plan Note (Signed)
5 day anti inflammatory course and home  exercises

## 2019-11-11 ENCOUNTER — Other Ambulatory Visit: Payer: Self-pay

## 2019-11-11 ENCOUNTER — Ambulatory Visit (HOSPITAL_COMMUNITY)
Admission: RE | Admit: 2019-11-11 | Discharge: 2019-11-11 | Disposition: A | Discharge status: Home / Self Care | Payer: PPO | Source: Ambulatory Visit | Attending: Family Medicine | Admitting: Family Medicine

## 2019-11-11 DIAGNOSIS — Z1231 Encounter for screening mammogram for malignant neoplasm of breast: Secondary | ICD-10-CM | POA: Insufficient documentation

## 2020-02-03 DIAGNOSIS — H52223 Regular astigmatism, bilateral: Secondary | ICD-10-CM | POA: Diagnosis not present

## 2020-02-03 DIAGNOSIS — H25813 Combined forms of age-related cataract, bilateral: Secondary | ICD-10-CM | POA: Diagnosis not present

## 2020-02-03 DIAGNOSIS — H33193 Other retinoschisis and retinal cysts, bilateral: Secondary | ICD-10-CM | POA: Diagnosis not present

## 2020-02-03 DIAGNOSIS — H524 Presbyopia: Secondary | ICD-10-CM | POA: Diagnosis not present

## 2020-02-03 DIAGNOSIS — H5203 Hypermetropia, bilateral: Secondary | ICD-10-CM | POA: Diagnosis not present

## 2020-03-03 ENCOUNTER — Ambulatory Visit: Payer: PPO | Admitting: Family Medicine

## 2020-03-07 DIAGNOSIS — E785 Hyperlipidemia, unspecified: Secondary | ICD-10-CM | POA: Diagnosis not present

## 2020-03-07 LAB — LIPID PANEL
Cholesterol: 184 mg/dL (ref <200)
HDL: 59 mg/dL (ref >=50)
LDL Cholesterol (Calc): 105 mg/dL (calc) — ABNORMAL HIGH
Non-HDL Cholesterol (Calc): 125 mg/dL (calc) (ref <130)
Total CHOL/HDL Ratio: 3.1 (calc) (ref <5.0)
Triglycerides: 107 mg/dL (ref <150)

## 2020-03-07 LAB — CBC
HCT: 38.5 % (ref 35.0–45.0)
Hemoglobin: 12.1 g/dL (ref 11.7–15.5)
MCH: 26.1 pg — ABNORMAL LOW (ref 27.0–33.0)
MCHC: 31.4 g/dL — ABNORMAL LOW (ref 32.0–36.0)
MCV: 83 fL (ref 80.0–100.0)
MPV: 12.1 fL (ref 7.5–12.5)
Platelets: 255 10*3/uL (ref 140–400)
RBC: 4.64 10*6/uL (ref 3.80–5.10)
RDW: 12.9 % (ref 11.0–15.0)
WBC: 4.7 10*3/uL (ref 3.8–10.8)

## 2020-03-16 ENCOUNTER — Ambulatory Visit: Payer: PPO | Admitting: Family Medicine

## 2020-03-17 ENCOUNTER — Ambulatory Visit: Payer: PPO | Admitting: Family Medicine

## 2020-04-21 ENCOUNTER — Other Ambulatory Visit: Payer: Self-pay | Admitting: "Endocrinology

## 2020-05-03 ENCOUNTER — Telehealth: Payer: Self-pay | Admitting: "Endocrinology

## 2020-05-03 DIAGNOSIS — E213 Hyperparathyroidism, unspecified: Secondary | ICD-10-CM

## 2020-05-03 DIAGNOSIS — E559 Vitamin D deficiency, unspecified: Secondary | ICD-10-CM

## 2020-05-03 DIAGNOSIS — M810 Age-related osteoporosis without current pathological fracture: Secondary | ICD-10-CM

## 2020-05-03 NOTE — Telephone Encounter (Signed)
Can you update lab order °

## 2020-05-09 ENCOUNTER — Other Ambulatory Visit: Payer: Self-pay

## 2020-05-09 DIAGNOSIS — E559 Vitamin D deficiency, unspecified: Secondary | ICD-10-CM | POA: Diagnosis not present

## 2020-05-09 DIAGNOSIS — E213 Hyperparathyroidism, unspecified: Secondary | ICD-10-CM | POA: Diagnosis not present

## 2020-05-09 DIAGNOSIS — M810 Age-related osteoporosis without current pathological fracture: Secondary | ICD-10-CM | POA: Diagnosis not present

## 2020-05-10 LAB — COMPREHENSIVE METABOLIC PANEL
AG Ratio: 1.6 (calc) (ref 1.0–2.5)
ALT: 16 U/L (ref 6–29)
AST: 19 U/L (ref 10–35)
Albumin: 4.6 g/dL (ref 3.6–5.1)
Alkaline phosphatase (APISO): 73 U/L (ref 37–153)
BUN: 15 mg/dL (ref 7–25)
CO2: 28 mmol/L (ref 20–32)
Calcium: 10 mg/dL (ref 8.6–10.4)
Chloride: 103 mmol/L (ref 98–110)
Creat: 0.7 mg/dL (ref 0.50–0.99)
Globulin: 2.8 g/dL (calc) (ref 1.9–3.7)
Glucose, Bld: 87 mg/dL (ref 65–99)
Potassium: 4.5 mmol/L (ref 3.5–5.3)
Sodium: 140 mmol/L (ref 135–146)
Total Bilirubin: 0.4 mg/dL (ref 0.2–1.2)
Total Protein: 7.4 g/dL (ref 6.1–8.1)

## 2020-05-10 LAB — T4, FREE: Free T4: 1.1 ng/dL (ref 0.8–1.8)

## 2020-05-10 LAB — VITAMIN D 25 HYDROXY (VIT D DEFICIENCY, FRACTURES): Vit D, 25-Hydroxy: 26 ng/mL — ABNORMAL LOW (ref 30–100)

## 2020-05-10 LAB — TSH: TSH: 2.14 mIU/L (ref 0.40–4.50)

## 2020-05-17 ENCOUNTER — Ambulatory Visit (HOSPITAL_COMMUNITY)
Admission: RE | Admit: 2020-05-17 | Discharge: 2020-05-17 | Disposition: A | Discharge status: Home / Self Care | Payer: PPO | Source: Ambulatory Visit | Attending: "Endocrinology | Admitting: "Endocrinology

## 2020-05-17 ENCOUNTER — Other Ambulatory Visit: Payer: Self-pay

## 2020-05-17 DIAGNOSIS — M8589 Other specified disorders of bone density and structure, multiple sites: Secondary | ICD-10-CM | POA: Diagnosis not present

## 2020-05-17 DIAGNOSIS — M810 Age-related osteoporosis without current pathological fracture: Secondary | ICD-10-CM | POA: Insufficient documentation

## 2020-05-17 DIAGNOSIS — Z780 Asymptomatic menopausal state: Secondary | ICD-10-CM | POA: Diagnosis not present

## 2020-05-20 ENCOUNTER — Other Ambulatory Visit: Payer: Self-pay

## 2020-05-20 ENCOUNTER — Encounter: Payer: Self-pay | Admitting: "Endocrinology

## 2020-05-20 ENCOUNTER — Ambulatory Visit (INDEPENDENT_AMBULATORY_CARE_PROVIDER_SITE_OTHER): Payer: PPO | Admitting: "Endocrinology

## 2020-05-20 VITALS — BP 135/81 | HR 69 | Ht 62.0 in | Wt 112.0 lb

## 2020-05-20 DIAGNOSIS — E559 Vitamin D deficiency, unspecified: Secondary | ICD-10-CM | POA: Diagnosis not present

## 2020-05-20 DIAGNOSIS — M810 Age-related osteoporosis without current pathological fracture: Secondary | ICD-10-CM | POA: Diagnosis not present

## 2020-05-20 MED ORDER — VITAMIN D (ERGOCALCIFEROL) 1.25 MG (50000 UNIT) PO CAPS: 50000.0000 [IU] | ORAL_CAPSULE | ORAL | 0 refills | Status: DC

## 2020-05-20 MED ORDER — ALENDRONATE SODIUM 70 MG PO TABS: ORAL_TABLET | ORAL | 1 refills | Status: DC

## 2020-05-20 NOTE — Progress Notes (Signed)
05/20/2020     Endocrinology follow-up note   Subjective:    Patient ID: Megan Gibbs, female    DOB: Aug 31, 1951,    Past Medical History:  Diagnosis Date  . Allergic rhinitis   . Anemia   . Anxiety   . Back pain   . Depression   . Hypokalemia   . Osteoporosis   . Prediabetes   . Seasonal allergies   . Vertigo   . Wears glasses    Past Surgical History:  Procedure Laterality Date  . BACK SURGERY  1992 & 2009   Dr. Joya Salm   . BREAST BIOPSY    . BREAST EXCISIONAL BIOPSY    . BREAST LUMPECTOMY WITH RADIOACTIVE SEED LOCALIZATION Right 12/01/2014   Procedure: RIGHT BREAST LUMPECTOMY WITH RADIOACTIVE SEED LOCALIZATION;  Surgeon: Fanny Skates, MD;  Location: [facility];  Service: General;  Laterality: Right;  . COLONOSCOPY N/A 04/19/2014   Procedure: COLONOSCOPY;  Surgeon: Daneil Dolin, MD;  [facility];  Service: Endoscopy;  Laterality: N/A;  9:30 AM  . DILATION AND CURETTAGE OF UTERUS    . TUBAL LIGATION     1976   Social History   Socioeconomic History  . Marital status: Divorced    Spouse name: Not on file  . Number of children: 3  . Years of education: Not on file  . Highest education level: Not on file  Occupational History  . Occupation: disabled     Fish farm manager: UNEMPLOYED  Tobacco Use  . Smoking status: Never Smoker  . Smokeless tobacco: Never Used  Substance and Sexual Activity  . Alcohol use: No  . Drug use: No  . Sexual activity: Yes    Birth control/protection: Post-menopausal  Other Topics Concern  . Not on file  Social History Narrative   DOING A LOT OF [place].    Social Determinants of Health   Financial Resource Strain:   . Difficulty of Paying Living Expenses:   Food Insecurity:   . Worried About Charity fundraiser in the Last Year:   . Arboriculturist in the Last Year:   Transportation Needs:   . Film/video editor (Medical):   [name] Lack of Transportation (Non-Medical):   Physical  Activity:   . Days of Exercise per Week:   . Minutes of Exercise per Session:   Stress:   . Feeling of Stress :   Social Connections:   . Frequency of Communication with Friends and Family:   . Frequency of Social Gatherings with Friends and Family:   . Attends Religious Services:   . Active Member of Clubs or Organizations:   . Attends Archivist Meetings:   [name] Marital Status:    Outpatient Encounter Medications as of 05/20/2020  Medication Sig  . alendronate (FOSAMAX) 70 MG tablet TAKE 1 TAB WEEKLY 30 MINS. BEFORE BREAKFAST WITH 8 OZ OF WATER FOR OSTEOPOROSIS.  [name] cetirizine (ZYRTEC) 10 MG chewable tablet Chew 10 mg by mouth daily.  . [DISCONTINUED] alendronate (FOSAMAX) 70 MG tablet TAKE 1 TAB WEEKLY 30 MINS. BEFORE BREAKFAST WITH 8 OZ OF WATER FOR OSTEOPOROSIS.  [name] Calcium Carb-Cholecalciferol (CALCIUM 600+D3) 600-200 MG-UNIT TABS Take 1 tablet by mouth daily.  . Multiple Vitamin (MULTIVITAMIN) capsule Take 1 capsule by mouth daily.  . Vitamin D, Ergocalciferol, (DRISDOL) 1.25 MG (50000 UNIT) CAPS capsule Take 1 capsule (50,000 Units total) by mouth every 7 (seven) days.   No facility-administered encounter medications on  file as of 05/20/2020.   ALLERGIES: No Known Allergies VACCINATION STATUS: Immunization History  Administered Date(s) Administered  . Fluad Quad(high Dose 65+) 09/14/2019  . Influenza Split 09/24/2012  . Influenza Whole 10/04/2009, 09/29/2010, 09/17/2011  . Influenza,inj,Quad PF,6+ Mos 10/29/2013, 10/11/2014, 09/20/2015, 08/20/2016, 09/17/2017, 09/18/2018  . Pneumococcal Conjugate-13 03/17/2015  . Pneumococcal Polysaccharide-23 12/18/2016  . Td 03/24/2010  . Zoster 09/20/2011    HPI  69 yr old female with medical hx as above.  She is returning to follow-up for her osteoporosis.  She was initiated on Fosamax treatment in 2015.  She continues to tolerate this medication.  . She underwent previsit DEXA scan which generally shows significant  improvement from 2011-2021, in all areas scanned spine, hips, and femuri . She has had lumbar disc prolapse for which she underwent surgery in 2002 and 2009. She has a steady weight.  She is here for follow-up with no new complaints.    she has had menses until her early 83's. has 3 grown children. she denies parathyroid, thyroid dysfunction.  She denies history of fragility fracture.  Review of Systems Limited as above.  Objective:    BP 135/81 (BP Location: Right Arm, Patient Position: Sitting)   Pulse 69   Ht 5\' 2"  (1.575 m)   Wt 112 lb (50.8 kg)   BMI 20.49 kg/m   Wt Readings from Last 3 Encounters:  05/20/20 112 lb (50.8 kg)  10/13/19 111 lb 1.9 oz (50.4 kg)  06/24/19 110 lb (49.9 kg)    Physical Exam     Results for orders placed or performed in visit on 05/09/20  VITAMIN D 25 Hydroxy (Vit-D Deficiency, Fractures)  Result Value Ref Range   Vit D, 25-Hydroxy 26 (L) 30 - 100 ng/mL  Comprehensive metabolic panel  Result Value Ref Range   Glucose, Bld 87 65 - 99 mg/dL   BUN 15 7 - 25 mg/dL   Creat 0.70 0.50 - 0.99 mg/dL   BUN/Creatinine Ratio NOT APPLICABLE 6 - 22 (calc)   Sodium 140 135 - 146 mmol/L   Potassium 4.5 3.5 - 5.3 mmol/L   Chloride 103 98 - 110 mmol/L   CO2 28 20 - 32 mmol/L   Calcium 10.0 8.6 - 10.4 mg/dL   Total Protein 7.4 6.1 - 8.1 g/dL   Albumin 4.6 3.6 - 5.1 g/dL   Globulin 2.8 1.9 - 3.7 g/dL (calc)   AG Ratio 1.6 1.0 - 2.5 (calc)   Total Bilirubin 0.4 0.2 - 1.2 mg/dL   Alkaline phosphatase (APISO) 73 37 - 153 U/L   AST 19 10 - 35 U/L   ALT 16 6 - 29 U/L  TSH  Result Value Ref Range   TSH 2.14 0.40 - 4.50 mIU/L  T4, Free  Result Value Ref Range   Free T4 1.1 0.8 - 1.8 ng/dL   Complete Blood Count (Most recent): Lab Results  Component Value Date   WBC 4.7 03/07/2020   HGB 12.1 03/07/2020   HCT 38.5 03/07/2020   MCV 83.0 03/07/2020   PLT 255 03/07/2020   Chemistry (most recent): Lab Results  Component Value Date   NA 140  05/09/2020   K 4.5 05/09/2020   CL 103 05/09/2020   CO2 28 05/09/2020   BUN 15 05/09/2020   CREATININE 0.70 05/09/2020   Diabetic Labs (most recent): Lab Results  Component Value Date   HGBA1C 5.5 12/11/2016   HGBA1C 5.6 12/12/2015   HGBA1C 5.6 06/21/2015   Lipid profile (most recent):  Lab Results  Component Value Date   TRIG 107 03/07/2020   CHOL 184 03/07/2020     Assessment & Plan:   1. 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  Previsit DEXA scan from May 17, 2020 showed continued progressive improvement compared to her last DEXA scan readings from 2011-2021 in spine, hips, and femuri. She is tolerating Fosamax continue milligrams p.o. weekly.  She was started on Fosamax in 2015. -She has no interval fragility fracture.  This is  a good development for her since she has not lost significant bone density since she was started on Fosamax in 2015. I advised her to continue on same treatment program of Fosamax 70 mg weekly. Her PTH is normal along with calcium and TFTs, ruling out secondary cause of osteoporosis. She is made aware of the possibility of treating her with Fosamax for 5 to 8 years before considering drug holiday. Risk of untreated osteoporosis is discussed with her in detail.    2. Vitamin D deficiency -Her 25-hydroxy vitamin D level is dropping again.  She is going to be restarted on vitamin D2 50,000 units weekly along with her daily maintenance vitamin D3/calcium.  She will take the vitamin D2 for 90 days after which, she will continue with the combined calcium/vitamin D supplement.  I advised patient to maintain close follow up with their PCP for primary care needs.     - Time spent on this patient care encounter:  20 minutes of which 50% was spent in  counseling and the rest reviewing  her current and  previous labs / studies and medications  doses and developing a plan for long term care. Megan Gibbs  participated in  the discussions, expressed understanding, and voiced agreement with the above plans.  All questions were answered to her satisfaction. she is encouraged to contact clinic should she have any questions or concerns prior to her return visit.   Follow up plan: Return in about 1 year (around 05/20/2021) for F/U with Pre-visit Labs.  Glade Lloyd, MD Phone: [phone]  Fax: [phone]  -  This note was partially dictated with voice recognition software. Similar sounding words can be transcribed inadequately or may not  be corrected upon review.  05/20/2020, 12:50 PM

## 2020-06-21 ENCOUNTER — Ambulatory Visit (INDEPENDENT_AMBULATORY_CARE_PROVIDER_SITE_OTHER): Payer: PPO | Admitting: Family Medicine

## 2020-06-21 ENCOUNTER — Encounter: Payer: Self-pay | Admitting: Family Medicine

## 2020-06-21 ENCOUNTER — Other Ambulatory Visit: Payer: Self-pay

## 2020-06-21 VITALS — BP 130/71 | HR 70 | Temp 97.2°F | Resp 16 | Ht 62.0 in | Wt 111.1 lb

## 2020-06-21 DIAGNOSIS — M818 Other osteoporosis without current pathological fracture: Secondary | ICD-10-CM

## 2020-06-21 DIAGNOSIS — E785 Hyperlipidemia, unspecified: Secondary | ICD-10-CM | POA: Diagnosis not present

## 2020-06-21 DIAGNOSIS — J3089 Other allergic rhinitis: Secondary | ICD-10-CM | POA: Diagnosis not present

## 2020-06-21 NOTE — Patient Instructions (Addendum)
Annual physical exam with MD Oct 15 or after   Please get Shingrix vaccines at pharmacy if able  Increase calcium with D to one two times daily  Commit to daily walking/ exercise to help strengthen bones  Think about what you will eat, plan ahead. Choose " clean, green, fresh or frozen" over canned, processed or packaged foods which are more sugary, salty and fatty. 70 to 75% of food eaten should be vegetables and fruit. Three meals at set times with snacks allowed between meals, but they must be fruit or vegetables. Aim to eat over a 12 hour period , example 7 am to 7 pm, and STOP after  your last meal of the day. Drink water,generally about 64 ounces per day, no other drink is as healthy. Fruit juice is best enjoyed in a healthy way, by EATING the fruit.   Thanks for choosing [facility], we consider it a privelige to serve you.

## 2020-06-21 NOTE — Progress Notes (Signed)
   Megan Gibbs     MRN: [id number]      DOB: December 02, 1951   HPI Megan Gibbs is here for follow up and re-evaluation of chronic medical conditions, medication management and review of any available recent lab and radiology data.  Preventive health is updated, specifically  Cancer screening and Immunization.   Questions or concerns regarding consultations or procedures which the PT has had in the interim are  addressed. The PT denies any adverse reactions to current medications since the last visit.  There are no new concerns.  There are no specific complaints   ROS Denies recent fever or chills. Denies sinus pressure, nasal congestion, ear pain or sore throat. Denies chest congestion, productive cough or wheezing. Denies chest pains, palpitations and leg swelling Denies abdominal pain, nausea, vomiting,diarrhea or constipation.   Denies dysuria, frequency, hesitancy or incontinence. Denies joint pain, swelling and limitation in mobility. Denies headaches, seizures, numbness, or tingling. Denies depression, anxiety or insomnia. Denies skin break down or rash.   PE  BP 130/71   Pulse 70   Temp (!) 97.2 F (36.2 C) (Temporal)   Resp 16   Ht 5\' 2"  (1.575 m)   Wt 111 lb 1.9 oz (50.4 kg)   SpO2 98%   BMI 20.32 kg/m   Patient alert and oriented and in no cardiopulmonary distress.  HEENT: No facial asymmetry, EOMI,     Neck supple .  Chest: Clear to auscultation bilaterally.  CVS: S1, S2 no murmurs, no S3.Regular rate.  ABD: Soft non tender.   Ext: No edema  MS: Adequate ROM spine, shoulders, hips and knees.  Skin: Intact, no ulcerations or rash noted.  Psych: Good eye contact, normal affect. Memory intact not anxious or depressed appearing.  CNS: CN 2-12 intact, power,  normal throughout.no focal deficits noted.   Assessment & Plan  Allergic rhinitis Controlled no change in medication  Osteoporosis Treated by endocrinology and she will continue current  medication.  Dyslipidemia, goal LDL below 100 Hyperlipidemia:Low fat diet discussed and encouraged.   Lipid Panel  Lab Results  Component Value Date   CHOL 184 03/07/2020   HDL 59 03/07/2020   LDLCALC 105 (H) 03/07/2020   TRIG 107 03/07/2020   CHOLHDL 3.1 03/07/2020   Cholesterol management: Bad cholesterol mildly elevated at 105.  She will reduce fried and fatty food to get this to the goal  at 99 or less

## 2020-06-24 ENCOUNTER — Ambulatory Visit: Payer: PPO | Admitting: Family Medicine

## 2020-06-25 ENCOUNTER — Encounter: Payer: Self-pay | Admitting: Family Medicine

## 2020-06-25 NOTE — Assessment & Plan Note (Signed)
Hyperlipidemia:Low fat diet discussed and encouraged.   Lipid Panel  Lab Results  Component Value Date   CHOL 184 03/07/2020   HDL 59 03/07/2020   LDLCALC 105 (H) 03/07/2020   TRIG 107 03/07/2020   CHOLHDL 3.1 03/07/2020   Cholesterol management: Bad cholesterol mildly elevated at 105.  She will reduce fried and fatty food to get this to the goal  at 99 or less

## 2020-06-25 NOTE — Assessment & Plan Note (Signed)
Treated by endocrinology and she will continue current medication.

## 2020-06-25 NOTE — Assessment & Plan Note (Signed)
Controlled no change in medication

## 2020-07-18 ENCOUNTER — Other Ambulatory Visit: Payer: Self-pay

## 2020-07-18 ENCOUNTER — Telehealth (INDEPENDENT_AMBULATORY_CARE_PROVIDER_SITE_OTHER): Payer: PPO

## 2020-07-18 VITALS — BP 130/71 | Ht 62.0 in | Wt 111.0 lb

## 2020-07-18 DIAGNOSIS — Z0000 Encounter for general adult medical examination without abnormal findings: Secondary | ICD-10-CM | POA: Diagnosis not present

## 2020-07-18 NOTE — Progress Notes (Signed)
Subjective:   Megan Gibbs is a 69 y.o. female who presents for Medicare Annual (Subsequent) preventive examination.  Review of Systems     Cardiac Risk Factors include: advanced age (>30men, >5 women);dyslipidemia     Objective:    Today's Vitals   07/18/20 0949 07/18/20 1002  BP: 130/71   Weight: 111 lb (50.3 kg)   Height: 5\' 2"  (1.575 m)   PainSc:  0-No pain   Body mass index is 20.3 kg/m.  Advanced Directives 07/18/2020 06/18/2018 05/09/2017 12/30/2016 05/13/2015 12/01/2014 11/24/2014  Does Patient Have a Medical Advance Directive? No No No No No No No  Does patient want to make changes to medical advance directive? Yes (ED - Information included in AVS) - - - - - -  Would patient like information on creating a medical advance directive? - Yes (MAU/Ambulatory/Procedural Areas - Information given) Yes (MAU/Ambulatory/Procedural Areas - Information given) No - Patient declined No - patient declined information No - patient declined information No - patient declined information  Pre-existing out of facility DNR order (yellow form or pink MOST form) - - - - - - -    Current Medications (verified) Outpatient Encounter Medications as of 07/18/2020  Medication Sig  . alendronate (FOSAMAX) 70 MG tablet TAKE 1 TAB WEEKLY 30 MINS. BEFORE BREAKFAST WITH 8 OZ OF WATER FOR OSTEOPOROSIS.  [name] Calcium Carb-Cholecalciferol (CALCIUM 600+D3) 600-200 MG-UNIT TABS Take 1 tablet by mouth daily.   . cetirizine (ZYRTEC) 10 MG chewable tablet Chew 10 mg by mouth daily.   . Multiple Vitamin (MULTIVITAMIN) capsule Take 1 capsule by mouth daily.   [name] omega-3 fish oil (MAXEPA) 1000 MG CAPS capsule Take 2 capsules by mouth daily.   . Vitamin D, Ergocalciferol, (DRISDOL) 1.25 MG (50000 UNIT) CAPS capsule Take 1 capsule (50,000 Units total) by mouth every 7 (seven) days.   No facility-administered encounter medications on file as of 07/18/2020.    Allergies (verified) Patient has no known allergies.    History: Past Medical History:  Diagnosis Date  . Allergic rhinitis   . Anemia   . Anxiety   . Back pain   . Depression   . Hypokalemia   . Osteoporosis   . Prediabetes   . Seasonal allergies   . Vertigo   . Wears glasses    Past Surgical History:  Procedure Laterality Date  . BACK SURGERY  1992 & 2009   Dr. Joya Salm   . BREAST BIOPSY    . BREAST EXCISIONAL BIOPSY    . BREAST LUMPECTOMY WITH RADIOACTIVE SEED LOCALIZATION Right 12/01/2014   Procedure: RIGHT BREAST LUMPECTOMY WITH RADIOACTIVE SEED LOCALIZATION;  Surgeon: Fanny Skates, MD;  Location: [place];  Service: General;  Laterality: Right;  . COLONOSCOPY N/A 04/19/2014   Procedure: COLONOSCOPY;  Surgeon: Daneil Dolin, MD;  [facility];  Service: Endoscopy;  Laterality: N/A;  9:30 AM  . DILATION AND CURETTAGE OF UTERUS    . TUBAL LIGATION     1976   Family History  Problem Relation Age of Onset  . Diabetes Mother   . Hypertension Mother   . Asthma Sister   . Thyroid disease Sister   . COPD Sister   . Anemia Sister   . Diabetes Sister   . Hypertension Sister   . Diabetes Sister   . Schizophrenia Father   . Drug abuse Brother   . Alcohol abuse Brother   . Sleep apnea Grandchild   . ADD / ADHD  Grandchild   . Seizures Grandchild   . Breast cancer Sister 52  . Thyroid disease Sister   . Hypertension Brother   . Anxiety disorder Maternal Aunt   . Hypertension Daughter   . Anemia Daughter   . Hypertension Daughter   . Asthma Daughter   . Colon cancer Neg Hx    Social History   Socioeconomic History  . Marital status: Divorced    Spouse name: Not on file  . Number of children: 3  . Years of education: Not on file  . Highest education level: Not on file  Occupational History  . Occupation: disabled     Fish farm manager: UNEMPLOYED  Tobacco Use  . Smoking status: Never Smoker  . Smokeless tobacco: Never Used  Substance and Sexual Activity  . Alcohol use: No  . Drug use: No   . Sexual activity: Yes    Birth control/protection: Post-menopausal  Other Topics Concern  . Not on file  Social History Narrative   DOING A LOT OF [place].    Social Determinants of Health   Financial Resource Strain:   . Difficulty of Paying Living Expenses:   Food Insecurity:   . Worried About Charity fundraiser in the Last Year:   . Arboriculturist in the Last Year:   Transportation Needs: No Transportation Needs  . Lack of Transportation (Medical): No  . Lack of Transportation (Non-Medical): No  Physical Activity: Sufficiently Active  . Days of Exercise per Week: 5 days  . Minutes of Exercise per Session: 30 min  Stress:   . Feeling of Stress :   Social Connections: Moderately Integrated  . Frequency of Communication with Friends and Family: More than three times a week  . Frequency of Social Gatherings with Friends and Family: More than three times a week  . Attends Religious Services: More than 4 times per year  . Active Member of Clubs or Organizations: Yes  . Attends Archivist Meetings: Never  . Marital Status: Widowed    Tobacco Counseling Counseling given: Not Answered   Clinical Intake:  Pre-visit preparation completed: Yes  Pain : No/denies pain Pain Score: 0-No pain     Nutritional Status: BMI of 19-24  Normal Diabetes: No  How often do you need to have someone help you when you read instructions, pamphlets, or other written materials from your doctor or pharmacy?: 2 - Rarely  Diabetic? no     Information entered by :: Joslynn Jamroz LPN   Activities of Daily Living In your present state of health, do you have any difficulty performing the following activities: 07/18/2020  Hearing? N  Vision? N  Difficulty concentrating or making decisions? N  Walking or climbing stairs? N  Dressing or bathing? N  Doing errands, shopping? N  Preparing Food and eating ? N  Using the Toilet? N  In the past six months, have you  accidently leaked urine? N  Do you have problems with loss of bowel control? N  Managing your Medications? N  Managing your Finances? N  Housekeeping or managing your Housekeeping? N  Some recent data might be hidden    Patient Care Team: Fayrene Helper, MD as PCP - General Rourk, Cristopher Estimable, MD as Consulting Physician (Gastroenterology) Lay, Bobbye Charleston, MD (Psychiatry) Cassandria Anger, MD as Consulting Physician (Endocrinology) Madelin Headings, DO as Consulting Physician (Optometry)  Indicate any recent Medical Services you may have received from other than [facility] providers  in the past year (date may be approximate).     Assessment:   This is a routine wellness examination for [facility].  Hearing/Vision screen No exam data present  Dietary issues and exercise activities discussed: Current Exercise Habits: Home exercise routine, Type of exercise: treadmill, Time (Minutes): 30, Frequency (Times/Week): 5, Weekly Exercise (Minutes/Week): 150, Intensity: Moderate, Exercise limited by: None identified  Goals    . DIET - INCREASE WATER INTAKE    . Exercise 3x per week (30 min per time)     Continue to walk 30 mins on treadmill 5 days a week       Depression Screen PHQ 2/9 Scores 07/18/2020 06/21/2020 10/13/2019 06/24/2019 10/29/2018 07/10/2018 06/18/2018  PHQ - 2 Score 0 0 0 0 3 0 0  PHQ- 9 Score - - - - 8 0 -    Fall Risk Fall Risk  07/18/2020 06/21/2020 10/13/2019 06/24/2019 03/30/2019  Falls in the past year? 0 0 0 0 0  Number falls in past yr: 0 0 0 - 0  Injury with Fall? 0 0 0 0 0    Any stairs in or around the home? No  If so, are there any without handrails? Yes  Home free of loose throw rugs in walkways, pet beds, electrical cords, etc? Yes  Adequate lighting in your home to reduce risk of falls? Yes   ASSISTIVE DEVICES UTILIZED TO PREVENT FALLS:  Life alert? No  Use of a cane, walker or w/c? No  Grab bars in the bathroom? No  Shower chair or bench in shower? No    Elevated toilet seat or a handicapped toilet? No   TIMED UP AND GO:  Was the test performed? No .  Length of time to ambulate 10 feet:  sec.     Cognitive Function:     6CIT Screen 07/18/2020 06/24/2019 06/18/2018 05/09/2017  What Year? 0 points 0 points 0 points 0 points  What month? 0 points 0 points 0 points 0 points  What time? 0 points 0 points 0 points 0 points  Count back from 20 0 points 0 points 0 points 0 points  Months in reverse 0 points 0 points 0 points 0 points  Repeat phrase 0 points 2 points 2 points 0 points  Total Score 0 2 2 0    Immunizations Immunization History  Administered Date(s) Administered  . Fluad Quad(high Dose 65+) 09/14/2019  . Influenza Split 09/24/2012  . Influenza Whole 10/04/2009, 09/29/2010, 09/17/2011  . Influenza,inj,Quad PF,6+ Mos 10/29/2013, 10/11/2014, 09/20/2015, 08/20/2016, 09/17/2017, 09/18/2018  . Moderna SARS-COVID-2 Vaccination 03/09/2020, 04/10/2020  . Pneumococcal Conjugate-13 03/17/2015  . Pneumococcal Polysaccharide-23 12/18/2016  . Td 03/24/2010  . Zoster 09/20/2011    TDAP status: Due, Education has been provided regarding the importance of this vaccine. Advised may receive this vaccine at local pharmacy or Health Dept. Aware to provide a copy of the vaccination record if obtained from local pharmacy or Health Dept. Verbalized acceptance and understanding. Flu Vaccine status: Up to date Pneumococcal vaccine status: Up to date Covid-19 vaccine status: Completed vaccines  Qualifies for Shingles Vaccine? Yes   Zostavax completed Yes   Shingrix Completed?: No.    Education has been provided regarding the importance of this vaccine. Patient has been advised to call insurance company to determine out of pocket expense if they have not yet received this vaccine. Advised may also receive vaccine at local pharmacy or Health Dept. Verbalized acceptance and understanding.  Screening Tests Health Maintenance  Topic Date  Due  .  TETANUS/TDAP  03/24/2020  . INFLUENZA VACCINE  07/31/2020  . MAMMOGRAM  11/10/2021  . COLONOSCOPY  04/19/2024  . DEXA SCAN  Completed  . COVID-19 Vaccine  Completed  . Hepatitis C Screening  Completed  . PNA vac Low Risk Adult  Completed    Health Maintenance  Health Maintenance Due  Topic Date Due  . TETANUS/TDAP  03/24/2020    Colorectal cancer screening: Completed 2015. Repeat every 10 years Mammogram status: Completed 11/2019. Repeat every year Bone Density status: Completed 2021. Results reflect: Bone density results: OSTEOPOROSIS. Repeat every 2 years.  Lung Cancer Screening: (Low Dose CT Chest recommended if Age 44-80 years, 30 pack-year currently smoking OR have quit w/in 15years.) does not qualify.   Lung Cancer Screening Referral: no   Additional Screening:  Hepatitis C Screening: does qualify; Completed   Vision Screening: Recommended annual ophthalmology exams for early detection of glaucoma and other disorders of the eye. Is the patient up to date with their annual eye exam?  Yes  Who is the provider or what is the name of the office in which the patient attends annual eye exams? My eye dr in [place]  If pt is not established with a provider, would they like to be referred to a provider to establish care? No .   Dental Screening: Recommended annual dental exams for proper oral hygiene  Community Resource Referral / Chronic Care Management: CRR required this visit?  No   CCM required this visit?  No      Plan:     I have personally reviewed and noted the following in the patient's chart:   . Medical and social history . Use of alcohol, tobacco or illicit drugs  . Current medications and supplements . Functional ability and status . Nutritional status . Physical activity . Advanced directives . List of other physicians . Hospitalizations, surgeries, and ER visits in previous 12 months . Vitals . Screenings to include cognitive, depression, and  falls . Referrals and appointments  In addition, I have reviewed and discussed with patient certain preventive protocols, quality metrics, and best practice recommendations. A written personalized care plan for preventive services as well as general preventive health recommendations were provided to patient.     Kate Sable, LPN, LPN   05/02/5464   Nurse Notes: Visit done by telephone. Patient at home and provider in the office. Time spent with patient- 35 mins

## 2020-07-18 NOTE — Patient Instructions (Signed)
Megan Gibbs , Thank you for taking time to come for your Medicare Wellness Visit. I appreciate your ongoing commitment to your health goals. Please review the following plan we discussed and let me know if I can assist you in the future.   Screening recommendations/referrals: Colonoscopy: up to date  Mammogram: up to date  Bone Density: up to date  Recommended yearly ophthalmology/optometry visit for glaucoma screening and checkup Recommended yearly dental visit for hygiene and checkup  Vaccinations: Influenza vaccine: up to date Pneumococcal vaccine: up to date  Tdap vaccine: not covered as a preventative vaccine Shingles vaccine: Can get Shingrix at pharmacy if wanted  Advanced directives: form mailed    Next appointment: wellness in 1 year   Preventive Care 52 Years and Older, Female Preventive care refers to lifestyle choices and visits with your health care provider that can promote health and wellness. What does preventive care include?  A yearly physical exam. This is also called an annual well check.  Dental exams once or twice a year.  Routine eye exams. Ask your health care provider how often you should have your eyes checked.  Personal lifestyle choices, including:  Daily care of your teeth and gums.  Regular physical activity.  Eating a healthy diet.  Avoiding tobacco and drug use.  Limiting alcohol use.  Practicing safe sex.  Taking low-dose aspirin every day.  Taking vitamin and mineral supplements as recommended by your health care provider. What happens during an annual well check? The services and screenings done by your health care provider during your annual well check will depend on your age, overall health, lifestyle risk factors, and family history of disease. Counseling  Your health care provider may ask you questions about your:  Alcohol use.  Tobacco use.  Drug use.  Emotional well-being.  Home and relationship well-being.  Sexual  activity.  Eating habits.  History of falls.  Memory and ability to understand (cognition).  Work and work Statistician.  Reproductive health. Screening  You may have the following tests or measurements:  Height, weight, and BMI.  Blood pressure.  Lipid and cholesterol levels. These may be checked every 5 years, or more frequently if you are over 85 years old.  Skin check.  Lung cancer screening. You may have this screening every year starting at age 74 if you have a 30-pack-year history of smoking and currently smoke or have quit within the past 15 years.  Fecal occult blood test (FOBT) of the stool. You may have this test every year starting at age 32.  Flexible sigmoidoscopy or colonoscopy. You may have a sigmoidoscopy every 5 years or a colonoscopy every 10 years starting at age 35.  Hepatitis C blood test.  Hepatitis B blood test.  Sexually transmitted disease (STD) testing.  Diabetes screening. This is done by checking your blood sugar (glucose) after you have not eaten for a while (fasting). You may have this done every 1-3 years.  Bone density scan. This is done to screen for osteoporosis. You may have this done starting at age 37.  Mammogram. This may be done every 1-2 years. Talk to your health care provider about how often you should have regular mammograms. Talk with your health care provider about your test results, treatment options, and if necessary, the need for more tests. Vaccines  Your health care provider may recommend certain vaccines, such as:  Influenza vaccine. This is recommended every year.  Tetanus, diphtheria, and acellular pertussis (Tdap, Td) vaccine. You  may need a Td booster every 10 years.  Zoster vaccine. You may need this after age 88.  Pneumococcal 13-valent conjugate (PCV13) vaccine. One dose is recommended after age 67.  Pneumococcal polysaccharide (PPSV23) vaccine. One dose is recommended after age 47. Talk to your health care  provider about which screenings and vaccines you need and how often you need them. This information is not intended to replace advice given to you by your health care provider. Make sure you discuss any questions you have with your health care provider. Document Released: 01/13/2016 Document Revised: 09/05/2016 Document Reviewed: 10/18/2015 Elsevier Interactive Patient Education  2017 [employer] Prevention in the Home Falls can cause injuries. They can happen to people of all ages. There are many things you can do to make your home safe and to help prevent falls. What can I do on the outside of my home?  Regularly fix the edges of walkways and driveways and fix any cracks.  Remove anything that might make you trip as you walk through a door, such as a raised step or threshold.  Trim any bushes or trees on the path to your home.  Use bright outdoor lighting.  Clear any walking paths of anything that might make someone trip, such as rocks or tools.  Regularly check to see if handrails are loose or broken. Make sure that both sides of any steps have handrails.  Any raised decks and porches should have guardrails on the edges.  Have any leaves, snow, or ice cleared regularly.  Use sand or salt on walking paths during winter.  Clean up any spills in your garage right away. This includes oil or grease spills. What can I do in the bathroom?  Use night lights.  Install grab bars by the toilet and in the tub and shower. Do not use towel bars as grab bars.  Use non-skid mats or decals in the tub or shower.  If you need to sit down in the shower, use a plastic, non-slip stool.  Keep the floor dry. Clean up any water that spills on the floor as soon as it happens.  Remove soap buildup in the tub or shower regularly.  Attach bath mats securely with double-sided non-slip rug tape.  Do not have throw rugs and other things on the floor that can make you trip. What can I do in  the bedroom?  Use night lights.  Make sure that you have a light by your bed that is easy to reach.  Do not use any sheets or blankets that are too big for your bed. They should not hang down onto the floor.  Have a firm chair that has side arms. You can use this for support while you get dressed.  Do not have throw rugs and other things on the floor that can make you trip. What can I do in the kitchen?  Clean up any spills right away.  Avoid walking on wet floors.  Keep items that you use a lot in easy-to-reach places.  If you need to reach something above you, use a strong step stool that has a grab bar.  Keep electrical cords out of the way.  Do not use floor polish or wax that makes floors slippery. If you must use wax, use non-skid floor wax.  Do not have throw rugs and other things on the floor that can make you trip. What can I do with my stairs?  Do not leave any items  on the stairs.  Make sure that there are handrails on both sides of the stairs and use them. Fix handrails that are broken or loose. Make sure that handrails are as long as the stairways.  Check any carpeting to make sure that it is firmly attached to the stairs. Fix any carpet that is loose or worn.  Avoid having throw rugs at the top or bottom of the stairs. If you do have throw rugs, attach them to the floor with carpet tape.  Make sure that you have a light switch at the top of the stairs and the bottom of the stairs. If you do not have them, ask someone to add them for you. What else can I do to help prevent falls?  Wear shoes that:  Do not have high heels.  Have rubber bottoms.  Are comfortable and fit you well.  Are closed at the toe. Do not wear sandals.  If you use a stepladder:  Make sure that it is fully opened. Do not climb a closed stepladder.  Make sure that both sides of the stepladder are locked into place.  Ask someone to hold it for you, if possible.  Clearly mark and  make sure that you can see:  Any grab bars or handrails.  First and last steps.  Where the edge of each step is.  Use tools that help you move around (mobility aids) if they are needed. These include:  Canes.  Walkers.  Scooters.  Crutches.  Turn on the lights when you go into a dark area. Replace any light bulbs as soon as they burn out.  Set up your furniture so you have a clear path. Avoid moving your furniture around.  If any of your floors are uneven, fix them.  If there are any pets around you, be aware of where they are.  Review your medicines with your doctor. Some medicines can make you feel dizzy. This can increase your chance of falling. Ask your doctor what other things that you can do to help prevent falls. This information is not intended to replace advice given to you by your health care provider. Make sure you discuss any questions you have with your health care provider. Document Released: 10/13/2009 Document Revised: 05/24/2016 Document Reviewed: 01/21/2015 Elsevier Interactive Patient Education  2017 [employer].

## 2020-10-10 ENCOUNTER — Other Ambulatory Visit (HOSPITAL_COMMUNITY): Payer: Self-pay | Admitting: Family Medicine

## 2020-10-10 DIAGNOSIS — Z1231 Encounter for screening mammogram for malignant neoplasm of breast: Secondary | ICD-10-CM

## 2020-10-24 ENCOUNTER — Ambulatory Visit (INDEPENDENT_AMBULATORY_CARE_PROVIDER_SITE_OTHER): Payer: PPO | Admitting: Family Medicine

## 2020-10-24 ENCOUNTER — Encounter: Payer: Self-pay | Admitting: Family Medicine

## 2020-10-24 ENCOUNTER — Other Ambulatory Visit: Payer: Self-pay

## 2020-10-24 VITALS — BP 124/78 | HR 81 | Resp 16 | Ht 62.0 in | Wt 107.1 lb

## 2020-10-24 DIAGNOSIS — Z0001 Encounter for general adult medical examination with abnormal findings: Secondary | ICD-10-CM | POA: Diagnosis not present

## 2020-10-24 DIAGNOSIS — F409 Phobic anxiety disorder, unspecified: Secondary | ICD-10-CM

## 2020-10-24 DIAGNOSIS — F5105 Insomnia due to other mental disorder: Secondary | ICD-10-CM | POA: Diagnosis not present

## 2020-10-24 DIAGNOSIS — J3089 Other allergic rhinitis: Secondary | ICD-10-CM | POA: Diagnosis not present

## 2020-10-24 DIAGNOSIS — Z23 Encounter for immunization: Secondary | ICD-10-CM | POA: Diagnosis not present

## 2020-10-24 MED ORDER — FLUTICASONE PROPIONATE 50 MCG/ACT NA SUSP
2.0000 | Freq: Every day | NASAL | 2 refills | Status: DC
Start: 2020-10-24 — End: 2023-06-26

## 2020-10-24 MED ORDER — MIRTAZAPINE 7.5 MG PO TABS
7.5000 mg | ORAL_TABLET | Freq: Every day | ORAL | 3 refills | Status: DC
Start: 2020-10-24 — End: 2021-01-24

## 2020-10-24 MED ORDER — MONTELUKAST SODIUM 10 MG PO TABS
10.0000 mg | ORAL_TABLET | Freq: Every day | ORAL | 3 refills | Status: DC
Start: 2020-10-24 — End: 2021-01-24

## 2020-10-24 NOTE — Patient Instructions (Addendum)
In 3 months with MD, call if you need me before  Flu vaccine today  Montelukast and flonase are prescribed for allergies  Remeron is prescribed for sleep, and mild anxiety   Thanks for choosing [facility], we consider it a privelige to serve you.  Thanks for choosing [facility], we consider it a privelige to serve you.

## 2020-10-24 NOTE — Progress Notes (Signed)
    Megan Gibbs     MRN: [id number]      DOB: 08/21/1951  HPI: Patient is in for annual physical exam. Increased allergy symptoms, nasal congestion and post nasal drainage C/o poor sleep and mild anxiety, has benefited in the past from Remeron and wants to resume Recent labs, if available are reviewed. Immunization is reviewed , and  updated   PE: BP 124/78   Pulse 81   Resp 16   Ht 5\' 2"  (1.575 m)   Wt 107 lb 1.9 oz (48.6 kg)   SpO2 99%   BMI 19.59 kg/m   Pleasant  female, alert and oriented x 3, in no cardio-pulmonary distress. Afebrile. HEENT No facial trauma or asymetry.mild sinuses tenderness.  Extra occullar muscles intact.. External ears normal, . Neck: supple, no adenopathy,JVD or thyromegaly.No bruits.  Chest: Clear to ascultation bilaterally.No crackles or wheezes. Non tender to palpation  Breast: No asymetry,no masses or lumps. No tenderness. No nipple discharge or inversion. No axillary or supraclavicular adenopathy  Cardiovascular system; Heart sounds normal,  S1 and  S2 ,no S3.  No murmur, or thrill. Apical beat not displaced Peripheral pulses normal.  Abdomen: Soft, non tender, no organomegaly or masses. No bruits. Bowel sounds normal. No guarding, tenderness or rebound.     Musculoskeletal exam: Full ROM of spine, hips , shoulders and knees. No deformity ,swelling or crepitus noted. No muscle wasting or atrophy.   Neurologic: Cranial nerves 2 to 12 intact. Power, tone ,sensation and reflexes normal throughout. No disturbance in gait. No tremor.  Skin: Intact, no ulceration, erythema , scaling or rash noted. Pigmentation normal throughout  Psych; Mildly anxious, not depressed. Judgement and concentration normal   Assessment & Plan:  Allergic rhinitis Uncontrolled, singulair and flonase prescribed, encouraged saline nasal flush also  Insomnia due to anxiety and fear Sleep hygiene reviewed and written information offered  also. Prescription sent for  medication needed. Start remeron  Encounter for annual general medical examination with abnormal findings in adult Annual exam as documented. Counseling done  re healthy lifestyle involving commitment to 150 minutes exercise per week, heart healthy diet, and attaining healthy weight.The importance of adequate sleep also discussed. Regular seat belt use and home safety, is also discussed. Changes in health habits are decided on by the patient with goals and time frames  set for achieving them. Immunization and cancer screening needs are specifically addressed at this visit.

## 2020-10-29 ENCOUNTER — Encounter: Payer: Self-pay | Admitting: Family Medicine

## 2020-10-29 DIAGNOSIS — Z0001 Encounter for general adult medical examination with abnormal findings: Secondary | ICD-10-CM | POA: Insufficient documentation

## 2020-10-29 NOTE — Assessment & Plan Note (Signed)
Sleep hygiene reviewed and written information offered also. Prescription sent for  medication needed. Start remeron

## 2020-10-29 NOTE — Assessment & Plan Note (Signed)

## 2020-10-29 NOTE — Assessment & Plan Note (Signed)
Uncontrolled, singulair and flonase prescribed, encouraged saline nasal flush also

## 2020-11-14 ENCOUNTER — Ambulatory Visit (HOSPITAL_COMMUNITY): Payer: PPO

## 2020-12-26 ENCOUNTER — Other Ambulatory Visit: Payer: Self-pay

## 2020-12-26 ENCOUNTER — Ambulatory Visit (HOSPITAL_COMMUNITY)
Admission: RE | Admit: 2020-12-26 | Discharge: 2020-12-26 | Disposition: A | Discharge status: Home / Self Care | Payer: PPO | Source: Ambulatory Visit | Attending: Family Medicine | Admitting: Family Medicine

## 2020-12-26 ENCOUNTER — Encounter (HOSPITAL_COMMUNITY): Payer: Self-pay

## 2020-12-26 DIAGNOSIS — Z1231 Encounter for screening mammogram for malignant neoplasm of breast: Secondary | ICD-10-CM | POA: Diagnosis not present

## 2021-01-24 ENCOUNTER — Other Ambulatory Visit: Payer: Self-pay

## 2021-01-24 ENCOUNTER — Telehealth (INDEPENDENT_AMBULATORY_CARE_PROVIDER_SITE_OTHER): Payer: Medicare HMO | Admitting: Family Medicine

## 2021-01-24 ENCOUNTER — Encounter: Payer: Self-pay | Admitting: Family Medicine

## 2021-01-24 VITALS — Ht 62.0 in | Wt 105.0 lb

## 2021-01-24 DIAGNOSIS — R630 Anorexia: Secondary | ICD-10-CM

## 2021-01-24 DIAGNOSIS — F5105 Insomnia due to other mental disorder: Secondary | ICD-10-CM | POA: Diagnosis not present

## 2021-01-24 DIAGNOSIS — F409 Phobic anxiety disorder, unspecified: Secondary | ICD-10-CM

## 2021-01-24 DIAGNOSIS — R69 Illness, unspecified: Secondary | ICD-10-CM | POA: Diagnosis not present

## 2021-01-24 DIAGNOSIS — E785 Hyperlipidemia, unspecified: Secondary | ICD-10-CM | POA: Diagnosis not present

## 2021-01-24 MED ORDER — ALENDRONATE SODIUM 70 MG PO TABS: ORAL_TABLET | ORAL | 1 refills | Status: DC

## 2021-01-24 MED ORDER — MONTELUKAST SODIUM 10 MG PO TABS: 10.0000 mg | ORAL_TABLET | Freq: Every day | ORAL | 1 refills | Status: DC

## 2021-01-24 MED ORDER — MIRTAZAPINE 7.5 MG PO TABS: 7.5000 mg | ORAL_TABLET | Freq: Every day | ORAL | 1 refills | Status: DC

## 2021-01-24 MED ORDER — MEGESTROL ACETATE 40 MG PO TABS: 40.0000 mg | ORAL_TABLET | Freq: Every day | ORAL | 1 refills | Status: DC

## 2021-01-24 NOTE — Patient Instructions (Addendum)
F/u with MD, video visit  in 7 weeks, call if you need me sooner, please have weight for the visit  New additional medication for appetite is megace 40 mg one daily  Continue Remeron as before  I Hope that yuor daughter's health continues to improve  Thanks for choosing [facility], we consider it a privelige to serve you.

## 2021-01-24 NOTE — Progress Notes (Signed)
Virtual Visit via Telephone Note  I connected with Megan Gibbs on 01/24/21 at 10:40 AM EST by telephone and verified that I am speaking with the correct person using two identifiers.  Location: Patient: home Provider: work   I discussed the limitations, risks, security and privacy concerns of performing an evaluation and management service by telephone and the availability of in person appointments. I also discussed with the patient that there may be a patient responsible charge related to this service. The patient expressed understanding and agreed to proceed.   History of Present Illness: F/U anxiety , poor appetite and weight loss Reports improvement I anxiety and sleep with remeron with no adverse s/e , however she notes poor appetite and weight loss persist. Increased stress since her daughter is being treated for newly diagnosed breast cancer, she does report that she is doing well with treatment, which is wonderful   Observations/Objective: Ht 5\' 2"  (1.575 m)   Wt 105 lb (47.6 kg)   BMI 19.20 kg/m   Good communication with no confusion and intact memory. Alert and oriented x 3 No signs of respiratory distress during speech    Assessment and Plan: Poor appetite Poor appetite with weight loss and under weight, start megace 40  Mg daily x 8 weeks, and re evaluate  Insomnia due to anxiety and fear Sleep hygiene reviewed and written information offered also. Prescription sent for  medication needed. Controlled, no change in medication   Dyslipidemia, goal LDL below 100 Hyperlipidemia:Low fat diet discussed and encouraged.   Lipid Panel  Lab Results  Component Value Date   CHOL 184 03/07/2020   HDL 59 03/07/2020   LDLCALC 105 (H) 03/07/2020   TRIG 107 03/07/2020   CHOLHDL 3.1 03/07/2020   Needs to reduce fat in diet  '    Follow Up Instructions:    I discussed the assessment and treatment plan with the patient. The patient was provided an opportunity to ask  questions and all were answered. The patient agreed with the plan and demonstrated an understanding of the instructions.   The patient was advised to call back or seek an in-person evaluation if the symptoms worsen or if the condition fails to improve as anticipated.  I provided 14 minutes of non-face-to-face time during this encounter.   Tula Nakayama, MD

## 2021-01-24 NOTE — Assessment & Plan Note (Signed)
Hyperlipidemia:Low fat diet discussed and encouraged.   Lipid Panel  Lab Results  Component Value Date   CHOL 184 03/07/2020   HDL 59 03/07/2020   LDLCALC 105 (H) 03/07/2020   TRIG 107 03/07/2020   CHOLHDL 3.1 03/07/2020   Needs to reduce fat in diet  '

## 2021-01-24 NOTE — Assessment & Plan Note (Signed)
Sleep hygiene reviewed and written information offered also. Prescription sent for  medication needed. Controlled, no change in medication  

## 2021-01-24 NOTE — Assessment & Plan Note (Signed)
Poor appetite with weight loss and under weight, start megace 40  Mg daily x 8 weeks, and re evaluate

## 2021-01-30 ENCOUNTER — Other Ambulatory Visit: Payer: Self-pay | Admitting: Family Medicine

## 2021-03-03 ENCOUNTER — Other Ambulatory Visit: Payer: Self-pay | Admitting: Family Medicine

## 2021-03-15 ENCOUNTER — Telehealth: Payer: Medicare HMO | Admitting: Family Medicine

## 2021-03-20 ENCOUNTER — Telehealth: Payer: Medicare HMO | Admitting: Family Medicine

## 2021-03-21 ENCOUNTER — Telehealth (INDEPENDENT_AMBULATORY_CARE_PROVIDER_SITE_OTHER): Payer: Medicare HMO | Admitting: Nurse Practitioner

## 2021-03-21 ENCOUNTER — Encounter: Payer: Self-pay | Admitting: Nurse Practitioner

## 2021-03-21 ENCOUNTER — Other Ambulatory Visit: Payer: Self-pay

## 2021-03-21 DIAGNOSIS — F409 Phobic anxiety disorder, unspecified: Secondary | ICD-10-CM | POA: Diagnosis not present

## 2021-03-21 DIAGNOSIS — R69 Illness, unspecified: Secondary | ICD-10-CM | POA: Diagnosis not present

## 2021-03-21 DIAGNOSIS — R630 Anorexia: Secondary | ICD-10-CM | POA: Diagnosis not present

## 2021-03-21 DIAGNOSIS — F419 Anxiety disorder, unspecified: Secondary | ICD-10-CM

## 2021-03-21 DIAGNOSIS — F5105 Insomnia due to other mental disorder: Secondary | ICD-10-CM

## 2021-03-21 MED ORDER — BUSPIRONE HCL 7.5 MG PO TABS: 7.5000 mg | ORAL_TABLET | Freq: Two times a day (BID) | ORAL | 1 refills | Status: DC

## 2021-03-21 MED ORDER — MIRTAZAPINE 7.5 MG PO TABS: 7.5000 mg | ORAL_TABLET | Freq: Every day | ORAL | 0 refills | Status: DC

## 2021-03-21 MED ORDER — MEGESTROL ACETATE 40 MG PO TABS: 40.0000 mg | ORAL_TABLET | Freq: Every day | ORAL | 0 refills | Status: DC

## 2021-03-21 NOTE — Progress Notes (Signed)
Acute Office Visit  Subjective:    Patient ID: Megan Gibbs, female    DOB: 06/23/1951, 70 y.o.   MRN: [id number]  Chief Complaint  Patient presents with  . Weight Check  . Insomnia    Still having trouble staying asleep because the night med makes her drowsy into the next day and she just wants to stay in the bed during the day  . Anxiety    States she stays very nervous and has what she thinks are panic attacks when she drives     HPI Patient is in today for weight check.  She was recently started on megace. Since starting this on 01/24/21, she has gained 7 pounds. She has also been using ensure.  Daughter is undergoing chemo treatments for cancer, and concerns for her daughter's health has the patient nervous. She states that she is staying up at night, and having a hard time getting out of the bed, and will sleep more during the day.  Wt Readings from Last 3 Encounters:  03/21/21 112 lb (50.8 kg)  01/24/21 105 lb (47.6 kg)  10/24/20 107 lb 1.9 oz (48.6 kg)     Past Medical History:  Diagnosis Date  . Allergic rhinitis   . Anemia   . Anxiety   . Back pain   . Depression   . Hypokalemia   . Osteoporosis   . Prediabetes   . Seasonal allergies   . Vertigo   . Wears glasses     Past Surgical History:  Procedure Laterality Date  . BACK SURGERY  1992 & 2009   Dr. Joya Salm   . BREAST BIOPSY    . BREAST EXCISIONAL BIOPSY    . BREAST LUMPECTOMY WITH RADIOACTIVE SEED LOCALIZATION Right 12/01/2014   Procedure: RIGHT BREAST LUMPECTOMY WITH RADIOACTIVE SEED LOCALIZATION;  Surgeon: Fanny Skates, MD;  Location: [facility];  Service: General;  Laterality: Right;  . COLONOSCOPY N/A 04/19/2014   Procedure: COLONOSCOPY;  Surgeon: Daneil Dolin, MD;  [facility];  Service: Endoscopy;  Laterality: N/A;  9:30 AM  . DILATION AND CURETTAGE OF UTERUS    . TUBAL LIGATION     1976    Family History  Problem Relation Age of Onset  . Diabetes Mother   .  Hypertension Mother   . Asthma Sister   . Thyroid disease Sister   . COPD Sister   . Anemia Sister   . Diabetes Sister   . Hypertension Sister   . Diabetes Sister   . Schizophrenia Father   . Drug abuse Brother   . Alcohol abuse Brother   . Sleep apnea Grandchild   . ADD / ADHD Grandchild   . Seizures Grandchild   . Breast cancer Sister 5  . Thyroid disease Sister   . Hypertension Brother   . Anxiety disorder Maternal Aunt   . Hypertension Daughter   . BRCA 1/2 Daughter   . Anemia Daughter   . Hypertension Daughter   . Asthma Daughter   . Colon cancer Neg Hx     Social History   Socioeconomic History  . Marital status: Divorced    Spouse name: Not on file  . Number of children: 3  . Years of education: Not on file  . Highest education level: Not on file  Occupational History  . Occupation: disabled     Fish farm manager: UNEMPLOYED  Tobacco Use  . Smoking status: Never Smoker  . Smokeless tobacco: Never Used  Substance and Sexual Activity  . Alcohol use: No  . Drug use: No  . Sexual activity: Yes    Birth control/protection: Post-menopausal  Other Topics Concern  . Not on file  Social History Narrative   DOING A LOT OF [place].    Social Determinants of Health   Financial Resource Strain: Not on file  Food Insecurity: Not on file  Transportation Needs: No Transportation Needs  . Lack of Transportation (Medical): No  . Lack of Transportation (Non-Medical): No  Physical Activity: Sufficiently Active  . Days of Exercise per Week: 5 days  . Minutes of Exercise per Session: 30 min  Stress: Not on file  Social Connections: Moderately Integrated  . Frequency of Communication with Friends and Family: More than three times a week  . Frequency of Social Gatherings with Friends and Family: More than three times a week  . Attends Religious Services: More than 4 times per year  . Active Member of Clubs or Organizations: Yes  . Attends Theatre manager Meetings: Never  . Marital Status: Widowed  Intimate Partner Violence: Not At Risk  . Fear of Current or Ex-Partner: No  . Emotionally Abused: No  . Physically Abused: No  . Sexually Abused: No    Outpatient Medications Prior to Visit  Medication Sig Dispense Refill  . alendronate (FOSAMAX) 70 MG tablet TAKE 1 TAB WEEKLY 30 MINS. BEFORE BREAKFAST WITH 8 OZ OF WATER FOR OSTEOPOROSIS. 12 tablet 1  . Calcium Carb-Cholecalciferol 600-200 MG-UNIT TABS Take 1 tablet by mouth daily.     . fluticasone (FLONASE) 50 MCG/ACT nasal spray Place 2 sprays into both nostrils daily. 16 g 2  . montelukast (SINGULAIR) 10 MG tablet TAKE 1 TABLET BY MOUTH AT BEDTIME. 30 tablet 0  . Multiple Vitamin (MULTIVITAMIN) capsule Take 1 capsule by mouth daily.     [name] omega-3 fish oil (MAXEPA) 1000 MG CAPS capsule Take 2 capsules by mouth daily.     . megestrol (MEGACE) 40 MG tablet TAKE 1 TABLET BY MOUTH ONCE DAILY. 30 tablet 0  . mirtazapine (REMERON) 7.5 MG tablet TAKE 1 TABLET BY MOUTH AT BEDTIME. 30 tablet 0   No facility-administered medications prior to visit.    No Known Allergies  Review of Systems  Constitutional: Negative.   Respiratory: Negative.   Cardiovascular: Negative.   Psychiatric/Behavioral: Positive for sleep disturbance. Negative for self-injury and suicidal ideas. The patient is nervous/anxious.        Objective:    Physical Exam  Ht _0  (1.575 m)   Wt 112 lb (50.8 kg)   BMI 20.49 kg/m  Wt Readings from Last 3 Encounters:  03/21/21 112 lb (50.8 kg)  01/24/21 105 lb (47.6 kg)  10/24/20 107 lb 1.9 oz (48.6 kg)    There are no preventive care reminders to display for this patient.  There are no preventive care reminders to display for this patient.   Lab Results  Component Value Date   TSH 2.14 05/09/2020   Lab Results  Component Value Date   WBC 4.7 03/07/2020   HGB 12.1 03/07/2020   HCT 38.5 03/07/2020   MCV 83.0 03/07/2020   PLT 255 03/07/2020    Lab Results  Component Value Date   NA 140 05/09/2020   K 4.5 05/09/2020   CO2 28 05/09/2020   GLUCOSE 87 05/09/2020   BUN 15 05/09/2020   CREATININE 0.70 05/09/2020   BILITOT 0.4 05/09/2020   ALKPHOS 75 05/14/2017  AST 19 05/09/2020   ALT 16 05/09/2020   PROT 7.4 05/09/2020   ALBUMIN 4.4 05/14/2017   CALCIUM 10.0 05/09/2020   ANIONGAP 11 11/30/2014   Lab Results  Component Value Date   CHOL 184 03/07/2020   Lab Results  Component Value Date   HDL 59 03/07/2020   Lab Results  Component Value Date   LDLCALC 105 (H) 03/07/2020   Lab Results  Component Value Date   TRIG 107 03/07/2020   Lab Results  Component Value Date   CHOLHDL 3.1 03/07/2020   Lab Results  Component Value Date   HGBA1C 5.5 12/11/2016       Assessment & Plan:   Problem List Items Addressed This Visit      Other   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 buspar -referral to therapy -she states that she is too nervous to drive      Relevant Medications   mirtazapine (REMERON) 7.5 MG tablet   busPIRone (BUSPAR) 7.5 MG tablet   Other Relevant Orders   Ambulatory referral to Psychology   Poor appetite    -wt improving with megace -refilled megace          Meds ordered this encounter  Medications  . mirtazapine (REMERON) 7.5 MG tablet    Sig: Take 1 tablet (7.5 mg total) by mouth at bedtime.    Dispense:  30 tablet    Refill:  0  . megestrol (MEGACE) 40 MG tablet    Sig: Take 1 tablet (40 mg total) by mouth daily.    Dispense:  30 tablet    Refill:  0  . busPIRone (BUSPAR) 7.5 MG tablet    Sig: Take 1 tablet (7.5 mg total) by mouth 2 (two) times daily.    Dispense:  60 tablet    Refill:  1   Date:  03/21/2021   Location of Patient:  Home Location of Provider: Office Consent was obtain for visit to be over via telehealth. I verified that I am speaking with the correct person using two identifiers.  I connected with  Balinda Quails on 03/21/21 via telephone and verified that I am speaking with the correct person using two identifiers.   I discussed the limitations of evaluation and management by telemedicine. The patient expressed understanding and agreed to proceed.  Time spent: 9 minutes   Noreene Larsson, NP

## 2021-03-21 NOTE — Assessment & Plan Note (Signed)
-  GAD-7 = 15 today -taking mirtazapine -Rx. buspar -referral to therapy -she states that she is too nervous to drive

## 2021-03-21 NOTE — Assessment & Plan Note (Signed)
-  wt improving with megace -refilled megace

## 2021-03-21 NOTE — Assessment & Plan Note (Signed)
-  discussed sleep hygiene and turning off tv 30-60 minutes prior to bed as well as getting in a sleep routine

## 2021-03-22 ENCOUNTER — Telehealth: Payer: Medicare HMO | Admitting: Family Medicine

## 2021-04-20 ENCOUNTER — Ambulatory Visit: Payer: Medicare HMO | Admitting: Nurse Practitioner

## 2021-05-03 ENCOUNTER — Encounter: Payer: Self-pay | Admitting: Nurse Practitioner

## 2021-05-03 ENCOUNTER — Other Ambulatory Visit: Payer: Self-pay

## 2021-05-03 ENCOUNTER — Ambulatory Visit (INDEPENDENT_AMBULATORY_CARE_PROVIDER_SITE_OTHER): Payer: Medicare HMO | Admitting: Nurse Practitioner

## 2021-05-03 VITALS — BP 126/74 | HR 82 | Temp 98.8°F | Resp 18 | Ht 62.5 in | Wt 109.0 lb

## 2021-05-03 DIAGNOSIS — J3089 Other allergic rhinitis: Secondary | ICD-10-CM | POA: Diagnosis not present

## 2021-05-03 DIAGNOSIS — Z0001 Encounter for general adult medical examination with abnormal findings: Secondary | ICD-10-CM

## 2021-05-03 DIAGNOSIS — F5105 Insomnia due to other mental disorder: Secondary | ICD-10-CM

## 2021-05-03 DIAGNOSIS — F419 Anxiety disorder, unspecified: Secondary | ICD-10-CM

## 2021-05-03 DIAGNOSIS — R69 Illness, unspecified: Secondary | ICD-10-CM | POA: Diagnosis not present

## 2021-05-03 DIAGNOSIS — F409 Phobic anxiety disorder, unspecified: Secondary | ICD-10-CM

## 2021-05-03 DIAGNOSIS — Z0000 Encounter for general adult medical examination without abnormal findings: Secondary | ICD-10-CM

## 2021-05-03 MED ORDER — FEXOFENADINE HCL 180 MG PO TABS: 180.0000 mg | ORAL_TABLET | Freq: Every day | ORAL | 1 refills | Status: DC

## 2021-05-03 NOTE — Patient Instructions (Signed)
Please have fasting labs drawn 2-3 days prior to your appointment so we can discuss the results during your office visit.  

## 2021-05-03 NOTE — Assessment & Plan Note (Signed)
-  sleeping well with remeron -no changes to sleep meds

## 2021-05-03 NOTE — Assessment & Plan Note (Signed)
-  has some runny nose -states she has noticed a tremor since starting montelukast, and this is listed as a possible side effect -D/C montelukast -Rx. Allegra -continue flonase

## 2021-05-03 NOTE — Assessment & Plan Note (Signed)
-  GAD-7 = 5 today, last was 15 -she states her daughter's cancer treatments are doing well, and she has less anxiety today that her last OV -her previous psych referral was cancelled by psych; we discussed setting up a new psych referral, but she feels she is doing well and doesn't need an appt at this time

## 2021-05-03 NOTE — Progress Notes (Signed)
Acute Office Visit  Subjective:    Patient ID: Megan Gibbs, female    DOB: Mar 08, 1951, 70 y.o.   MRN: [id number]  Chief Complaint  Patient presents with  . Insomnia  . Anxiety    Follow up    HPI Patient is in today for med check. At her last OV, she was started on buspar, but she is not taking that.  She was referred to psychiatry at her last visit, but this was cancelled by psych.  Her oldest daughter was diagnosed with breast cancer, and that had her stressed.  She states her anxiety has improved.  Last GAD-7 = 15.  She states that her sleeping has been better.  She states that she has noticed a tremor since starting singulair. She would like to try something else for allergies.    Past Medical History:  Diagnosis Date  . Allergic rhinitis   . Anemia   . Anxiety   . Back pain   . Depression   . Hypokalemia   . Osteoporosis   . Prediabetes   . Seasonal allergies   . Vertigo   . Wears glasses     Past Surgical History:  Procedure Laterality Date  . BACK SURGERY  1992 & 2009   Dr. Joya Salm   . BREAST BIOPSY    . BREAST EXCISIONAL BIOPSY    . BREAST LUMPECTOMY WITH RADIOACTIVE SEED LOCALIZATION Right 12/01/2014   Procedure: RIGHT BREAST LUMPECTOMY WITH RADIOACTIVE SEED LOCALIZATION;  Surgeon: Fanny Skates, MD;  Location: [facility];  Service: General;  Laterality: Right;  . COLONOSCOPY N/A 04/19/2014   Procedure: COLONOSCOPY;  Surgeon: Daneil Dolin, MD;  [facility];  Service: Endoscopy;  Laterality: N/A;  9:30 AM  . DILATION AND CURETTAGE OF UTERUS    . TUBAL LIGATION     1976    Family History  Problem Relation Age of Onset  . Diabetes Mother   . Hypertension Mother   . Asthma Sister   . Thyroid disease Sister   . COPD Sister   . Anemia Sister   . Diabetes Sister   . Hypertension Sister   . Diabetes Sister   . Schizophrenia Father   . Drug abuse Brother   . Alcohol abuse Brother   . Sleep apnea Grandchild   . ADD / ADHD  Grandchild   . Seizures Grandchild   . Breast cancer Sister 68  . Thyroid disease Sister   . Hypertension Brother   . Anxiety disorder Maternal Aunt   . Hypertension Daughter   . BRCA 1/2 Daughter   . Anemia Daughter   . Hypertension Daughter   . Asthma Daughter   . Colon cancer Neg Hx     Social History   Socioeconomic History  . Marital status: Divorced    Spouse name: Not on file  . Number of children: 3  . Years of education: Not on file  . Highest education level: Not on file  Occupational History  . Occupation: disabled     Fish farm manager: UNEMPLOYED  Tobacco Use  . Smoking status: Never Smoker  . Smokeless tobacco: Never Used  Substance and Sexual Activity  . Alcohol use: No  . Drug use: No  . Sexual activity: Yes    Birth control/protection: Post-menopausal  Other Topics Concern  . Not on file  Social History Narrative   DOING A LOT OF [place].    Social Determinants of Health   Financial  Resource Strain: Not on file  Food Insecurity: Not on file  Transportation Needs: No Transportation Needs  . Lack of Transportation (Medical): No  . Lack of Transportation (Non-Medical): No  Physical Activity: Sufficiently Active  . Days of Exercise per Week: 5 days  . Minutes of Exercise per Session: 30 min  Stress: Not on file  Social Connections: Moderately Integrated  . Frequency of Communication with Friends and Family: More than three times a week  . Frequency of Social Gatherings with Friends and Family: More than three times a week  . Attends Religious Services: More than 4 times per year  . Active Member of Clubs or Organizations: Yes  . Attends Archivist Meetings: Never  . Marital Status: Widowed  Intimate Partner Violence: Not At Risk  . Fear of Current or Ex-Partner: No  . Emotionally Abused: No  . Physically Abused: No  . Sexually Abused: No    Outpatient Medications Prior to Visit  Medication Sig Dispense Refill  .  alendronate (FOSAMAX) 70 MG tablet TAKE 1 TAB WEEKLY 30 MINS. BEFORE BREAKFAST WITH 8 OZ OF WATER FOR OSTEOPOROSIS. 12 tablet 1  . Calcium Carb-Cholecalciferol 600-200 MG-UNIT TABS Take 1 tablet by mouth daily.     . fluticasone (FLONASE) 50 MCG/ACT nasal spray Place 2 sprays into both nostrils daily. 16 g 2  . megestrol (MEGACE) 40 MG tablet Take 1 tablet (40 mg total) by mouth daily. 30 tablet 0  . mirtazapine (REMERON) 7.5 MG tablet Take 1 tablet (7.5 mg total) by mouth at bedtime. 30 tablet 0  . Multiple Vitamin (MULTIVITAMIN) capsule Take 1 capsule by mouth daily.     [name] omega-3 fish oil (MAXEPA) 1000 MG CAPS capsule Take 2 capsules by mouth daily.     . montelukast (SINGULAIR) 10 MG tablet TAKE 1 TABLET BY MOUTH AT BEDTIME. 30 tablet 0  . busPIRone (BUSPAR) 7.5 MG tablet Take 1 tablet (7.5 mg total) by mouth 2 (two) times daily. (Patient not taking: Reported on 05/03/2021) 60 tablet 1   No facility-administered medications prior to visit.    No Known Allergies  Review of Systems  Constitutional: Negative.   Respiratory: Negative.   Cardiovascular: Negative.   Psychiatric/Behavioral: Negative for self-injury and suicidal ideas.       Reports sleep and anxiety have improved       Objective:    Physical Exam Constitutional:      Appearance: Normal appearance.  Cardiovascular:     Rate and Rhythm: Normal rate and regular rhythm.     Pulses: Normal pulses.     Heart sounds: Normal heart sounds.  Pulmonary:     Effort: Pulmonary effort is normal.     Breath sounds: Normal breath sounds.  Neurological:     Mental Status: She is alert.     Comments: Slight tremor in hands  Psychiatric:        Behavior: Behavior normal.        Thought Content: Thought content normal.        Judgment: Judgment normal.     Comments: Anxious affect; hyperverbal     BP 126/74   Pulse 82   Temp 98.8 F (37.1 C)   Resp 18   Ht 5' 2.5" (1.588 m)   Wt 109 lb (49.4 kg)   SpO2 98%   BMI 19.62  kg/m  Wt Readings from Last 3 Encounters:  05/03/21 109 lb (49.4 kg)  03/21/21 112 lb (50.8 kg)  01/24/21 105 lb (  47.6 kg)    There are no preventive care reminders to display for this patient.  There are no preventive care reminders to display for this patient.   Lab Results  Component Value Date   TSH 2.14 05/09/2020   Lab Results  Component Value Date   WBC 4.7 03/07/2020   HGB 12.1 03/07/2020   HCT 38.5 03/07/2020   MCV 83.0 03/07/2020   PLT 255 03/07/2020   Lab Results  Component Value Date   NA 140 05/09/2020   K 4.5 05/09/2020   CO2 28 05/09/2020   GLUCOSE 87 05/09/2020   BUN 15 05/09/2020   CREATININE 0.70 05/09/2020   BILITOT 0.4 05/09/2020   ALKPHOS 75 05/14/2017   AST 19 05/09/2020   ALT 16 05/09/2020   PROT 7.4 05/09/2020   ALBUMIN 4.4 05/14/2017   CALCIUM 10.0 05/09/2020   ANIONGAP 11 11/30/2014   Lab Results  Component Value Date   CHOL 184 03/07/2020   Lab Results  Component Value Date   HDL 59 03/07/2020   Lab Results  Component Value Date   LDLCALC 105 (H) 03/07/2020   Lab Results  Component Value Date   TRIG 107 03/07/2020   Lab Results  Component Value Date   CHOLHDL 3.1 03/07/2020   Lab Results  Component Value Date   HGBA1C 5.5 12/11/2016       Assessment & Plan:   Problem List Items Addressed This Visit      Respiratory   Allergic rhinitis    -has some runny nose -states she has noticed a tremor since starting montelukast, and this is listed as a possible side effect -D/C montelukast -Rx. 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)    -sleeping well with remeron -no changes to sleep meds      Anxiety - Primary    -GAD-7 = 5 today, last was 15 -she states her daughter's cancer treatments are doing well, and she has less anxiety today that her last OV -her previous psych referral was cancelled by psych; we discussed setting up a new psych referral, but she feels she  is doing well and doesn't need an appt at this time      Relevant Orders   TSH    Other Visit Diagnoses    Routine medical exam       Relevant Orders   CMP14+EGFR   CBC with Differential/Platelet   Lipid Panel With LDL/HDL Ratio   TSH       Meds ordered this encounter  Medications  . fexofenadine (ALLEGRA ALLERGY) 180 MG tablet    Sig: Take 1 tablet (180 mg total) by mouth daily.    Dispense:  90 tablet    Refill:  [place], NP

## 2021-05-11 ENCOUNTER — Other Ambulatory Visit: Payer: Self-pay | Admitting: "Endocrinology

## 2021-05-11 ENCOUNTER — Telehealth: Payer: Self-pay

## 2021-05-11 DIAGNOSIS — E559 Vitamin D deficiency, unspecified: Secondary | ICD-10-CM

## 2021-05-11 DIAGNOSIS — E213 Hyperparathyroidism, unspecified: Secondary | ICD-10-CM

## 2021-05-11 DIAGNOSIS — M810 Age-related osteoporosis without current pathological fracture: Secondary | ICD-10-CM

## 2021-05-11 NOTE — Telephone Encounter (Signed)
Can you update lab orders? Also, does she need a bone density for the visit on 5/24?

## 2021-05-11 NOTE — Telephone Encounter (Signed)
New order put in. Pt had 05/17/20

## 2021-05-17 DIAGNOSIS — E213 Hyperparathyroidism, unspecified: Secondary | ICD-10-CM | POA: Diagnosis not present

## 2021-05-17 DIAGNOSIS — E559 Vitamin D deficiency, unspecified: Secondary | ICD-10-CM | POA: Diagnosis not present

## 2021-05-17 DIAGNOSIS — M810 Age-related osteoporosis without current pathological fracture: Secondary | ICD-10-CM | POA: Diagnosis not present

## 2021-05-18 LAB — PTH, INTACT AND CALCIUM
Calcium: 9.4 mg/dL (ref 8.7–10.3)
PTH: 26 pg/mL (ref 15–65)

## 2021-05-18 LAB — COMPREHENSIVE METABOLIC PANEL
ALT: 22 IU/L (ref 0–32)
AST: 18 IU/L (ref 0–40)
Albumin/Globulin Ratio: 1.6 (ref 1.2–2.2)
Albumin: 4.6 g/dL (ref 3.8–4.8)
Alkaline Phosphatase: 73 IU/L (ref 44–121)
BUN/Creatinine Ratio: 17 (ref 12–28)
BUN: 13 mg/dL (ref 8–27)
Bilirubin Total: 0.4 mg/dL (ref 0.0–1.2)
CO2: 19 mmol/L — ABNORMAL LOW (ref 20–29)
Calcium: 9.7 mg/dL (ref 8.7–10.3)
Chloride: 104 mmol/L (ref 96–106)
Creatinine, Ser: 0.76 mg/dL (ref 0.57–1.00)
Globulin, Total: 2.9 g/dL (ref 1.5–4.5)
Glucose: 85 mg/dL (ref 65–99)
Potassium: 4.7 mmol/L (ref 3.5–5.2)
Sodium: 141 mmol/L (ref 134–144)
Total Protein: 7.5 g/dL (ref 6.0–8.5)
eGFR: 85 mL/min/{1.73_m2} (ref >59)

## 2021-05-18 LAB — VITAMIN D 25 HYDROXY (VIT D DEFICIENCY, FRACTURES): Vit D, 25-Hydroxy: 64.3 ng/mL (ref 30.0–100.0)

## 2021-05-23 ENCOUNTER — Encounter: Payer: Self-pay | Admitting: "Endocrinology

## 2021-05-23 ENCOUNTER — Other Ambulatory Visit: Payer: Self-pay

## 2021-05-23 ENCOUNTER — Ambulatory Visit (INDEPENDENT_AMBULATORY_CARE_PROVIDER_SITE_OTHER): Payer: Medicare HMO | Admitting: "Endocrinology

## 2021-05-23 VITALS — BP 130/76 | HR 76 | Ht 62.0 in | Wt 109.2 lb

## 2021-05-23 DIAGNOSIS — E559 Vitamin D deficiency, unspecified: Secondary | ICD-10-CM

## 2021-05-23 DIAGNOSIS — M810 Age-related osteoporosis without current pathological fracture: Secondary | ICD-10-CM

## 2021-05-23 NOTE — Progress Notes (Signed)
05/23/2021     Endocrinology follow-up note   Subjective:    Patient ID: Megan Gibbs, female    DOB: [birth year],    Past Medical History:  Diagnosis Date  . Allergic rhinitis   . Anemia   . Anxiety   . Back pain   . Depression   . Hypokalemia   . Osteoporosis   . Prediabetes   . Seasonal allergies   . Vertigo   . Wears glasses    Past Surgical History:  Procedure Laterality Date  . BACK SURGERY  1992 & 2009   Dr. Joya Salm   . BREAST BIOPSY    . BREAST EXCISIONAL BIOPSY    . BREAST LUMPECTOMY WITH RADIOACTIVE SEED LOCALIZATION Right 12/01/2014   Procedure: RIGHT BREAST LUMPECTOMY WITH RADIOACTIVE SEED LOCALIZATION;  Surgeon: Fanny Skates, MD;  Location: [facility];  Service: General;  Laterality: Right;  . COLONOSCOPY N/A 04/19/2014   Procedure: COLONOSCOPY;  Surgeon: Daneil Dolin, MD;  Location: [facility];  Service: Endoscopy;  Laterality: N/A;  9:30 AM  . DILATION AND CURETTAGE OF UTERUS    . TUBAL LIGATION     1976   Social History   Socioeconomic History  . Marital status: Divorced    Spouse name: Not on file  . Number of children: 3  . Years of education: Not on file  . Highest education level: Not on file  Occupational History  . Occupation: disabled     Fish farm manager: UNEMPLOYED  Tobacco Use  . Smoking status: Never Smoker  . Smokeless tobacco: Never Used  Substance and Sexual Activity  . Alcohol use: No  . Drug use: No  . Sexual activity: Yes    Birth control/protection: Post-menopausal  Other Topics Concern  . Not on file  Social History Narrative   DOING A LOT OF [place].    Social Determinants of Health   Financial Resource Strain: Not on file  Food Insecurity: Not on file  Transportation Needs: No Transportation Needs  . Lack of Transportation (Medical): No  . Lack of Transportation (Non-Medical): No  Physical Activity: Sufficiently Active  . Days of Exercise per Week: 5 days  . Minutes of Exercise per  Session: 30 min  Stress: Not on file  Social Connections: Moderately Integrated  . Frequency of Communication with Friends and Family: More than three times a week  . Frequency of Social Gatherings with Friends and Family: More than three times a week  . Attends Religious Services: More than 4 times per year  . Active Member of Clubs or Organizations: Yes  . Attends Archivist Meetings: Never  . Marital Status: Widowed   Outpatient Encounter Medications as of 05/23/2021  Medication Sig  . alendronate (FOSAMAX) 70 MG tablet TAKE 1 TAB WEEKLY 30 MINS. BEFORE BREAKFAST WITH 8 OZ OF WATER FOR OSTEOPOROSIS.  [name] Calcium Carb-Cholecalciferol 600-200 MG-UNIT TABS Take 1 tablet by mouth daily.   . fexofenadine (ALLEGRA ALLERGY) 180 MG tablet Take 1 tablet (180 mg total) by mouth daily.  . fluticasone (FLONASE) 50 MCG/ACT nasal spray Place 2 sprays into both nostrils daily.  . megestrol (MEGACE) 40 MG tablet Take 1 tablet (40 mg total) by mouth daily. (Patient not taking: Reported on 05/23/2021)  . mirtazapine (REMERON) 7.5 MG tablet Take 1 tablet (7.5 mg total) by mouth at bedtime.  . Multiple Vitamin (MULTIVITAMIN) capsule Take 1 capsule by mouth daily.   [name] omega-3 fish oil (MAXEPA) 1000 MG CAPS  capsule Take 2 capsules by mouth daily.    No facility-administered encounter medications on file as of 05/23/2021.   ALLERGIES: No Known Allergies VACCINATION STATUS: Immunization History  Administered Date(s) Administered  . Fluad Quad(high Dose 65+) 09/14/2019, 10/24/2020  . Influenza Split 09/24/2012  . Influenza Whole 10/04/2009, 09/29/2010, 09/17/2011  . Influenza,inj,Quad PF,6+ Mos 10/29/2013, 10/11/2014, 09/20/2015, 08/20/2016, 09/17/2017, 09/18/2018  . Moderna Sars-Covid-2 Vaccination 03/09/2020, 04/10/2020, 11/10/2020  . Pneumococcal Conjugate-13 03/17/2015  . Pneumococcal Polysaccharide-23 12/18/2016  . Td 03/24/2010  . Zoster 09/20/2011    HPI  71 yr old female with medical  hx as above.  She is returning to follow-up for her osteoporosis.  She was initiated on Fosamax treatment in 2015.  She continues to tolerate this medication.  She reports no side effects, no new complaints today.   She continues to tolerate this medication.  . She underwent previsit DEXA scan which generally shows significant improvement from 2011-2021, in all areas scanned spine, hips, and femuri . She has had lumbar disc prolapse for which she underwent surgery in 2002 and 2009. She lost 3 pounds since last visit.  She went to menopause in her 35s.   has 3 grown children. she denies parathyroid, thyroid dysfunction.  She denies history of fragility fracture.  She remains on vitamin D and low-dose calcium supplement.  Review of Systems Limited as above.  Objective:    BP 130/76   Pulse 76   Ht 5' 2"  (1.575 m)   Wt 109 lb 3.2 oz (49.5 kg)   BMI 19.97 kg/m   Wt Readings from Last 3 Encounters:  05/23/21 109 lb 3.2 oz (49.5 kg)  05/03/21 109 lb (49.4 kg)  03/21/21 112 lb (50.8 kg)    Physical Exam     Results for orders placed or performed in visit on 05/11/21  PTH, Intact and Calcium  Result Value Ref Range   Calcium 9.4 8.7 - 10.3 mg/dL   PTH 26 15 - 65 pg/mL   PTH Interp Comment   Comprehensive metabolic panel  Result Value Ref Range   Glucose 85 65 - 99 mg/dL   BUN 13 8 - 27 mg/dL   Creatinine, Ser 0.76 0.57 - 1.00 mg/dL   eGFR 85 >59 mL/min/1.73   BUN/Creatinine Ratio 17 12 - 28   Sodium 141 134 - 144 mmol/L   Potassium 4.7 3.5 - 5.2 mmol/L   Chloride 104 96 - 106 mmol/L   CO2 19 (L) 20 - 29 mmol/L   Calcium 9.7 8.7 - 10.3 mg/dL   Total Protein 7.5 6.0 - 8.5 g/dL   Albumin 4.6 3.8 - 4.8 g/dL   Globulin, Total 2.9 1.5 - 4.5 g/dL   Albumin/Globulin Ratio 1.6 1.2 - 2.2   Bilirubin Total 0.4 0.0 - 1.2 mg/dL   Alkaline Phosphatase 73 44 - 121 IU/L   AST 18 0 - 40 IU/L   ALT 22 0 - 32 IU/L  VITAMIN D 25 Hydroxy (Vit-D Deficiency, Fractures)  Result Value Ref Range    Vit D, 25-Hydroxy 64.3 30.0 - 100.0 ng/mL   Complete Blood Count (Most recent): Lab Results  Component Value Date   WBC 4.7 03/07/2020   HGB 12.1 03/07/2020   HCT 38.5 03/07/2020   MCV 83.0 03/07/2020   PLT 255 03/07/2020   Chemistry (most recent): Lab Results  Component Value Date   NA 141 05/17/2021   K 4.7 05/17/2021   CL 104 05/17/2021   CO2 19 (L) 05/17/2021  BUN 13 05/17/2021   CREATININE 0.76 05/17/2021   Diabetic Labs (most recent): Lab Results  Component Value Date   HGBA1C 5.5 12/11/2016   HGBA1C 5.6 12/12/2015   HGBA1C 5.6 06/21/2015   Lipid profile (most recent): Lab Results  Component Value Date   TRIG 107 03/07/2020   CHOL 184 03/07/2020     Assessment & Plan:   1. 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  Previsit DEXA scan from May 17, 2020 showed continued progressive improvement compared to her last DEXA scan readings from 2011-2021 in spine, hips, and femuri. She is tolerating Fosamax, advised to continue 70 mg weekly.  She is advised on side effects and precautions. She was started on Fosamax in 2015. -She has no interval fragility fracture.  This is  a good development for her since she has not lost significant bone density since she was started on Fosamax in 2015.  Her PTH is normal along with calcium and TFTs, ruling out secondary cause of osteoporosis. She is made aware of the possibility of treating her with Fosamax for 5 to 8 years before considering drug holiday.   Risk of untreated osteoporosis is discussed with her in detail.  Her next bone density is due in May 2023.  2. Vitamin D deficiency -She is now vitamin D replete at 64.3.  She is status post vitamin D2 50,000 units weekly.  She will continue to maintain with vitamin D3 600/200 mg p.o. daily.  I advised patient to maintain close follow up with their PCP for primary care needs.  I spent 25 minutes in the care of the patient today  including review of labs from Thyroid Function, CMP, and other relevant labs ; imaging/biopsy records (current and previous including abstractions from other facilities); face-to-face time discussing  her lab results and symptoms, medications doses, her options of short and long term treatment based on the latest standards of care / guidelines;   and documenting the encounter.  Megan Gibbs  participated in the discussions, expressed understanding, and voiced agreement with the above plans.  All questions were answered to her satisfaction. she is encouraged to contact clinic should she have any questions or concerns prior to her return visit.   Follow up plan: Return in about 1 year (around 05/23/2022) for F/U with Pre-visit Labs, DXA Scan B4 NV.  Glade Lloyd, MD Phone: [phone]  Fax: [phone]  -  This note was partially dictated with voice recognition software. Similar sounding words can be transcribed inadequately or may not  be corrected upon review.  05/23/2021, 11:24 AM

## 2021-07-17 DIAGNOSIS — R350 Frequency of micturition: Secondary | ICD-10-CM | POA: Diagnosis not present

## 2021-07-17 DIAGNOSIS — N390 Urinary tract infection, site not specified: Secondary | ICD-10-CM | POA: Diagnosis not present

## 2021-07-20 ENCOUNTER — Other Ambulatory Visit: Payer: Self-pay

## 2021-07-20 ENCOUNTER — Ambulatory Visit (INDEPENDENT_AMBULATORY_CARE_PROVIDER_SITE_OTHER): Payer: Medicare HMO

## 2021-07-20 VITALS — Ht 62.0 in | Wt 110.0 lb

## 2021-07-20 DIAGNOSIS — Z0000 Encounter for general adult medical examination without abnormal findings: Secondary | ICD-10-CM | POA: Diagnosis not present

## 2021-07-20 NOTE — Patient Instructions (Signed)
Megan Gibbs , Thank you for taking time to come for your Medicare Wellness Visit. I appreciate your ongoing commitment to your health goals. Please review the following plan we discussed and let me know if I can assist you in the future.   Screening recommendations/referrals: Colonoscopy: 04/19/24 Mammogram: 12/26/21 Bone Density: Complete Recommended yearly ophthalmology/optometry visit for glaucoma screening and checkup Recommended yearly dental visit for hygiene and checkup  Vaccinations: Influenza vaccine: Fall 2022 Pneumococcal vaccine: Complete Tdap vaccine: 10/26/21 Shingles vaccine: Declined    Advanced directives: No  Conditions/risks identified: Currently has a UTI, is on an antibiotic prescribed from an urgent care. Will let us know if this doesn't improve.   Next appointment: [facility] in 1 year. Office visit 08/03/21 @ 10am with Donneta Romberg, NP.   Preventive Care 70 Years and Older, Female Preventive care refers to lifestyle choices and visits with your health care provider that can promote health and wellness. What does preventive care include? A yearly physical exam. This is also called an annual well check. Dental exams once or twice a year. Routine eye exams. Ask your health care provider how often you should have your eyes checked. Personal lifestyle choices, including: Daily care of your teeth and gums. Regular physical activity. Eating a healthy diet. Avoiding tobacco and drug use. Limiting alcohol use. Practicing safe sex. Taking low-dose aspirin every day. Taking vitamin and mineral supplements as recommended by your health care provider. What happens during an annual well check? The services and screenings done by your health care provider during your annual well check will depend on your age, overall health, lifestyle risk factors, and family history of disease. Counseling  Your health care provider may ask you questions about your: Alcohol use. Tobacco  use. Drug use. Emotional well-being. Home and relationship well-being. Sexual activity. Eating habits. History of falls. Memory and ability to understand (cognition). Work and work Statistician. Reproductive health. Screening  You may have the following tests or measurements: Height, weight, and BMI. Blood pressure. Lipid and cholesterol levels. These may be checked every 5 years, or more frequently if you are over 23 years old. Skin check. Lung cancer screening. You may have this screening every year starting at age 42 if you have a 30-pack-year history of smoking and currently smoke or have quit within the past 15 years. Fecal occult blood test (FOBT) of the stool. You may have this test every year starting at age 52. Flexible sigmoidoscopy or colonoscopy. You may have a sigmoidoscopy every 5 years or a colonoscopy every 10 years starting at age 56. Hepatitis C blood test. Hepatitis B blood test. Sexually transmitted disease (STD) testing. Diabetes screening. This is done by checking your blood sugar (glucose) after you have not eaten for a while (fasting). You may have this done every 1-3 years. Bone density scan. This is done to screen for osteoporosis. You may have this done starting at age 48. Mammogram. This may be done every 1-2 years. Talk to your health care provider about how often you should have regular mammograms. Talk with your health care provider about your test results, treatment options, and if necessary, the need for more tests. Vaccines  Your health care provider may recommend certain vaccines, such as: Influenza vaccine. This is recommended every year. Tetanus, diphtheria, and acellular pertussis (Tdap, Td) vaccine. You may need a Td booster every 10 years. Zoster vaccine. You may need this after age 78. Pneumococcal 13-valent conjugate (PCV13) vaccine. One dose is recommended after age 87.  Pneumococcal polysaccharide (PPSV23) vaccine. One dose is recommended  after age 63. Talk to your health care provider about which screenings and vaccines you need and how often you need them. This information is not intended to replace advice given to you by your health care provider. Make sure you discuss any questions you have with your health care provider. Document Released: 01/13/2016 Document Revised: 09/05/2016 Document Reviewed: 10/18/2015 Elsevier Interactive Patient Education  2017 [place] Prevention in the Home Falls can cause injuries. They can happen to people of all ages. There are many things you can do to make your home safe and to help prevent falls. What can I do on the outside of my home? Regularly fix the edges of walkways and driveways and fix any cracks. Remove anything that might make you trip as you walk through a door, such as a raised step or threshold. Trim any bushes or trees on the path to your home. Use bright outdoor lighting. Clear any walking paths of anything that might make someone trip, such as rocks or tools. Regularly check to see if handrails are loose or broken. Make sure that both sides of any steps have handrails. Any raised decks and porches should have guardrails on the edges. Have any leaves, snow, or ice cleared regularly. Use sand or salt on walking paths during winter. Clean up any spills in your garage right away. This includes oil or grease spills. What can I do in the bathroom? Use night lights. Install grab bars by the toilet and in the tub and shower. Do not use towel bars as grab bars. Use non-skid mats or decals in the tub or shower. If you need to sit down in the shower, use a plastic, non-slip stool. Keep the floor dry. Clean up any water that spills on the floor as soon as it happens. Remove soap buildup in the tub or shower regularly. Attach bath mats securely with double-sided non-slip rug tape. Do not have throw rugs and other things on the floor that can make you trip. What can I do  in the bedroom? Use night lights. Make sure that you have a light by your bed that is easy to reach. Do not use any sheets or blankets that are too big for your bed. They should not hang down onto the floor. Have a firm chair that has side arms. You can use this for support while you get dressed. Do not have throw rugs and other things on the floor that can make you trip. What can I do in the kitchen? Clean up any spills right away. Avoid walking on wet floors. Keep items that you use a lot in easy-to-reach places. If you need to reach something above you, use a strong step stool that has a grab bar. Keep electrical cords out of the way. Do not use floor polish or wax that makes floors slippery. If you must use wax, use non-skid floor wax. Do not have throw rugs and other things on the floor that can make you trip. What can I do with my stairs? Do not leave any items on the stairs. Make sure that there are handrails on both sides of the stairs and use them. Fix handrails that are broken or loose. Make sure that handrails are as long as the stairways. Check any carpeting to make sure that it is firmly attached to the stairs. Fix any carpet that is loose or worn. Avoid having throw rugs at the  top or bottom of the stairs. If you do have throw rugs, attach them to the floor with carpet tape. Make sure that you have a light switch at the top of the stairs and the bottom of the stairs. If you do not have them, ask someone to add them for you. What else can I do to help prevent falls? Wear shoes that: Do not have high heels. Have rubber bottoms. Are comfortable and fit you well. Are closed at the toe. Do not wear sandals. If you use a stepladder: Make sure that it is fully opened. Do not climb a closed stepladder. Make sure that both sides of the stepladder are locked into place. Ask someone to hold it for you, if possible. Clearly mark and make sure that you can see: Any grab bars or  handrails. First and last steps. Where the edge of each step is. Use tools that help you move around (mobility aids) if they are needed. These include: Canes. Walkers. Scooters. Crutches. Turn on the lights when you go into a dark area. Replace any light bulbs as soon as they burn out. Set up your furniture so you have a clear path. Avoid moving your furniture around. If any of your floors are uneven, fix them. If there are any pets around you, be aware of where they are. Review your medicines with your doctor. Some medicines can make you feel dizzy. This can increase your chance of falling. Ask your doctor what other things that you can do to help prevent falls. This information is not intended to replace advice given to you by your health care provider. Make sure you discuss any questions you have with your health care provider. Document Released: 10/13/2009 Document Revised: 05/24/2016 Document Reviewed: 01/21/2015 Elsevier Interactive Patient Education  2017 [employer].

## 2021-07-20 NOTE — Progress Notes (Signed)
Subjective:   Megan Gibbs is a 70 y.o. female who presents for Medicare Annual (Subsequent) preventive examination.  Review of Systems       Objective:    There were no vitals filed for this visit. There is no height or weight on file to calculate BMI.  Advanced Directives 07/18/2020 06/18/2018 05/09/2017 12/30/2016 05/13/2015 12/01/2014 11/24/2014  Does Patient Have a Medical Advance Directive? No No No No No No No  Does patient want to make changes to medical advance directive? Yes (ED - Information included in AVS) - - - - - -  Would patient like information on creating a medical advance directive? - Yes (MAU/Ambulatory/Procedural Areas - Information given) Yes (MAU/Ambulatory/Procedural Areas - Information given) No - Patient declined No - patient declined information No - patient declined information No - patient declined information  Pre-existing out of facility DNR order (yellow form or pink MOST form) - - - - - - -    Current Medications (verified) Outpatient Encounter Medications as of 07/20/2021  Medication Sig   alendronate (FOSAMAX) 70 MG tablet TAKE 1 TAB WEEKLY 30 MINS. BEFORE BREAKFAST WITH 8 OZ OF WATER FOR OSTEOPOROSIS.   Calcium Carb-Cholecalciferol 600-200 MG-UNIT TABS Take 1 tablet by mouth daily.    fexofenadine (ALLEGRA ALLERGY) 180 MG tablet Take 1 tablet (180 mg total) by mouth daily.   fluticasone (FLONASE) 50 MCG/ACT nasal spray Place 2 sprays into both nostrils daily.   megestrol (MEGACE) 40 MG tablet Take 1 tablet (40 mg total) by mouth daily. (Patient not taking: Reported on 05/23/2021)   mirtazapine (REMERON) 7.5 MG tablet Take 1 tablet (7.5 mg total) by mouth at bedtime.   Multiple Vitamin (MULTIVITAMIN) capsule Take 1 capsule by mouth daily.    omega-3 fish oil (MAXEPA) 1000 MG CAPS capsule Take 2 capsules by mouth daily.    No facility-administered encounter medications on file as of 07/20/2021.    Allergies (verified) Patient has no known allergies.    History: Past Medical History:  Diagnosis Date   Allergic rhinitis    Anemia    Anxiety    Back pain    Depression    Hypokalemia    Osteoporosis    Prediabetes    Seasonal allergies    Vertigo    Wears glasses    Past Surgical History:  Procedure Laterality Date   BACK SURGERY  1992 & 2009   Dr. Joya Salm    BREAST BIOPSY     BREAST EXCISIONAL BIOPSY     BREAST LUMPECTOMY WITH RADIOACTIVE SEED LOCALIZATION Right 12/01/2014   Procedure: RIGHT BREAST LUMPECTOMY WITH RADIOACTIVE SEED LOCALIZATION;  Surgeon: Fanny Skates, MD;  Location: [facility];  Service: General;  Laterality: Right;   COLONOSCOPY N/A 04/19/2014   Procedure: COLONOSCOPY;  Surgeon: Daneil Dolin, MD;  [facility];  Service: Endoscopy;  Laterality: N/A;  9:30 AM   DILATION AND CURETTAGE OF UTERUS     TUBAL LIGATION     1976   Family History  Problem Relation Age of Onset   Diabetes Mother    Hypertension Mother    Asthma Sister    Thyroid disease Sister    COPD Sister    Anemia Sister    Diabetes Sister    Hypertension Sister    Diabetes Sister    Schizophrenia Father    Drug abuse Brother    Alcohol abuse Brother    Sleep apnea Grandchild    ADD / ADHD Grandchild  Seizures Grandchild    Breast cancer Sister 76   Thyroid disease Sister    Hypertension Brother    Anxiety disorder Maternal Aunt    Hypertension Daughter    BRCA 1/2 Daughter    Anemia Daughter    Hypertension Daughter    Asthma Daughter    Colon cancer Neg Hx    Social History   Socioeconomic History   Marital status: Divorced    Spouse name: Not on file   Number of children: 3   Years of education: Not on file   Highest education level: Not on file  Occupational History   Occupation: disabled     Employer: UNEMPLOYED  Tobacco Use   Smoking status: Never   Smokeless tobacco: Never  Substance and Sexual Activity   Alcohol use: No   Drug use: No   Sexual activity: Yes    Birth  control/protection: Post-menopausal  Other Topics Concern   Not on file  Social History Narrative   DOING A LOT [place].    Social Determinants of Health   Financial Resource Strain: Not on file  Food Insecurity: Not on file  Transportation Needs: Not on file  Physical Activity: Not on file  Stress: Not on file  Social Connections: Not on file    Tobacco Counseling Counseling given: Not Answered   Clinical Intake:                 Diabetic? no         Activities of Daily Living No flowsheet data found.  Patient Care Team: Fayrene Helper, MD as PCP - General Rourk, Cristopher Estimable, MD as Consulting Physician (Gastroenterology) Lay, Bobbye Charleston, MD (Psychiatry) Cassandria Anger, MD as Consulting Physician (Endocrinology) Madelin Headings, DO as Consulting Physician (Optometry)  Indicate any recent Medical Services you may have received from other than [facility] providers in the past year (date may be approximate).     Assessment:   This is a routine wellness examination for [facility].  Hearing/Vision screen No results found.  Dietary issues and exercise activities discussed:     Goals Addressed   None   Depression Screen PHQ 2/9 Scores 05/03/2021 03/21/2021 01/24/2021 01/24/2021 10/24/2020 07/18/2020 06/21/2020  PHQ - 2 Score 0 0 1 0 1 0 0  PHQ- 9 Score - - - - - - -    Fall Risk Fall Risk  05/03/2021 03/21/2021 01/24/2021 10/24/2020 07/18/2020  Falls in the past year? 0 0 0 0 0  Number falls in past yr: 0 0 0 0 0  Injury with Fall? 0 0 0 0 0  Risk for fall due to : No Fall Risks - - - -  Follow up Falls evaluation completed - - - -    FALL RISK PREVENTION PERTAINING TO THE HOME:  Any stairs in or around the home? No  If so, are there any without handrails?  N/a Home free of loose throw rugs in walkways, pet beds, electrical cords, etc? Yes  Adequate lighting in your home to reduce risk of falls? Yes   ASSISTIVE DEVICES UTILIZED TO  PREVENT FALLS:  Life alert? No  Use of a cane, walker or w/c? No  Grab bars in the bathroom? No  Shower chair or bench in shower? No  Elevated toilet seat or a handicapped toilet? No   TIMED UP AND GO:  Was the test performed? No .  Length of time to ambulate n/a    Cognitive  Function:     6CIT Screen 07/18/2020 06/24/2019 06/18/2018 05/09/2017  What Year? 0 points 0 points 0 points 0 points  What month? 0 points 0 points 0 points 0 points  What time? 0 points 0 points 0 points 0 points  Count back from 20 0 points 0 points 0 points 0 points  Months in reverse 0 points 0 points 0 points 0 points  Repeat phrase 0 points 2 points 2 points 0 points  Total Score 0 2 2 0    Immunizations Immunization History  Administered Date(s) Administered   Fluad Quad(high Dose 65+) 09/14/2019, 10/24/2020   Influenza Split 09/24/2012   Influenza Whole 10/04/2009, 09/29/2010, 09/17/2011   Influenza,inj,Quad PF,6+ Mos 10/29/2013, 10/11/2014, 09/20/2015, 08/20/2016, 09/17/2017, 09/18/2018   Moderna Sars-Covid-2 Vaccination 03/09/2020, 04/10/2020, 11/10/2020   Pneumococcal Conjugate-13 03/17/2015   Pneumococcal Polysaccharide-23 12/18/2016   Td 03/24/2010   Zoster, Live 09/20/2011    TDAP status: Up to date  Flu: Up to date  Pneumococcal vaccine status: Up to date  Covid-19 vaccine status: Completed vaccines  Qualifies for Shingles Vaccine? Yes   Zostavax completed No   Shingrix Completed?: No.    Education has been provided regarding the importance of this vaccine. Patient has been advised to call insurance company to determine out of pocket expense if they have not yet received this vaccine. Advised may also receive vaccine at local pharmacy or Health Dept. Verbalized acceptance and understanding.  Screening Tests Health Maintenance  Topic Date Due   Zoster Vaccines- Shingrix (1 of 2) Never done   COVID-19 Vaccine (4 - Booster for Moderna series) 02/10/2021   TETANUS/TDAP   10/26/2021 (Originally 03/24/2020)   INFLUENZA VACCINE  07/31/2021   MAMMOGRAM  12/26/2022   COLONOSCOPY (Pts 45-55yr Insurance coverage will need to be confirmed)  04/19/2024   DEXA SCAN  Completed   Hepatitis C Screening  Completed   PNA vac Low Risk Adult  Completed   HPV VACCINES  Aged Out    Health Maintenance  Health Maintenance Due  Topic Date Due   Zoster Vaccines- Shingrix (1 of 2) Never done   COVID-19 Vaccine (4 - Booster for Moderna series) 02/10/2021    Colorectal cancer screening: Type of screening: Colonoscopy. Completed 04/19/24. Repeat every 10 years  Mammogram status: Completed 12/26/20. Repeat every year   Bone Density status: Completed 05/17/20. Results reflect: Bone density results: NORMAL. Repeat every 5 years. Lung Cancer Screening: (Low Dose CT Chest recommended if Age 70-80years, 30 pack-year currently smoking OR have quit w/in 15years.) does not qualify.   Lung Cancer Screening Referral: n/a  Additional Screening:  Hepatitis C Screening: does not qualify; Completed  Vision Screening: Recommended annual ophthalmology exams for early detection of glaucoma and other disorders of the eye. Is the patient up to date with their annual eye exam?  Yes  Who is the provider or what is the name of the office in which the patient attends annual eye exams? [facility]. ELedell NossIf pt is not established with a provider, would they like to be referred to a provider to establish care?  N/a .   Dental Screening: Recommended annual dental exams for proper oral hygiene  Community Resource Referral / Chronic Care Management: CRR required this visit?  No   CCM required this visit?  No      Plan:     I have personally reviewed and noted the following in the patient's chart:   Medical and social history Use of alcohol,  tobacco or illicit drugs  Current medications and supplements including opioid prescriptions.  Functional ability and status Nutritional  status Physical activity Advanced directives List of other physicians Hospitalizations, surgeries, and ER visits in previous 12 months Vitals Screenings to include cognitive, depression, and falls Referrals and appointments  In addition, I have reviewed and discussed with patient certain preventive protocols, quality metrics, and best practice recommendations. A written personalized care plan for preventive services as well as general preventive health recommendations were provided to patient.     Laretta Bolster, [place]   08/09/1750   Nurse Notes: AWV conducted by nurse in office by phone. Patient gave consent to telehealth visit via audio. Provider in the office at the time of this visit. Patient at home at the time of this visit. Visit took 35 minutes to complete.

## 2021-07-25 DIAGNOSIS — Z13220 Encounter for screening for lipoid disorders: Secondary | ICD-10-CM | POA: Diagnosis not present

## 2021-07-25 DIAGNOSIS — K630 Abscess of intestine: Secondary | ICD-10-CM | POA: Diagnosis not present

## 2021-07-25 DIAGNOSIS — E785 Hyperlipidemia, unspecified: Secondary | ICD-10-CM | POA: Diagnosis not present

## 2021-07-25 DIAGNOSIS — R69 Illness, unspecified: Secondary | ICD-10-CM | POA: Diagnosis not present

## 2021-07-25 DIAGNOSIS — E559 Vitamin D deficiency, unspecified: Secondary | ICD-10-CM | POA: Diagnosis not present

## 2021-07-25 DIAGNOSIS — Z1329 Encounter for screening for other suspected endocrine disorder: Secondary | ICD-10-CM | POA: Diagnosis not present

## 2021-07-25 DIAGNOSIS — Z0000 Encounter for general adult medical examination without abnormal findings: Secondary | ICD-10-CM | POA: Diagnosis not present

## 2021-07-25 DIAGNOSIS — Z833 Family history of diabetes mellitus: Secondary | ICD-10-CM | POA: Diagnosis not present

## 2021-07-26 ENCOUNTER — Other Ambulatory Visit: Payer: Self-pay | Admitting: Nurse Practitioner

## 2021-07-26 DIAGNOSIS — R946 Abnormal results of thyroid function studies: Secondary | ICD-10-CM

## 2021-07-26 LAB — CMP14+EGFR
ALT: 22 IU/L (ref 0–32)
AST: 27 IU/L (ref 0–40)
Albumin/Globulin Ratio: 1.7 (ref 1.2–2.2)
Albumin: 4.7 g/dL (ref 3.8–4.8)
Alkaline Phosphatase: 82 IU/L (ref 44–121)
BUN/Creatinine Ratio: 15 (ref 12–28)
BUN: 10 mg/dL (ref 8–27)
Bilirubin Total: 0.4 mg/dL (ref 0.0–1.2)
CO2: 20 mmol/L (ref 20–29)
Calcium: 9.8 mg/dL (ref 8.7–10.3)
Chloride: 103 mmol/L (ref 96–106)
Creatinine, Ser: 0.68 mg/dL (ref 0.57–1.00)
Globulin, Total: 2.7 g/dL (ref 1.5–4.5)
Glucose: 81 mg/dL (ref 65–99)
Potassium: 5.3 mmol/L — ABNORMAL HIGH (ref 3.5–5.2)
Sodium: 142 mmol/L (ref 134–144)
Total Protein: 7.4 g/dL (ref 6.0–8.5)
eGFR: 94 mL/min/{1.73_m2} (ref >59)

## 2021-07-26 LAB — CBC WITH DIFFERENTIAL/PLATELET
Basophils Absolute: 0.1 10*3/uL (ref 0.0–0.2)
Basos: 1 %
EOS (ABSOLUTE): 0 10*3/uL (ref 0.0–0.4)
Eos: 1 %
Hematocrit: 38.9 % (ref 34.0–46.6)
Hemoglobin: 12.6 g/dL (ref 11.1–15.9)
Immature Grans (Abs): 0 10*3/uL (ref 0.0–0.1)
Immature Granulocytes: 0 %
Lymphocytes Absolute: 2.5 10*3/uL (ref 0.7–3.1)
Lymphs: 55 %
MCH: 25.7 pg — ABNORMAL LOW (ref 26.6–33.0)
MCHC: 32.4 g/dL (ref 31.5–35.7)
MCV: 79 fL (ref 79–97)
Monocytes Absolute: 0.4 10*3/uL (ref 0.1–0.9)
Monocytes: 8 %
Neutrophils Absolute: 1.6 10*3/uL (ref 1.4–7.0)
Neutrophils: 35 %
Platelets: 213 10*3/uL (ref 150–450)
RBC: 4.91 x10E6/uL (ref 3.77–5.28)
RDW: 12.8 % (ref 11.7–15.4)
WBC: 4.5 10*3/uL (ref 3.4–10.8)

## 2021-07-26 LAB — TSH: TSH: <0.005 u[IU]/mL — ABNORMAL LOW (ref 0.450–4.500)

## 2021-07-26 LAB — LIPID PANEL WITH LDL/HDL RATIO
Cholesterol, Total: 170 mg/dL (ref 100–199)
HDL: 52 mg/dL (ref >39)
LDL Chol Calc (NIH): 101 mg/dL — ABNORMAL HIGH (ref 0–99)
LDL/HDL Ratio: 1.9 ratio (ref 0.0–3.2)
Triglycerides: 92 mg/dL (ref 0–149)
VLDL Cholesterol Cal: 17 mg/dL (ref 5–40)

## 2021-07-26 NOTE — Progress Notes (Signed)
If she is taking any vitamins with biotin in them, please stop taking that prior to visiting the endocrinologist.

## 2021-07-26 NOTE — Progress Notes (Signed)
Lab Results  Component Value Date   TSH <0.005 (L) 07/25/2021   -referral to endocrinology

## 2021-07-26 NOTE — Progress Notes (Signed)
Her TSH is low, which means that her thyroid is running fast (hyperthyroidism). I added a T4 and T3 to your labs, and I will refer you to endocrinology. They will likely draw more labs.

## 2021-08-03 ENCOUNTER — Other Ambulatory Visit: Payer: Self-pay

## 2021-08-03 ENCOUNTER — Other Ambulatory Visit (HOSPITAL_COMMUNITY): Payer: Self-pay | Admitting: Family Medicine

## 2021-08-03 ENCOUNTER — Ambulatory Visit (INDEPENDENT_AMBULATORY_CARE_PROVIDER_SITE_OTHER): Payer: Medicare HMO | Admitting: Family Medicine

## 2021-08-03 VITALS — BP 133/78 | HR 77 | Ht 62.0 in | Wt 107.8 lb

## 2021-08-03 DIAGNOSIS — N3000 Acute cystitis without hematuria: Secondary | ICD-10-CM

## 2021-08-03 DIAGNOSIS — F409 Phobic anxiety disorder, unspecified: Secondary | ICD-10-CM

## 2021-08-03 DIAGNOSIS — M818 Other osteoporosis without current pathological fracture: Secondary | ICD-10-CM | POA: Diagnosis not present

## 2021-08-03 DIAGNOSIS — F5105 Insomnia due to other mental disorder: Secondary | ICD-10-CM

## 2021-08-03 DIAGNOSIS — F419 Anxiety disorder, unspecified: Secondary | ICD-10-CM

## 2021-08-03 DIAGNOSIS — Z1231 Encounter for screening mammogram for malignant neoplasm of breast: Secondary | ICD-10-CM

## 2021-08-03 DIAGNOSIS — R69 Illness, unspecified: Secondary | ICD-10-CM | POA: Diagnosis not present

## 2021-08-03 NOTE — Patient Instructions (Signed)
Annual exam with MD in October when due , call if you need me sooner  Please sched December mammogram at checkout  Urine for c/S only today,( now on cedinir for UTI after was treated with Septra)  Right back pain is from arthritis I believe, use warm compress to back as needed for pain  Thanks for choosing [facility], we consider it a privelige to serve you.

## 2021-08-05 LAB — URINE CULTURE: Organism ID, Bacteria: NO GROWTH

## 2021-08-07 ENCOUNTER — Encounter: Payer: Self-pay | Admitting: Family Medicine

## 2021-08-07 DIAGNOSIS — N3000 Acute cystitis without hematuria: Secondary | ICD-10-CM | POA: Insufficient documentation

## 2021-08-07 NOTE — Assessment & Plan Note (Signed)
Urine to be sent for c/s on 2nd round of antibiotic for uTI

## 2021-08-07 NOTE — Assessment & Plan Note (Signed)
Managed by endo, t score of -2.5 in 2021 Daily weight bearing exercise encouraged

## 2021-08-07 NOTE — Assessment & Plan Note (Signed)
Controlled and on no medication

## 2021-08-07 NOTE — Progress Notes (Signed)
   Megan Gibbs     MRN: [id number]      DOB: June 11, 1951   HPI Megan Gibbs is here for follow up and re-evaluation of chronic medical conditions, medication management and review of any available recent lab and radiology data.  Preventive health is updated, specifically  Cancer screening and Immunization.   Currently on 2nd round of antibiotics from the UC for UTI, concerned that she may still have infection ROS Denies recent fever or chills. Denies sinus pressure, nasal congestion, ear pain or sore throat. Denies chest congestion, productive cough or wheezing. Denies chest pains, palpitations and leg swelling Denies abdominal pain, nausea, vomiting,diarrhea or constipation.   . Denies joint pain, swelling and limitation in mobility. Denies headaches, seizures, numbness, or tingling. Denies depression, anxiety or insomnia. Denies skin break down or rash.   PE  BP 133/78   Pulse 77   Ht '5\' 2"'$  (1.575 m)   Wt 107 lb 12 oz (48.9 kg)   BMI 19.71 kg/m   Patient alert and oriented and in no cardiopulmonary distress.  HEENT: No facial asymmetry, EOMI,     Neck supple .  Chest: Clear to auscultation bilaterally.  CVS: S1, S2 no murmurs, no S3.Regular rate.  ABD: Soft non tender.   Ext: No edema  MS: Adequate ROM spine, shoulders, hips and knees.  Skin: Intact, no ulcerations or rash noted.  Psych: Good eye contact, normal affect. Memory intact not anxious or depressed appearing.  CNS: CN 2-12 intact, power,  normal throughout.no focal deficits noted.   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

## 2021-08-08 ENCOUNTER — Other Ambulatory Visit: Payer: Self-pay

## 2021-08-08 ENCOUNTER — Encounter: Payer: Self-pay | Admitting: "Endocrinology

## 2021-08-08 ENCOUNTER — Ambulatory Visit (INDEPENDENT_AMBULATORY_CARE_PROVIDER_SITE_OTHER): Payer: Medicare HMO | Admitting: "Endocrinology

## 2021-08-08 VITALS — BP 110/64 | HR 76 | Ht 62.0 in | Wt 107.2 lb

## 2021-08-08 DIAGNOSIS — E0590 Thyrotoxicosis, unspecified without thyrotoxic crisis or storm: Secondary | ICD-10-CM | POA: Insufficient documentation

## 2021-08-08 NOTE — Progress Notes (Signed)
08/08/2021     Endocrinology follow-up note   Subjective:    Patient ID: Megan Gibbs, female    DOB: October 14, 1951,    Past Medical History:  Diagnosis Date   Allergic rhinitis    Anemia    Anxiety    Back pain    Depression    Hypokalemia    Osteoporosis    Prediabetes    Seasonal allergies    Vertigo    Wears glasses    Past Surgical History:  Procedure Laterality Date   BACK SURGERY  1992 & 2009   Dr. Joya Salm    BREAST BIOPSY     BREAST EXCISIONAL BIOPSY     BREAST LUMPECTOMY WITH RADIOACTIVE SEED LOCALIZATION Right 12/01/2014   Procedure: RIGHT BREAST LUMPECTOMY WITH RADIOACTIVE SEED LOCALIZATION;  Surgeon: Fanny Skates, MD;  Location: [facility];  Service: General;  Laterality: Right;   COLONOSCOPY N/A 04/19/2014   Procedure: COLONOSCOPY;  Surgeon: Daneil Dolin, MD;  [facility];  Service: Endoscopy;  Laterality: N/A;  9:30 AM   DILATION AND CURETTAGE OF UTERUS     TUBAL LIGATION     1976   Social History   Socioeconomic History   Marital status: Divorced    Spouse name: Not on file   Number of children: 3   Years of education: Not on file   Highest education level: Not on file  Occupational History   Occupation: disabled     Employer: UNEMPLOYED  Tobacco Use   Smoking status: Never   Smokeless tobacco: Never  Substance and Sexual Activity   Alcohol use: No   Drug use: No   Sexual activity: Yes    Birth control/protection: Post-menopausal  Other Topics Concern   Not on file  Social History Narrative   DOING A LOT OF [place].    Social Determinants of Health   Financial Resource Strain: Low Risk    Difficulty of Paying Living Expenses: Not hard at all  Food Insecurity: No Food Insecurity   Worried About Charity fundraiser in the Last Year: Never true   [place] in the Last Year: Never true  Transportation Needs: No Transportation Needs   Lack of Transportation (Medical): No   Lack of  Transportation (Non-Medical): No  Physical Activity: Inactive   Days of Exercise per Week: 0 days   Minutes of Exercise per Session: 0 min  Stress: No Stress Concern Present   Feeling of Stress : Not at all  Social Connections: Moderately Isolated   Frequency of Communication with Friends and Family: More than three times a week   Frequency of Social Gatherings with Friends and Family: More than three times a week   Attends Religious Services: More than 4 times per year   Active Member of [employer] or Organizations: No   Attends Archivist Meetings: Never   Marital Status: Divorced   Outpatient Encounter Medications as of 08/08/2021  Medication Sig   alendronate (FOSAMAX) 70 MG tablet TAKE 1 TAB WEEKLY 30 MINS. BEFORE BREAKFAST WITH 8 OZ OF WATER FOR OSTEOPOROSIS.   Calcium Carb-Cholecalciferol 600-200 MG-UNIT TABS Take 1 tablet by mouth daily.  (Patient not taking: Reported on 08/08/2021)   fexofenadine (ALLEGRA ALLERGY) 180 MG tablet Take 1 tablet (180 mg total) by mouth daily. (Patient not taking: Reported on 08/08/2021)   fluticasone (FLONASE) 50 MCG/ACT nasal spray Place 2 sprays into both nostrils daily. (Patient not taking: Reported on  08/08/2021)   omega-3 fish oil (MAXEPA) 1000 MG CAPS capsule Take 2 capsules by mouth daily.    No facility-administered encounter medications on file as of 08/08/2021.   ALLERGIES: No Known Allergies VACCINATION STATUS: Immunization History  Administered Date(s) Administered   Fluad Quad(high Dose 65+) 09/14/2019, 10/24/2020   Influenza Split 09/24/2012   Influenza Whole 10/04/2009, 09/29/2010, 09/17/2011   Influenza,inj,Quad PF,6+ Mos 10/29/2013, 10/11/2014, 09/20/2015, 08/20/2016, 09/17/2017, 09/18/2018   Moderna SARS-COV2 Booster Vaccination 07/04/2021   Moderna Sars-Covid-2 Vaccination 03/09/2020, 04/10/2020, 11/10/2020   Pneumococcal Conjugate-13 03/17/2015   Pneumococcal Polysaccharide-23 12/18/2016   Td 03/24/2010   Zoster, Live  09/20/2011    HPI  70 yr old female with medical hx as above.  She is following in this clinic for management of osteoporosis.  She is on Fosamax 70 mg p.o. weekly since 2015.  She has no complaints regarding her treatment for osteoporosis.  She is coming this time due to abnormal thyroid function tests.  She was found to have Suppressed TSH on July 25, 2021.  Her free T4 and free T3 are drawn later and still pending.  She does not have any previous thyroid dysfunction.  She admits to have been taking supplements with large quantities of Biotin. She denies palpitations, tremors, nor heat intolerance.  She is not on treatment with antithyroid medications.  She underwent previsit DEXA scan which generally shows significant improvement from 2011-2021, in all areas scanned spine, hips, and femuri . She has had lumbar disc prolapse for which she underwent surgery in 2002 and 2009. She lost 3 pounds since last visit.  She went to menopause in her 45s.   has 3 grown children. she denies parathyroid, thyroid dysfunction.  She denies history of fragility fracture.  She remains on vitamin D and low-dose calcium supplement.  Review of Systems Limited as above.  Objective:    BP 110/64   Pulse 76   Ht '5\' 2"'$  (1.575 m)   Wt 107 lb 3.2 oz (48.6 kg)   BMI 19.61 kg/m   Wt Readings from Last 3 Encounters:  08/08/21 107 lb 3.2 oz (48.6 kg)  08/03/21 107 lb 12 oz (48.9 kg)  07/20/21 110 lb (49.9 kg)    Physical Exam     Results for orders placed or performed in visit on 08/03/21  Urine Culture   Specimen: Urine   UR  Result Value Ref Range   Urine Culture, Routine Final report    Organism ID, Bacteria No growth    Complete Blood Count (Most recent): Lab Results  Component Value Date   WBC 4.5 07/25/2021   HGB 12.6 07/25/2021   HCT 38.9 07/25/2021   MCV 79 07/25/2021   PLT 213 07/25/2021   Chemistry (most recent): Lab Results  Component Value Date   NA 142 07/25/2021   K 5.3 (H)  07/25/2021   CL 103 07/25/2021   CO2 20 07/25/2021   BUN 10 07/25/2021   CREATININE 0.68 07/25/2021   Diabetic Labs (most recent): Lab Results  Component Value Date   HGBA1C 5.5 12/11/2016   HGBA1C 5.6 12/12/2015   HGBA1C 5.6 06/21/2015   Lipid profile (most recent): Lab Results  Component Value Date   TRIG 92 07/25/2021   CHOL 170 07/25/2021     Assessment & Plan:  1.  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 She will not need antithyroid intervention now.  After total of 15 days off of Biotin, she will have full profile thyroid function test.  She  will be contacted if labs are abnormal for further work-up/treatment. 2. 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  Previsit DEXA scan from May 17, 2020 showed continued progressive improvement compared to her last DEXA scan readings from 2011-2021 in spine, hips, and femuri. She is tolerating Fosamax, advised to continue 70 mg weekly.  She is advised on side effects and precautions. She was started on Fosamax in 2015. -She has no interval fragility fracture.  This is  a good development for her since she has not lost significant bone density since she was started on Fosamax in 2015.  Her PTH is normal along with calcium and TFTs, ruling out secondary cause of osteoporosis. She is made aware of the possibility of treating her with Fosamax for 5 to 8 years before considering drug holiday.   Risk of untreated osteoporosis is discussed with her in detail.  Her next bone density is due in May 2023.  3.  Vitamin D deficiency -She is now vitamin D replete at 64.3.  She is status post vitamin D2 50,000 units weekly.  She will continue to maintain with vitamin D3 600/200 mg p.o. daily.  I advised patient to maintain close follow up with their PCP for primary care needs.   I spent 25 minutes in the care of the patient today including review of labs from Thyroid Function, CMP, and other  relevant labs ; imaging/biopsy records (current and previous including abstractions from other facilities); face-to-face time discussing  her lab results and symptoms, medications doses, her options of short and long term treatment based on the latest standards of care / guidelines;   and documenting the encounter.  Megan Gibbs  participated in the discussions, expressed understanding, and voiced agreement with the above plans.  All questions were answered to her satisfaction. she is encouraged to contact clinic should she have any questions or concerns prior to her return visit.    Follow up plan: Return labs after 08/19/2021, will call her if abnormal., for Keep Reg. Appt. with Pre-visit Labs.  Glade Lloyd, MD Phone: [phone]  Fax: [phone]  -  This note was partially dictated with voice recognition software. Similar sounding words can be transcribed inadequately or may not  be corrected upon review.  08/08/2021, 9:18 AM

## 2021-08-16 ENCOUNTER — Other Ambulatory Visit: Payer: Self-pay | Admitting: Nurse Practitioner

## 2021-08-16 DIAGNOSIS — E0590 Thyrotoxicosis, unspecified without thyrotoxic crisis or storm: Secondary | ICD-10-CM

## 2021-08-16 LAB — T4, FREE

## 2021-08-16 LAB — SPECIMEN STATUS REPORT

## 2021-08-16 LAB — T3, FREE

## 2021-08-16 NOTE — Progress Notes (Signed)
Her labs were cancelled. Not sure why, maybe insufficient blood for the test. Can you get her to swing by the office and have labs drawn (already ordered) for hyperthyroidism. She has seen Dr. Dorris Fetch in the past, but needs to see him urgently, but her referral was denied because she saw him a while ago for osteoporosis. Please get her an expedited appointment.

## 2021-08-17 NOTE — Progress Notes (Signed)
Kelli[place]. No need to get her to come back for labs if [facility] already drew them.

## 2021-08-23 DIAGNOSIS — E0590 Thyrotoxicosis, unspecified without thyrotoxic crisis or storm: Secondary | ICD-10-CM | POA: Diagnosis not present

## 2021-08-24 ENCOUNTER — Other Ambulatory Visit: Payer: Self-pay | Admitting: "Endocrinology

## 2021-08-24 DIAGNOSIS — E0590 Thyrotoxicosis, unspecified without thyrotoxic crisis or storm: Secondary | ICD-10-CM

## 2021-08-24 LAB — T4, FREE: Free T4: 1.65 ng/dL (ref 0.82–1.77)

## 2021-08-24 LAB — THYROID PEROXIDASE ANTIBODY: Thyroperoxidase Ab SerPl-aCnc: 271 IU/mL — ABNORMAL HIGH (ref 0–34)

## 2021-08-24 LAB — TSH: TSH: <0.005 u[IU]/mL — ABNORMAL LOW (ref 0.450–4.500)

## 2021-08-24 LAB — T3, FREE: T3, Free: 4.8 pg/mL — ABNORMAL HIGH (ref 2.0–4.4)

## 2021-08-24 LAB — THYROGLOBULIN ANTIBODY: Thyroglobulin Antibody: 1.3 IU/mL — ABNORMAL HIGH (ref 0.0–0.9)

## 2021-08-28 ENCOUNTER — Telehealth: Payer: Self-pay

## 2021-08-28 NOTE — Telephone Encounter (Signed)
Pt said she has not heard from [facility] regarding her uptake and scan

## 2021-09-06 ENCOUNTER — Encounter (HOSPITAL_COMMUNITY)
Admission: RE | Admit: 2021-09-06 | Discharge: 2021-09-06 | Disposition: A | Discharge status: Home / Self Care | Payer: Medicare HMO | Source: Ambulatory Visit | Attending: "Endocrinology | Admitting: "Endocrinology

## 2021-09-06 ENCOUNTER — Other Ambulatory Visit: Payer: Self-pay

## 2021-09-06 DIAGNOSIS — E0590 Thyrotoxicosis, unspecified without thyrotoxic crisis or storm: Secondary | ICD-10-CM | POA: Insufficient documentation

## 2021-09-06 MED ORDER — SODIUM IODIDE I 131 CAPSULE
8.9000 | Freq: Once | INTRAVENOUS | Status: AC | PRN
  Administered 2021-09-06: 8.9 via ORAL

## 2021-09-07 ENCOUNTER — Encounter (HOSPITAL_COMMUNITY)
Admission: RE | Admit: 2021-09-07 | Discharge: 2021-09-07 | Disposition: A | Discharge status: Home / Self Care | Payer: Medicare HMO | Source: Ambulatory Visit | Attending: "Endocrinology | Admitting: "Endocrinology

## 2021-09-07 DIAGNOSIS — E0590 Thyrotoxicosis, unspecified without thyrotoxic crisis or storm: Secondary | ICD-10-CM | POA: Diagnosis not present

## 2021-09-07 DIAGNOSIS — R634 Abnormal weight loss: Secondary | ICD-10-CM | POA: Diagnosis not present

## 2021-09-07 DIAGNOSIS — K5900 Constipation, unspecified: Secondary | ICD-10-CM | POA: Diagnosis not present

## 2021-09-07 MED ORDER — SODIUM PERTECHNETATE TC 99M INJECTION
5.0000 | Freq: Once | INTRAVENOUS | Status: AC | PRN
  Administered 2021-09-07: 5.2 via INTRAVENOUS

## 2021-09-14 ENCOUNTER — Ambulatory Visit (INDEPENDENT_AMBULATORY_CARE_PROVIDER_SITE_OTHER): Payer: Medicare HMO | Admitting: "Endocrinology

## 2021-09-14 ENCOUNTER — Encounter: Payer: Self-pay | Admitting: "Endocrinology

## 2021-09-14 VITALS — BP 122/74 | HR 68 | Ht 62.0 in | Wt 107.2 lb

## 2021-09-14 DIAGNOSIS — E0590 Thyrotoxicosis, unspecified without thyrotoxic crisis or storm: Secondary | ICD-10-CM | POA: Diagnosis not present

## 2021-09-14 DIAGNOSIS — M810 Age-related osteoporosis without current pathological fracture: Secondary | ICD-10-CM

## 2021-09-14 NOTE — Progress Notes (Signed)
09/14/2021     Endocrinology follow-up note   Subjective:    Patient ID: Megan Gibbs, female    DOB: [birth year],    Past Medical History:  Diagnosis Date   Allergic rhinitis    Anemia    Anxiety    Back pain    Depression    Hypokalemia    Osteoporosis    Prediabetes    Seasonal allergies    Vertigo    Wears glasses    Past Surgical History:  Procedure Laterality Date   BACK SURGERY  1992 & 2009   Dr. Joya Salm    BREAST BIOPSY     BREAST EXCISIONAL BIOPSY     BREAST LUMPECTOMY WITH RADIOACTIVE SEED LOCALIZATION Right 12/01/2014   Procedure: RIGHT BREAST LUMPECTOMY WITH RADIOACTIVE SEED LOCALIZATION;  Surgeon: Fanny Skates, MD;  Location: [facility];  Service: General;  Laterality: Right;   COLONOSCOPY N/A 04/19/2014   Procedure: COLONOSCOPY;  Surgeon: Daneil Dolin, MD;  [facility];  Service: Endoscopy;  Laterality: N/A;  9:30 AM   DILATION AND CURETTAGE OF UTERUS     TUBAL LIGATION     1976   Social History   Socioeconomic History   Marital status: Divorced    Spouse name: Not on file   Number of children: 3   Years of education: Not on file   Highest education level: Not on file  Occupational History   Occupation: disabled     Employer: UNEMPLOYED  Tobacco Use   Smoking status: Never   Smokeless tobacco: Never  Substance and Sexual Activity   Alcohol use: No   Drug use: No   Sexual activity: Yes    Birth control/protection: Post-menopausal  Other Topics Concern   Not on file  Social History Narrative   DOING A LOT OF [place].    Social Determinants of Health   Financial Resource Strain: Low Risk    Difficulty of Paying Living Expenses: Not hard at all  Food Insecurity: No Food Insecurity   Worried About Charity fundraiser in the Last Year: Never true   [place] in the Last Year: Never true  Transportation Needs: No Transportation Needs   Lack of Transportation (Medical): No   Lack of  Transportation (Non-Medical): No  Physical Activity: Inactive   Days of Exercise per Week: 0 days   Minutes of Exercise per Session: 0 min  Stress: No Stress Concern Present   Feeling of Stress : Not at all  Social Connections: Moderately Isolated   Frequency of Communication with Friends and Family: More than three times a week   Frequency of Social Gatherings with Friends and Family: More than three times a week   Attends Religious Services: More than 4 times per year   Active Member of [employer] or Organizations: No   Attends Archivist Meetings: Never   Marital Status: Divorced   Outpatient Encounter Medications as of 09/14/2021  Medication Sig   fexofenadine (ALLEGRA ALLERGY) 180 MG tablet Take 1 tablet (180 mg total) by mouth daily. (Patient taking differently: Take 180 mg by mouth daily as needed.)   fluticasone (FLONASE) 50 MCG/ACT nasal spray Place 2 sprays into both nostrils daily. (Patient taking differently: Place 2 sprays into both nostrils daily as needed.)   Multiple Vitamins-Minerals (MULTIVITAMIN ADULTS 50+ PO) Take 1 tablet by mouth daily.   alendronate (FOSAMAX) 70 MG tablet TAKE 1 TAB WEEKLY 30 MINS. BEFORE BREAKFAST WITH  8 OZ OF WATER FOR OSTEOPOROSIS.   Calcium Carb-Cholecalciferol 600-200 MG-UNIT TABS Take 1 tablet by mouth daily.  (Patient not taking: Reported on 09/14/2021)   omega-3 fish oil (MAXEPA) 1000 MG CAPS capsule Take 2 capsules by mouth daily.    No facility-administered encounter medications on file as of 09/14/2021.   ALLERGIES: No Known Allergies VACCINATION STATUS: Immunization History  Administered Date(s) Administered   Fluad Quad(high Dose 65+) 09/14/2019, 10/24/2020   Influenza Split 09/24/2012   Influenza Whole 10/04/2009, 09/29/2010, 09/17/2011   Influenza,inj,Quad PF,6+ Mos 10/29/2013, 10/11/2014, 09/20/2015, 08/20/2016, 09/17/2017, 09/18/2018   Moderna SARS-COV2 Booster Vaccination 07/04/2021   Moderna Sars-Covid-2 Vaccination  03/09/2020, 04/10/2020, 11/10/2020   Pneumococcal Conjugate-13 03/17/2015   Pneumococcal Polysaccharide-23 12/18/2016   Td 03/24/2010   Zoster, Live 09/20/2011    HPI  70 yr old female with medical hx as above.  She is following in this clinic for management of osteoporosis.  She is on Fosamax 70 mg p.o. weekly since 2015.  She has no complaints regarding her treatment for osteoporosis.  She underwent previsit DEXA scan which generally shows significant improvement from 2011-2021, in all areas scanned spine, hips, and femuri . She has had lumbar disc prolapse for which she underwent surgery in 2002 and 2009.  Recently she was seen due to abnormal thyroid function tests.  Her repeat thyroid function tests were consistent with hyperthyroidism.  Her subsequent thyroid uptake and scan showed generally suppressed uptake however possible hyperfunctioning nodule in the left lobe.  Ultrasound of the thyroid was suggested.   She explains inability to keep her weight, supplements her diet with boosts and Ensure.  Her oral intake of solid food is not optimal.   She denies dysphagia, shortness of breath, nor voice change.  She maintain her weight at 107 pounds since last visit.  She  has 3 grown children. she denies parathyroid, thyroid dysfunction.  She denies history of fragility fracture.  She remains on vitamin D and low-dose calcium supplement.  Review of Systems Limited as above.  Objective:    BP 122/74   Pulse 68   Ht '5\' 2"'$  (1.575 m)   Wt 107 lb 3.2 oz (48.6 kg)   BMI 19.61 kg/m   Wt Readings from Last 3 Encounters:  09/14/21 107 lb 3.2 oz (48.6 kg)  08/08/21 107 lb 3.2 oz (48.6 kg)  08/03/21 107 lb 12 oz (48.9 kg)    Physical Exam     Results for orders placed or performed in visit on 08/08/21  TSH  Result Value Ref Range   TSH <0.005 (L) 0.450 - 4.500 uIU/mL  T4, free  Result Value Ref Range   Free T4 1.65 0.82 - 1.77 ng/dL  T3, free  Result Value Ref Range   T3, Free  4.8 (H) 2.0 - 4.4 pg/mL  Thyroid peroxidase antibody  Result Value Ref Range   Thyroperoxidase Ab SerPl-aCnc 271 (H) 0 - 34 IU/mL  Thyroglobulin antibody  Result Value Ref Range   Thyroglobulin Antibody 1.3 (H) 0.0 - 0.9 IU/mL   Complete Blood Count (Most recent): Lab Results  Component Value Date   WBC 4.5 07/25/2021   HGB 12.6 07/25/2021   HCT 38.9 07/25/2021   MCV 79 07/25/2021   PLT 213 07/25/2021   Chemistry (most recent): Lab Results  Component Value Date   NA 142 07/25/2021   K 5.3 (H) 07/25/2021   CL 103 07/25/2021   CO2 20 07/25/2021   BUN 10 07/25/2021   CREATININE 0.68  07/25/2021   Diabetic Labs (most recent): Lab Results  Component Value Date   HGBA1C 5.5 12/11/2016   HGBA1C 5.6 12/12/2015   HGBA1C 5.6 06/21/2015   Lipid profile (most recent): Lab Results  Component Value Date   TRIG 92 07/25/2021   CHOL 170 07/25/2021     Assessment & Plan:  1. hyperthyroidism-likely due to hyperfunctioning nodule in the left lobe.   Before she will be considered for I-131 thyroid ablation, she will need thyroid ultrasound to characterize her thyroid anatomy better.  If she is found to have significantly abnormal/cold thyroid nodule, she would be considered for fine-needle aspiration biopsy.  2. 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  Previsit DEXA scan from May 17, 2020 showed continued progressive improvement compared to her last DEXA scan readings from 2011-2021 in spine, hips, and femuri. She is tolerating Fosamax, advised to continue 70 mg weekly.  She is advised on side effects and precautions. She was started on Fosamax in 2015. -She has no recent fragility fracture.    This is  a good development for her since she has not lost significant bone density since she was started on Fosamax in 2015.  Her PTH is normal along with calcium and TFTs, ruling out secondary cause of osteoporosis. She is made aware of the possibility  of treating her with Fosamax for 5 to 8 years before considering drug holiday.   Risk of untreated osteoporosis is discussed with her in detail.  Her next bone density is due in May 2023.  3.  Vitamin D deficiency -She is now vitamin D replete at 64.3.  She is status post vitamin D2 50,000 units weekly.  She will continue to maintain with vitamin D3 600/200 mg p.o. daily.  I advised patient to maintain close follow up with their PCP for primary care needs.   I spent 21 minutes in the care of the patient today including review of labs from Thyroid Function, CMP, and other relevant labs ; imaging/biopsy records (current and previous including abstractions from other facilities); face-to-face time discussing  her lab results and symptoms, medications doses, her options of short and long term treatment based on the latest standards of care / guidelines;   and documenting the encounter.  Megan Gibbs  participated in the discussions, expressed understanding, and voiced agreement with the above plans.  All questions were answered to her satisfaction. she is encouraged to contact clinic should she have any questions or concerns prior to her return visit.    Follow up plan: Return in about 1 week (around 09/21/2021) for Thyroid / Neck Ultrasound.  Glade Lloyd, MD Phone: [phone]  Fax: [phone]  -  This note was partially dictated with voice recognition software. Similar sounding words can be transcribed inadequately or may not  be corrected upon review.  09/14/2021, 11:21 AM

## 2021-09-20 ENCOUNTER — Other Ambulatory Visit: Payer: Self-pay

## 2021-09-20 ENCOUNTER — Ambulatory Visit (HOSPITAL_COMMUNITY)
Admission: RE | Admit: 2021-09-20 | Discharge: 2021-09-20 | Disposition: A | Discharge status: Home / Self Care | Payer: Medicare HMO | Source: Ambulatory Visit | Attending: "Endocrinology | Admitting: "Endocrinology

## 2021-09-20 DIAGNOSIS — E0590 Thyrotoxicosis, unspecified without thyrotoxic crisis or storm: Secondary | ICD-10-CM | POA: Insufficient documentation

## 2021-09-21 ENCOUNTER — Ambulatory Visit (INDEPENDENT_AMBULATORY_CARE_PROVIDER_SITE_OTHER): Payer: Medicare HMO | Admitting: "Endocrinology

## 2021-09-21 ENCOUNTER — Ambulatory Visit: Payer: Medicare HMO | Admitting: Orthopaedic Surgery

## 2021-09-21 ENCOUNTER — Encounter: Payer: Self-pay | Admitting: "Endocrinology

## 2021-09-21 VITALS — BP 112/68 | HR 76 | Ht 62.0 in | Wt 106.6 lb

## 2021-09-21 DIAGNOSIS — M810 Age-related osteoporosis without current pathological fracture: Secondary | ICD-10-CM | POA: Diagnosis not present

## 2021-09-21 DIAGNOSIS — E0510 Thyrotoxicosis with toxic single thyroid nodule without thyrotoxic crisis or storm: Secondary | ICD-10-CM | POA: Diagnosis not present

## 2021-09-21 DIAGNOSIS — E0590 Thyrotoxicosis, unspecified without thyrotoxic crisis or storm: Secondary | ICD-10-CM

## 2021-09-21 NOTE — Progress Notes (Signed)
09/21/2021     Endocrinology follow-up note   Subjective:    Patient ID: Megan Gibbs, female    DOB: September 02, 1951,    Past Medical History:  Diagnosis Date   Allergic rhinitis    Anemia    Anxiety    Back pain    Depression    Hypokalemia    Osteoporosis    Prediabetes    Seasonal allergies    Vertigo    Wears glasses    Past Surgical History:  Procedure Laterality Date   BACK SURGERY  1992 & 2009   Dr. Joya Salm    BREAST BIOPSY     BREAST EXCISIONAL BIOPSY     BREAST LUMPECTOMY WITH RADIOACTIVE SEED LOCALIZATION Right 12/01/2014   Procedure: RIGHT BREAST LUMPECTOMY WITH RADIOACTIVE SEED LOCALIZATION;  Surgeon: Fanny Skates, MD;  Location: [facility];  Service: General;  Laterality: Right;   COLONOSCOPY N/A 04/19/2014   Procedure: COLONOSCOPY;  Surgeon: Daneil Dolin, MD;  [facility];  Service: Endoscopy;  Laterality: N/A;  9:30 AM   DILATION AND CURETTAGE OF UTERUS     TUBAL LIGATION     1976   Social History   Socioeconomic History   Marital status: Divorced    Spouse name: Not on file   Number of children: 3   Years of education: Not on file   Highest education level: Not on file  Occupational History   Occupation: disabled     Employer: UNEMPLOYED  Tobacco Use   Smoking status: Never   Smokeless tobacco: Never  Substance and Sexual Activity   Alcohol use: No   Drug use: No   Sexual activity: Yes    Birth control/protection: Post-menopausal  Other Topics Concern   Not on file  Social History Narrative   DOING A LOT OF [place].    Social Determinants of Health   Financial Resource Strain: Low Risk    Difficulty of Paying Living Expenses: Not hard at all  Food Insecurity: No Food Insecurity   Worried About Charity fundraiser in the Last Year: Never true   [place] in the Last Year: Never true  Transportation Needs: No Transportation Needs   Lack of Transportation (Medical): No   Lack of  Transportation (Non-Medical): No  Physical Activity: Inactive   Days of Exercise per Week: 0 days   Minutes of Exercise per Session: 0 min  Stress: No Stress Concern Present   Feeling of Stress : Not at all  Social Connections: Moderately Isolated   Frequency of Communication with Friends and Family: More than three times a week   Frequency of Social Gatherings with Friends and Family: More than three times a week   Attends Religious Services: More than 4 times per year   Active Member of [employer] or Organizations: No   Attends Archivist Meetings: Never   Marital Status: Divorced   Outpatient Encounter Medications as of 09/21/2021  Medication Sig   Multiple Vitamins-Minerals ([place] PO) Take 1 tablet by mouth daily.   alendronate (FOSAMAX) 70 MG tablet TAKE 1 TAB WEEKLY 30 MINS. BEFORE BREAKFAST WITH 8 OZ OF WATER FOR OSTEOPOROSIS.   fexofenadine (ALLEGRA ALLERGY) 180 MG tablet Take 1 tablet (180 mg total) by mouth daily. (Patient taking differently: Take 180 mg by mouth daily as needed.)   fluticasone (FLONASE) 50 MCG/ACT nasal spray Place 2 sprays into both nostrils daily. (Patient taking differently: Place 2 sprays into both  nostrils daily as needed.)   Multiple Vitamins-Minerals (MULTIVITAMIN ADULTS 50+ PO) Take 1 tablet by mouth daily.   omega-3 fish oil (MAXEPA) 1000 MG CAPS capsule Take 2 capsules by mouth daily.    [DISCONTINUED] Calcium Carb-Cholecalciferol 600-200 MG-UNIT TABS Take 1 tablet by mouth daily.  (Patient not taking: Reported on 09/14/2021)   No facility-administered encounter medications on file as of 09/21/2021.   ALLERGIES: No Known Allergies VACCINATION STATUS: Immunization History  Administered Date(s) Administered   Fluad Quad(high Dose 65+) 09/14/2019, 10/24/2020   Influenza Split 09/24/2012   Influenza Whole 10/04/2009, 09/29/2010, 09/17/2011   Influenza,inj,Quad PF,6+ Mos 10/29/2013, 10/11/2014, 09/20/2015, 08/20/2016, 09/17/2017, 09/18/2018    Moderna SARS-COV2 Booster Vaccination 07/04/2021   Moderna Sars-Covid-2 Vaccination 03/09/2020, 04/10/2020, 11/10/2020   Pneumococcal Conjugate-13 03/17/2015   Pneumococcal Polysaccharide-23 12/18/2016   Td 03/24/2010   Zoster, Live 09/20/2011    HPI  70 yr old female with medical hx as above.  She is following in this clinic for management of osteoporosis.  She is on Fosamax 70 mg p.o. weekly since 2015.  She has no complaints regarding her treatment for osteoporosis.  She underwent previsit DEXA scan which generally shows significant improvement from 2011-2021, in all areas scanned : spine, hips, and femuri . She has had lumbar disc prolapse for which she underwent surgery in 2002 and 2009.  Recently she was seen due to abnormal thyroid function tests.  Her repeat thyroid function tests were consistent with hyperthyroidism.  Her subsequent thyroid uptake and scan showed generally suppressed uptake however possible hyperfunctioning nodule in the left lobe.  Ultrasound of the thyroid was suggested.  Her ultrasound from September 20, 2021 did not show discrete nodules. She explains inability to keep her weight, supplements her diet with boosts and Ensure.  Her oral intake of solid food is not optimal.   She denies dysphagia, shortness of breath, nor voice change.  She maintain her weight at 107 pounds since last visit.  She  has 3 grown children. she denies parathyroid, thyroid dysfunction.  She denies history of fragility fracture.  She remains on vitamin D and low-dose calcium supplement.  Review of Systems Limited as above.  Objective:    BP 112/68   Pulse 76   Ht 5\' 2"  (1.575 m)   Wt 106 lb 9.6 oz (48.4 kg)   BMI 19.50 kg/m   Wt Readings from Last 3 Encounters:  09/21/21 106 lb 9.6 oz (48.4 kg)  09/14/21 107 lb 3.2 oz (48.6 kg)  08/08/21 107 lb 3.2 oz (48.6 kg)    Physical Exam     Results for orders placed or performed in visit on 08/08/21  TSH  Result Value Ref Range    TSH <0.005 (L) 0.450 - 4.500 uIU/mL  T4, free  Result Value Ref Range   Free T4 1.65 0.82 - 1.77 ng/dL  T3, free  Result Value Ref Range   T3, Free 4.8 (H) 2.0 - 4.4 pg/mL  Thyroid peroxidase antibody  Result Value Ref Range   Thyroperoxidase Ab SerPl-aCnc 271 (H) 0 - 34 IU/mL  Thyroglobulin antibody  Result Value Ref Range   Thyroglobulin Antibody 1.3 (H) 0.0 - 0.9 IU/mL   Complete Blood Count (Most recent): Lab Results  Component Value Date   WBC 4.5 07/25/2021   HGB 12.6 07/25/2021   HCT 38.9 07/25/2021   MCV 79 07/25/2021   PLT 213 07/25/2021   Chemistry (most recent): Lab Results  Component Value Date   NA 142 07/25/2021  K 5.3 (H) 07/25/2021   CL 103 07/25/2021   CO2 20 07/25/2021   BUN 10 07/25/2021   CREATININE 0.68 07/25/2021   Diabetic Labs (most recent): Lab Results  Component Value Date   HGBA1C 5.5 12/11/2016   HGBA1C 5.6 12/12/2015   HGBA1C 5.6 06/21/2015   Lipid profile (most recent): Lab Results  Component Value Date   TRIG 92 07/25/2021   CHOL 170 07/25/2021     Assessment & Plan:  1. hyperthyroidism-likely due to hyperfunctioning nodule in the left lobe.   Her uptake and scan was suspicious for cold area in the left side of her thyroid, however subsequent thyroid ultrasound did not show any discrete nodule.  She will not need any biopsy.  This patient would benefit from ablative treatment for hyperthyroidism from toxic nodule.  I discussed and arranged this treatment to be administered as soon as possible in [facility].  Patient is made aware of the fact that more than likely she will develop postablative hypothyroidism which will need Synthroid treatment for life. She will return in 9 weeks with repeat thyroid function tests.   2. 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  Previsit DEXA scan from May 17, 2020 showed continued progressive improvement compared to her last DEXA scan readings  from 2011-2021 in spine, hips, and femuri. She is tolerating Fosamax, advised to continue 70 mg weekly.  She is advised on side effects and precautions. She was started on Fosamax in 2015. -She has no recent fragility fracture.    This is  a good development for her since she has not lost significant bone density since she was started on Fosamax in 2015.  Her PTH is normal along with calcium and TFTs, ruling out secondary cause of osteoporosis. She is made aware of the possibility of treating her with Fosamax for 5 to 8 years before considering drug holiday.   Risk of untreated osteoporosis is discussed with her in detail.  Her next bone density is due in May 2023.    3.  Vitamin D deficiency -She is now vitamin D replete at 64.3.  She is status post vitamin D2 50,000 units weekly.  She will continue to maintain with vitamin D3 600/200 mg p.o. daily.  I advised patient to maintain close follow up with their PCP for primary care needs.  I spent 25 minutes in the care of the patient today including review of labs from Thyroid Function, CMP, and other relevant labs ; imaging/biopsy records (current and previous including abstractions from other facilities); face-to-face time discussing  her lab results and symptoms, medications doses, her options of short and long term treatment based on the latest standards of care / guidelines;   and documenting the encounter.  Megan Gibbs  participated in the discussions, expressed understanding, and voiced agreement with the above plans.  All questions were answered to her satisfaction. she is encouraged to contact clinic should she have any questions or concerns prior to her return visit. .    Follow up plan: Return in about 9 weeks (around 11/23/2021) for F/U with Labs after I131 Therapy.  Glade Lloyd, MD Phone: [phone]  Fax: [phone]  -  This note was partially dictated with voice recognition software. Similar sounding words can be transcribed  inadequately or may not  be corrected upon review.  09/21/2021, 12:27 PM

## 2021-09-28 ENCOUNTER — Other Ambulatory Visit: Payer: Self-pay

## 2021-09-28 ENCOUNTER — Ambulatory Visit (INDEPENDENT_AMBULATORY_CARE_PROVIDER_SITE_OTHER): Payer: Medicare HMO | Admitting: Orthopaedic Surgery

## 2021-09-28 ENCOUNTER — Encounter: Payer: Self-pay | Admitting: Orthopaedic Surgery

## 2021-09-28 ENCOUNTER — Ambulatory Visit: Payer: Medicare HMO

## 2021-09-28 VITALS — BP 136/76 | HR 84 | Ht 62.0 in | Wt 108.6 lb

## 2021-09-28 DIAGNOSIS — G8929 Other chronic pain: Secondary | ICD-10-CM

## 2021-09-28 DIAGNOSIS — M25512 Pain in left shoulder: Secondary | ICD-10-CM

## 2021-09-28 DIAGNOSIS — M25551 Pain in right hip: Secondary | ICD-10-CM

## 2021-09-28 MED ORDER — NAPROXEN 500 MG PO TABS: 500.0000 mg | ORAL_TABLET | Freq: Two times a day (BID) | ORAL | 5 refills | Status: DC

## 2021-09-28 NOTE — Progress Notes (Signed)
Subjective:    Patient ID: Megan Gibbs, female    DOB: October 27, 1951, 70 y.o.   MRN: [id number]  HPI She has long history of left shoulder pain.  I saw her for this in 2018.  She has pain with overhead use and sleeping on it.  She has no trauma, no swelling, no weakness, no numbness.  She has tried Aleve and heat and ice which help some.  She saw Dr. Moshe Cipro about this recently and was asked to return here.  She has no neck pain.  She has some pain of the right lateral hip and pelvic brim.  She has no radiation of pain, no trauma, no swelling.   Review of Systems  Constitutional:  Positive for activity change.  Respiratory:  Positive for shortness of breath.   Musculoskeletal:  Positive for arthralgias, back pain and myalgias.  Allergic/Immunologic: Positive for environmental allergies.  Psychiatric/Behavioral:  The patient is nervous/anxious.   All other systems reviewed and are negative. For Review of Systems, all other systems reviewed and are negative.  The following is a summary of the past history medically, past history surgically, known current medicines, social history and family history.  This information is gathered electronically by the computer from prior information and documentation.  I review this each visit and have found including this information at this point in the chart is beneficial and informative.   Past Medical History:  Diagnosis Date   Allergic rhinitis    Anemia    Anxiety    Back pain    Depression    Hypokalemia    Osteoporosis    Prediabetes    Seasonal allergies    Vertigo    Wears glasses     Past Surgical History:  Procedure Laterality Date   BACK SURGERY  1992 & 2009   Dr. Joya Salm    BREAST BIOPSY     BREAST EXCISIONAL BIOPSY     BREAST LUMPECTOMY WITH RADIOACTIVE SEED LOCALIZATION Right 12/01/2014   Procedure: RIGHT BREAST LUMPECTOMY WITH RADIOACTIVE SEED LOCALIZATION;  Surgeon: Fanny Skates, MD;  Location: [facility];   Service: General;  Laterality: Right;   COLONOSCOPY N/A 04/19/2014   Procedure: COLONOSCOPY;  Surgeon: Daneil Dolin, MD;  Location: [facility];  Service: Endoscopy;  Laterality: N/A;  9:30 AM   DILATION AND CURETTAGE OF UTERUS     TUBAL LIGATION     1976    Current Outpatient Medications on File Prior to Visit  Medication Sig Dispense Refill   alendronate (FOSAMAX) 70 MG tablet TAKE 1 TAB WEEKLY 30 MINS. BEFORE BREAKFAST WITH 8 OZ OF WATER FOR OSTEOPOROSIS. 12 tablet 1   fexofenadine (ALLEGRA ALLERGY) 180 MG tablet Take 1 tablet (180 mg total) by mouth daily. (Patient taking differently: Take 180 mg by mouth daily as needed.) 90 tablet 1   fluticasone (FLONASE) 50 MCG/ACT nasal spray Place 2 sprays into both nostrils daily. (Patient taking differently: Place 2 sprays into both nostrils daily as needed.) 16 g 2   Multiple Vitamins-Minerals (AIRBORNE PO) Take 1 tablet by mouth daily.     Multiple Vitamins-Minerals (MULTIVITAMIN ADULTS 50+ PO) Take 1 tablet by mouth daily.     omega-3 fish oil (MAXEPA) 1000 MG CAPS capsule Take 2 capsules by mouth daily.      No current facility-administered medications on file prior to visit.    Social History   Socioeconomic History   Marital status: Divorced    Spouse name: Not on  file   Number of children: 3   Years of education: Not on file   Highest education level: Not on file  Occupational History   Occupation: disabled     Employer: UNEMPLOYED  Tobacco Use   Smoking status: Never   Smokeless tobacco: Never  Substance and Sexual Activity   Alcohol use: No   Drug use: No   Sexual activity: Yes    Birth control/protection: Post-menopausal  Other Topics Concern   Not on file  Social History Narrative   DOING A LOT OF [employer].    Social Determinants of Health   Financial Resource Strain: Low Risk    Difficulty of Paying Living Expenses: Not hard at all  Food Insecurity: No Food Insecurity   Worried About Paediatric nurse in the Last Year: Never true   [place] in the Last Year: Never true  Transportation Needs: No Transportation Needs   Lack of Transportation (Medical): No   Lack of Transportation (Non-Medical): No  Physical Activity: Inactive   Days of Exercise per Week: 0 days   Minutes of Exercise per Session: 0 min  Stress: No Stress Concern Present   Feeling of Stress : Not at all  Social Connections: Moderately Isolated   Frequency of Communication with Friends and Family: More than three times a week   Frequency of Social Gatherings with Friends and Family: More than three times a week   Attends Religious Services: More than 4 times per year   Active Member of [employer] or Organizations: No   Attends Archivist Meetings: Never   Marital Status: Divorced  Human resources officer Violence: Not At Risk   Fear of Current or Ex-Partner: No   Emotionally Abused: No   Physically Abused: No   Sexually Abused: No    Family History  Problem Relation Age of Onset   Diabetes Mother    Hypertension Mother    Asthma Sister    Thyroid disease Sister    COPD Sister    Anemia Sister    Diabetes Sister    Hypertension Sister    Diabetes Sister    Schizophrenia Father    Drug abuse Brother    Alcohol abuse Brother    Sleep apnea Grandchild    ADD / ADHD Grandchild    Seizures Grandchild    Breast cancer Sister 20   Thyroid disease Sister    Hypertension Brother    Anxiety disorder Maternal Aunt    Hypertension Daughter    BRCA 1/2 Daughter    Anemia Daughter    Hypertension Daughter    Asthma Daughter    Colon cancer Neg Hx     BP 136/76   Pulse 84   Ht _0  (1.575 m)   Wt 108 lb 9.6 oz (49.3 kg)   BMI 19.86 kg/m   Body mass index is 19.86 kg/m.     Objective:   Physical Exam Vitals and nursing note reviewed. Exam conducted with a chaperone present.  Constitutional:      Appearance: She is well-developed.  HENT:     Head: Normocephalic and atraumatic.   Eyes:     Conjunctiva/sclera: Conjunctivae normal.     Pupils: Pupils are equal, round, and reactive to light.  Cardiovascular:     Rate and Rhythm: Normal rate and regular rhythm.  Pulmonary:     Effort: Pulmonary effort is normal.  Abdominal:     Palpations: Abdomen is soft.  Musculoskeletal:       Arms:     Cervical back: Normal range of motion and neck supple.       Back:  Skin:    General: Skin is warm and dry.  Neurological:     Mental Status: She is alert and oriented to person, place, and time.     Cranial Nerves: No cranial nerve deficit.     Motor: No abnormal muscle tone.     Coordination: Coordination normal.     Deep Tendon Reflexes: Reflexes are normal and symmetric. Reflexes normal.  Psychiatric:        Behavior: Behavior normal.        Thought Content: Thought content normal.        Judgment: Judgment normal.  X-rays were done of the left shoulder, reported separately.        Assessment & Plan:   Encounter Diagnoses  Name Primary?   Chronic left shoulder pain Yes   Chronic right hip pain    PROCEDURE NOTE:  The patient request injection, verbal consent was obtained.  The left shoulder was prepped appropriately after time out was performed.   Sterile technique was observed and injection of 1 cc of Celestone 6 mg with several cc's of plain xylocaine. Anesthesia was provided by ethyl chloride and a 20-gauge needle was used to inject the shoulder area. A posterior approach was used.  The injection was tolerated well.  A band aid dressing was applied.  The patient was advised to apply ice later today and tomorrow to the injection sight as needed.  Begin PT in [place].  Begin Naprosyn 500 po bid pc  Return in one month.  Call if any problem.  Precautions discussed.  Electronically Signed Sanjuana Kava, MD 9/29/20228:27 AM

## 2021-09-28 NOTE — Addendum Note (Signed)
Addended by: Brand Males E on: 09/28/2021 09:00 AM   Modules accepted: Orders

## 2021-09-28 NOTE — Addendum Note (Signed)
Addended by: Willette Pa on: 09/28/2021 08:28 AM   Modules accepted: Orders

## 2021-09-29 ENCOUNTER — Telehealth: Payer: Self-pay

## 2021-09-29 NOTE — Telephone Encounter (Signed)
FYI:  Patient called wanting to change physical therapy location to [facility] in [place]. I couldn't get it put in on the referral.

## 2021-10-04 NOTE — Written Directive (Addendum)
MOLECULAR IMAGING AND THERAPEUTICS WRITTEN DIRECTIVE   PATIENT NAME: Megan Gibbs  PT DOB:   05-18-1951                                              MRN: [id number]  ---------------------------------------------------------------------------------------------------------------------   I-131 WHOLE THYROID THERAPY (NON-CANCER)    RADIOPHARMACEUTICAL:   Iodine-131 Capsule    PRESCRIBED DOSE FOR ADMINISTRATION:  50 millicuries p.o. (fifty millicuries)   ROUTE OFADMINISTRATION: PO   DIAGNOSIS:  Toxic thyroid nodule   REFERRING PHYSICIAN: Nida   TSH:    Lab Results  Component Value Date   TSH <0.005 (L) 08/23/2021   TSH <0.005 (L) 07/25/2021   TSH 2.14 05/09/2020     PRIOR I-131 THERAPY (Date and Dose):   PRIOR RADIOLOGY EXAMS (Results and Date): NM THYROID MULT UPTAKE W/IMAGING  Result Date: 09/07/2021 CLINICAL DATA:  Hyperthyroidism, suppressed TSH 0.005, nervousness, irritability, weight loss, cold intolerance, constipation, difficulty sleeping, hand tremors, increased perforation and fatigue EXAM: THYROID SCAN AND UPTAKE - 4 AND 24 HOURS TECHNIQUE: Following the per oral administration of I-131 sodium iodide, the patient returned at 4 and 24 hours and uptake measurements were acquired with the uptake probe centered on the neck. Thyroid imaging was performed following the intravenous administration of Tc-85m Pertechnetate. RADIOPHARMACEUTICALS:  5.1 mCi Technetium-77m pertechnetate IV and 8.9 microCuries I-131 sodium iodide orally COMPARISON:  None FINDINGS: Increased tracer uptake at inferior pole of LEFT lobe and generalized decreased tracer uptake throughout RIGHT lobe, may either represent a hyperfunctional nodule at the inferior pole of the LEFT lobe with suppression of uptake in remaining thyroid tissue versus large cold nodule RIGHT thyroid lobe. 4 hour I-131 uptake = 7% (normal 5-20%) 24 hour I-131 uptake = 13.7% (normal 10-30%) IMPRESSION: Normal 4 hour and 24 hour  radio iodine uptakes. Asymmetric tracer uptake, greater on LEFT particularly at inferior pole than on RIGHT. This could reflect either a hyperfunctional nodule at the inferior pole the LEFT lobe or enlarged cold nodule in the RIGHT lobe. Thyroid ultrasound assessment recommended. Electronically Signed   By: Lavonia Dana M.D.   On: 09/07/2021 12:49   US THYROID  Result Date: 09/20/2021 CLINICAL DATA:  Hyperthyroidism, asymmetric left thyroid uptake by nuclear medicine thyroid scan EXAM: THYROID ULTRASOUND TECHNIQUE: Ultrasound examination of the thyroid gland and adjacent soft tissues was performed. COMPARISON:  09/07/2021 FINDINGS: Parenchymal Echotexture: Moderately heterogenous Isthmus: 1.8 mm Right lobe: 3.4 x 0.9 x 0.9 cm Left lobe: 3.8 x 1.1 x 1.4 cm _________________________________________________________ Estimated total number of nodules >/= 1 cm: 0 Number of spongiform nodules >/=  2 cm not described below (TR1): 0 Number of mixed cystic and solid nodules >/= 1.5 cm not described below (TR2): 0 _________________________________________________________ Moderate thyroid heterogeneity with some degree of background bilateral pseudo nodularity throughout the thyroid gland. No discrete measurable nodule or focal abnormality. No hypervascularity. No regional adenopathy. IMPRESSION: Nonspecific thyroid heterogeneity compatible with medical thyroid disease. The above is in keeping with the ACR TI-RADS recommendations - J Am Coll Radiol 2017;14:587-595. Electronically Signed   By: Jerilynn Mages.  Shick M.D.   On: 09/20/2021 15:33      ADDITIONAL PHYSICIAN COMMENTS/NOTES   AUTHORIZED USER SIGNATURE & TIME STAMP:

## 2021-10-06 DIAGNOSIS — M25612 Stiffness of left shoulder, not elsewhere classified: Secondary | ICD-10-CM | POA: Diagnosis not present

## 2021-10-06 DIAGNOSIS — M25512 Pain in left shoulder: Secondary | ICD-10-CM | POA: Diagnosis not present

## 2021-10-06 DIAGNOSIS — G8929 Other chronic pain: Secondary | ICD-10-CM | POA: Diagnosis not present

## 2021-10-06 DIAGNOSIS — R29898 Other symptoms and signs involving the musculoskeletal system: Secondary | ICD-10-CM | POA: Diagnosis not present

## 2021-10-10 ENCOUNTER — Encounter (HOSPITAL_COMMUNITY)
Admission: RE | Admit: 2021-10-10 | Discharge: 2021-10-10 | Disposition: A | Discharge status: Home / Self Care | Payer: Medicare HMO | Source: Ambulatory Visit | Attending: "Endocrinology | Admitting: "Endocrinology

## 2021-10-10 ENCOUNTER — Other Ambulatory Visit: Payer: Self-pay

## 2021-10-10 DIAGNOSIS — E0590 Thyrotoxicosis, unspecified without thyrotoxic crisis or storm: Secondary | ICD-10-CM | POA: Insufficient documentation

## 2021-10-10 DIAGNOSIS — E0510 Thyrotoxicosis with toxic single thyroid nodule without thyrotoxic crisis or storm: Secondary | ICD-10-CM | POA: Insufficient documentation

## 2021-10-10 MED ORDER — SODIUM IODIDE I 131 CAPSULE
53.0000 | Freq: Once | INTRAVENOUS | Status: AC | PRN
  Administered 2021-10-10: 53 via ORAL

## 2021-10-16 DIAGNOSIS — R29898 Other symptoms and signs involving the musculoskeletal system: Secondary | ICD-10-CM | POA: Diagnosis not present

## 2021-10-16 DIAGNOSIS — M25512 Pain in left shoulder: Secondary | ICD-10-CM | POA: Diagnosis not present

## 2021-10-16 DIAGNOSIS — G8929 Other chronic pain: Secondary | ICD-10-CM | POA: Diagnosis not present

## 2021-10-16 DIAGNOSIS — M25612 Stiffness of left shoulder, not elsewhere classified: Secondary | ICD-10-CM | POA: Diagnosis not present

## 2021-10-19 ENCOUNTER — Encounter: Payer: Medicare HMO | Admitting: Family Medicine

## 2021-10-25 DIAGNOSIS — M25512 Pain in left shoulder: Secondary | ICD-10-CM | POA: Diagnosis not present

## 2021-10-25 DIAGNOSIS — M25612 Stiffness of left shoulder, not elsewhere classified: Secondary | ICD-10-CM | POA: Diagnosis not present

## 2021-10-25 DIAGNOSIS — R29898 Other symptoms and signs involving the musculoskeletal system: Secondary | ICD-10-CM | POA: Diagnosis not present

## 2021-10-25 DIAGNOSIS — G8929 Other chronic pain: Secondary | ICD-10-CM | POA: Diagnosis not present

## 2021-10-26 ENCOUNTER — Ambulatory Visit: Payer: Medicare HMO | Admitting: Orthopaedic Surgery

## 2021-10-27 DIAGNOSIS — R29898 Other symptoms and signs involving the musculoskeletal system: Secondary | ICD-10-CM | POA: Diagnosis not present

## 2021-10-27 DIAGNOSIS — G8929 Other chronic pain: Secondary | ICD-10-CM | POA: Diagnosis not present

## 2021-10-27 DIAGNOSIS — M25512 Pain in left shoulder: Secondary | ICD-10-CM | POA: Diagnosis not present

## 2021-10-27 DIAGNOSIS — M25612 Stiffness of left shoulder, not elsewhere classified: Secondary | ICD-10-CM | POA: Diagnosis not present

## 2021-10-30 ENCOUNTER — Other Ambulatory Visit: Payer: Self-pay | Admitting: Family Medicine

## 2021-10-31 DIAGNOSIS — R29898 Other symptoms and signs involving the musculoskeletal system: Secondary | ICD-10-CM | POA: Diagnosis not present

## 2021-10-31 DIAGNOSIS — M25512 Pain in left shoulder: Secondary | ICD-10-CM | POA: Diagnosis not present

## 2021-10-31 DIAGNOSIS — M25612 Stiffness of left shoulder, not elsewhere classified: Secondary | ICD-10-CM | POA: Diagnosis not present

## 2021-10-31 DIAGNOSIS — G8929 Other chronic pain: Secondary | ICD-10-CM | POA: Diagnosis not present

## 2021-11-02 ENCOUNTER — Other Ambulatory Visit: Payer: Self-pay

## 2021-11-02 ENCOUNTER — Ambulatory Visit: Payer: Medicare HMO | Admitting: Orthopaedic Surgery

## 2021-11-02 ENCOUNTER — Encounter: Payer: Self-pay | Admitting: Orthopaedic Surgery

## 2021-11-02 VITALS — BP 135/66 | HR 77

## 2021-11-02 DIAGNOSIS — M25512 Pain in left shoulder: Secondary | ICD-10-CM | POA: Diagnosis not present

## 2021-11-02 DIAGNOSIS — G8929 Other chronic pain: Secondary | ICD-10-CM | POA: Diagnosis not present

## 2021-11-02 NOTE — Progress Notes (Signed)
I got worse after PT.  She went to PT and afterwards had much increased pain in the left upper trapezius area.  She is slowly getting better.  I have told her to tell PT next visit.  I have reviewed the PT notes.  She has no other trauma.  ROM of the left shoulder is full but tender in the extremes and in the left upper trapezius.  NV intact.  ROM of neck is full.  Encounter Diagnosis  Name Primary?   Chronic left shoulder pain Yes   Continue PT with modifications.  Return in two weeks.  Continue present medicine.  Call if any problem.  Precautions discussed.  Electronically Signed Sanjuana Kava, MD 11/3/202210:13 AM

## 2021-11-03 DIAGNOSIS — M25612 Stiffness of left shoulder, not elsewhere classified: Secondary | ICD-10-CM | POA: Diagnosis not present

## 2021-11-03 DIAGNOSIS — G8929 Other chronic pain: Secondary | ICD-10-CM | POA: Diagnosis not present

## 2021-11-03 DIAGNOSIS — M25512 Pain in left shoulder: Secondary | ICD-10-CM | POA: Diagnosis not present

## 2021-11-03 DIAGNOSIS — R29898 Other symptoms and signs involving the musculoskeletal system: Secondary | ICD-10-CM | POA: Diagnosis not present

## 2021-11-07 ENCOUNTER — Other Ambulatory Visit: Payer: Self-pay

## 2021-11-07 ENCOUNTER — Encounter: Payer: Self-pay | Admitting: Family Medicine

## 2021-11-07 ENCOUNTER — Ambulatory Visit (INDEPENDENT_AMBULATORY_CARE_PROVIDER_SITE_OTHER): Payer: Medicare HMO | Admitting: Family Medicine

## 2021-11-07 VITALS — BP 129/64 | HR 69 | Resp 17 | Ht 62.0 in | Wt 106.0 lb

## 2021-11-07 DIAGNOSIS — Z23 Encounter for immunization: Secondary | ICD-10-CM

## 2021-11-07 DIAGNOSIS — Z0000 Encounter for general adult medical examination without abnormal findings: Secondary | ICD-10-CM | POA: Diagnosis not present

## 2021-11-07 DIAGNOSIS — R636 Underweight: Secondary | ICD-10-CM

## 2021-11-07 MED ORDER — ENSURE PO LIQD
237.0000 mL | Freq: Two times a day (BID) | ORAL | 12 refills | Status: DC
Start: 2021-11-07 — End: 2021-11-09

## 2021-11-07 NOTE — Assessment & Plan Note (Signed)

## 2021-11-07 NOTE — Assessment & Plan Note (Signed)
Trial of endiure 2 daily, currently being treated for overactive thyroid

## 2021-11-07 NOTE — Patient Instructions (Signed)
F/U in 4 months, call if you need me sooner  Flu vaccine today  Need current covid vaccine, get this in ext 1 week please  Nurse please order ensure , vanilla 2 daily, dx is underweight, wemay need to each out to commun ity services also  Thanks for choosing [facility], we consider it a privelige to serve you.

## 2021-11-07 NOTE — Progress Notes (Signed)
    Megan Gibbs     MRN: [id number]      DOB: July 25, 1951  HPI: Patient is in for annual physical exam. Currently being treated for symptomatic, overactive thyroid gland C/o weight loss requesting prescription Ensure Immunization is reviewed , and  updated if needed.   PE: BP 129/64   Pulse 69   Resp 17   Ht 5\' 2"  (1.575 m)   Wt 106 lb (48.1 kg)   SpO2 97%   BMI 19.39 kg/m   Pleasant  female, alert and oriented x 3, in no cardio-pulmonary distress. Afebrile. HEENT No facial trauma or asymetry. Sinuses non tender.  Extra occullar muscles intact.. External ears normal, . Neck: supple, no adenopathy,JVD or thyromegaly.No bruits.  Chest: Clear to ascultation bilaterally.No crackles or wheezes. Non tender to palpation  Cardiovascular system; Heart sounds normal,  S1 and  S2 ,no S3.  No murmur, or thrill. Apical beat not displaced Peripheral pulses normal.  Abdomen: Soft, non tender, no organomegaly or masses. No bruits. Bowel sounds normal. No guarding, tenderness or rebound.     Musculoskeletal exam: Full ROM of spine, hips , shoulders and knees. No deformity ,swelling or crepitus noted. No muscle wasting or atrophy.   Neurologic: Cranial nerves 2 to 12 intact. Power, tone ,sensation and reflexes normal throughout. No disturbance in gait. No tremor.  Skin: Intact, no ulceration, erythema , scaling or rash noted. Pigmentation normal throughout  Psych; Normal mood and affect. Judgement and concentration normal   Assessment & Plan:  Annual physical exam Annual exam as documented. Counseling done  re healthy lifestyle involving commitment to 150 minutes exercise per week, heart healthy diet, and attaining healthy weight.The importance of adequate sleep also discussed. Regular seat belt use and home safety, is also discussed. Changes in health habits are decided on by the patient with goals and time frames  set for achieving them. Immunization and cancer  screening needs are specifically addressed at this visit.   Underweight due to inadequate caloric intake Trial of endiure 2 daily, currently being treated for overactive thyroid

## 2021-11-08 DIAGNOSIS — G8929 Other chronic pain: Secondary | ICD-10-CM | POA: Diagnosis not present

## 2021-11-08 DIAGNOSIS — M25512 Pain in left shoulder: Secondary | ICD-10-CM | POA: Diagnosis not present

## 2021-11-08 DIAGNOSIS — M25612 Stiffness of left shoulder, not elsewhere classified: Secondary | ICD-10-CM | POA: Diagnosis not present

## 2021-11-08 DIAGNOSIS — R29898 Other symptoms and signs involving the musculoskeletal system: Secondary | ICD-10-CM | POA: Diagnosis not present

## 2021-11-09 ENCOUNTER — Other Ambulatory Visit: Payer: Self-pay

## 2021-11-09 ENCOUNTER — Telehealth: Payer: Self-pay

## 2021-11-09 MED ORDER — ENSURE PO LIQD: 237.0000 mL | Freq: Two times a day (BID) | ORAL | 12 refills | Status: DC

## 2021-11-09 NOTE — Telephone Encounter (Signed)
Patient called was just seen in office and said [facility] does not carry the ensure, can it be sent into [facility] in [place] and would like for [employer] deliver to home.

## 2021-11-09 NOTE — Telephone Encounter (Signed)
Sent to [facility] per patient and asked to deliver

## 2021-11-10 DIAGNOSIS — G8929 Other chronic pain: Secondary | ICD-10-CM | POA: Diagnosis not present

## 2021-11-10 DIAGNOSIS — M25612 Stiffness of left shoulder, not elsewhere classified: Secondary | ICD-10-CM | POA: Diagnosis not present

## 2021-11-10 DIAGNOSIS — M25512 Pain in left shoulder: Secondary | ICD-10-CM | POA: Diagnosis not present

## 2021-11-10 DIAGNOSIS — R29898 Other symptoms and signs involving the musculoskeletal system: Secondary | ICD-10-CM | POA: Diagnosis not present

## 2021-11-15 DIAGNOSIS — R29898 Other symptoms and signs involving the musculoskeletal system: Secondary | ICD-10-CM | POA: Diagnosis not present

## 2021-11-15 DIAGNOSIS — G8929 Other chronic pain: Secondary | ICD-10-CM | POA: Diagnosis not present

## 2021-11-15 DIAGNOSIS — M25512 Pain in left shoulder: Secondary | ICD-10-CM | POA: Diagnosis not present

## 2021-11-15 DIAGNOSIS — M25612 Stiffness of left shoulder, not elsewhere classified: Secondary | ICD-10-CM | POA: Diagnosis not present

## 2021-11-16 ENCOUNTER — Other Ambulatory Visit: Payer: Self-pay

## 2021-11-16 ENCOUNTER — Encounter: Payer: Self-pay | Admitting: Orthopaedic Surgery

## 2021-11-16 ENCOUNTER — Ambulatory Visit: Payer: Medicare HMO | Admitting: Orthopaedic Surgery

## 2021-11-16 VITALS — Ht 67.0 in | Wt 107.0 lb

## 2021-11-16 DIAGNOSIS — M25512 Pain in left shoulder: Secondary | ICD-10-CM

## 2021-11-16 DIAGNOSIS — G8929 Other chronic pain: Secondary | ICD-10-CM | POA: Diagnosis not present

## 2021-11-16 NOTE — Progress Notes (Signed)
My shoulder is better.  She is going to PT and is improving.  I have reviewed the notes.  She has less pain and better motion.  She has some pain but not as much.  She has no new trauma.  ROM of the left shoulder is full with pain in the extremes.  NV intact.  Encounter Diagnosis  Name Primary?   Chronic left shoulder pain Yes   Finish up PT.  Continue exercises at home.  I will see her in one month.  Call if any problem.  Precautions discussed.  Electronically Signed Sanjuana Kava, MD 11/17/202210:22 AM

## 2021-11-17 DIAGNOSIS — M25612 Stiffness of left shoulder, not elsewhere classified: Secondary | ICD-10-CM | POA: Diagnosis not present

## 2021-11-17 DIAGNOSIS — M25512 Pain in left shoulder: Secondary | ICD-10-CM | POA: Diagnosis not present

## 2021-11-17 DIAGNOSIS — R29898 Other symptoms and signs involving the musculoskeletal system: Secondary | ICD-10-CM | POA: Diagnosis not present

## 2021-11-17 DIAGNOSIS — G8929 Other chronic pain: Secondary | ICD-10-CM | POA: Diagnosis not present

## 2021-11-20 DIAGNOSIS — E0590 Thyrotoxicosis, unspecified without thyrotoxic crisis or storm: Secondary | ICD-10-CM | POA: Diagnosis not present

## 2021-11-21 LAB — T4, FREE: Free T4: 0.73 ng/dL — ABNORMAL LOW (ref 0.82–1.77)

## 2021-11-21 LAB — TSH: TSH: 0.067 u[IU]/mL — ABNORMAL LOW (ref 0.450–4.500)

## 2021-11-21 LAB — T3, FREE: T3, Free: 2.2 pg/mL (ref 2.0–4.4)

## 2021-11-27 ENCOUNTER — Ambulatory Visit (INDEPENDENT_AMBULATORY_CARE_PROVIDER_SITE_OTHER): Payer: Medicare HMO | Admitting: "Endocrinology

## 2021-11-27 ENCOUNTER — Encounter: Payer: Self-pay | Admitting: "Endocrinology

## 2021-11-27 VITALS — BP 96/58 | HR 72 | Ht 67.0 in | Wt 109.2 lb

## 2021-11-27 DIAGNOSIS — M810 Age-related osteoporosis without current pathological fracture: Secondary | ICD-10-CM | POA: Diagnosis not present

## 2021-11-27 DIAGNOSIS — E890 Postprocedural hypothyroidism: Secondary | ICD-10-CM

## 2021-11-27 MED ORDER — LEVOTHYROXINE SODIUM 25 MCG PO TABS: 25.0000 ug | ORAL_TABLET | Freq: Every day | ORAL | 2 refills | Status: DC

## 2021-11-27 NOTE — Progress Notes (Signed)
11/27/2021     Endocrinology follow-up note   Subjective:    Patient ID: Megan Gibbs, female    DOB: 01-23-1951,    Past Medical History:  Diagnosis Date   Allergic rhinitis    Anemia    Anxiety    Back pain    Depression    Hypokalemia    Osteoporosis    Prediabetes    Seasonal allergies    Vertigo    Wears glasses    Past Surgical History:  Procedure Laterality Date   BACK SURGERY  1992 & 2009   Dr. Joya Salm    BREAST BIOPSY     BREAST EXCISIONAL BIOPSY     BREAST LUMPECTOMY WITH RADIOACTIVE SEED LOCALIZATION Right 12/01/2014   Procedure: RIGHT BREAST LUMPECTOMY WITH RADIOACTIVE SEED LOCALIZATION;  Surgeon: Fanny Skates, MD;  Location: [place];  Service: General;  Laterality: Right;   COLONOSCOPY N/A 04/19/2014   Procedure: COLONOSCOPY;  Surgeon: Daneil Dolin, MD;  [facility];  Service: Endoscopy;  Laterality: N/A;  9:30 AM   DILATION AND CURETTAGE OF UTERUS     TUBAL LIGATION     1976   Social History   Socioeconomic History   Marital status: Divorced    Spouse name: Not on file   Number of children: 3   Years of education: Not on file   Highest education level: Not on file  Occupational History   Occupation: disabled     Employer: UNEMPLOYED  Tobacco Use   Smoking status: Never   Smokeless tobacco: Never  Substance and Sexual Activity   Alcohol use: No   Drug use: No   Sexual activity: Yes    Birth control/protection: Post-menopausal  Other Topics Concern   Not on file  Social History Narrative   DOING A LOT OF [place].    Social Determinants of Health   Financial Resource Strain: Low Risk    Difficulty of Paying Living Expenses: Not hard at all  Food Insecurity: No Food Insecurity   Worried About Charity fundraiser in the Last Year: Never true   [place] in the Last Year: Never true  Transportation Needs: No Transportation Needs   Lack of Transportation (Medical): No   Lack of  Transportation (Non-Medical): No  Physical Activity: Inactive   Days of Exercise per Week: 0 days   Minutes of Exercise per Session: 0 min  Stress: No Stress Concern Present   Feeling of Stress : Not at all  Social Connections: Moderately Isolated   Frequency of Communication with Friends and Family: More than three times a week   Frequency of Social Gatherings with Friends and Family: More than three times a week   Attends Religious Services: More than 4 times per year   Active Member of [employer] or Organizations: No   Attends Archivist Meetings: Never   Marital Status: Divorced   Outpatient Encounter Medications as of 11/27/2021  Medication Sig   levothyroxine (SYNTHROID) 25 MCG tablet Take 1 tablet (25 mcg total) by mouth daily before breakfast.   alendronate (FOSAMAX) 70 MG tablet TAKE 1 TABLET ONCE A WEEK 30 MINUTES BEFORE BREAKFAST WITH 8 OUNCES OF WATER FOR OSTEOPOROSIS.   Ensure (ENSURE) Take 237 mLs by mouth 2 (two) times daily between meals. VANILLA   fexofenadine (ALLEGRA ALLERGY) 180 MG tablet Take 1 tablet (180 mg total) by mouth daily. (Patient taking differently: Take 180 mg by mouth daily as  needed.)   fluticasone (FLONASE) 50 MCG/ACT nasal spray Place 2 sprays into both nostrils daily. (Patient taking differently: Place 2 sprays into both nostrils daily as needed.)   Multiple Vitamins-Minerals (AIRBORNE PO) Take 1 tablet by mouth daily.   Multiple Vitamins-Minerals (MULTIVITAMIN ADULTS 50+ PO) Take 1 tablet by mouth daily.   naproxen (NAPROSYN) 500 MG tablet Take 1 tablet (500 mg total) by mouth 2 (two) times daily with a meal.   omega-3 fish oil (MAXEPA) 1000 MG CAPS capsule Take 2 capsules by mouth daily.    No facility-administered encounter medications on file as of 11/27/2021.   ALLERGIES: No Known Allergies VACCINATION STATUS: Immunization History  Administered Date(s) Administered   Fluad Quad(high Dose 65+) 09/14/2019, 10/24/2020, 11/07/2021    Influenza Split 09/24/2012   Influenza Whole 10/04/2009, 09/29/2010, 09/17/2011   Influenza,inj,Quad PF,6+ Mos 10/29/2013, 10/11/2014, 09/20/2015, 08/20/2016, 09/17/2017, 09/18/2018   Moderna SARS-COV2 Booster Vaccination 07/04/2021   Moderna Sars-Covid-2 Vaccination 03/09/2020, 04/10/2020, 11/10/2020   Pneumococcal Conjugate-13 03/17/2015   Pneumococcal Polysaccharide-23 12/18/2016   Td 03/24/2010   Zoster, Live 09/20/2011    HPI  70 yr old female with medical hx as above.  She is following in this clinic for management of osteoporosis.  She is on Fosamax 70 mg p.o. weekly since 2015.  She has no complaints regarding her treatment for osteoporosis.  She underwent previsit DEXA scan which generally shows significant improvement from 2011-2021, in all areas scanned : spine, hips, and femuri . She has had lumbar disc prolapse for which she underwent surgery in 2002 and 2009.  Recently she was seen due to abnormal thyroid function tests.  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  She was given RAI thyroid ablation which helped her symptoms resolve and patient presents with significant clinical improvement.  She is happy about gaining some of her weight back.  Her previsit thyroid function tests are consistent with treatment effect with mild subclinical hypothyroidism. -See notes from her previous visit.     She denies dysphagia, shortness of breath, nor voice change. She  has 3 grown children. she denies parathyroid, thyroid dysfunction.  She denies history of fragility fracture.  She remains on vitamin D and low-dose calcium supplement.  Review of Systems Limited as above.  Objective:    BP (!) 96/58   Pulse 72   Ht 5\' 7"  (1.702 m)   Wt 109 lb 3.2 oz (49.5 kg)   BMI 17.10 kg/m   Wt Readings from Last 3 Encounters:  11/27/21 109 lb 3.2 oz (49.5 kg)  11/16/21 107 lb (48.5 kg)  11/07/21 106 lb (48.1 kg)     Physical Exam     Results for orders placed or performed in visit on 09/21/21  TSH  Result Value Ref Range   TSH 0.067 (L) 0.450 - 4.500 uIU/mL  T4, free  Result Value Ref Range   Free T4 0.73 (L) 0.82 - 1.77 ng/dL  T3, free  Result Value Ref Range   T3, Free 2.2 2.0 - 4.4 pg/mL   Complete Blood Count (Most recent): Lab Results  Component Value Date   WBC 4.5 07/25/2021   HGB 12.6 07/25/2021   HCT 38.9 07/25/2021   MCV 79 07/25/2021   PLT 213 07/25/2021   Chemistry (most recent): Lab Results  Component Value Date   NA 142 07/25/2021   K 5.3 (H) 07/25/2021   CL 103 07/25/2021   CO2 20 07/25/2021   BUN 10 07/25/2021  CREATININE 0.68 07/25/2021   Diabetic Labs (most recent): Lab Results  Component Value Date   HGBA1C 5.5 12/11/2016   HGBA1C 5.6 12/12/2015   HGBA1C 5.6 06/21/2015   Lipid profile (most recent): Lab Results  Component Value Date   TRIG 92 07/25/2021   CHOL 170 07/25/2021     Assessment & Plan:  1.  RAI induced hypothyroidism She is status post RAI thyroid ablation for hyper functioning nodule in the left lobe of her thyroid. Her thyroid function tests are consistent with treatment effect with mild clinical hypothyroidism. -She will benefit from early initiation of low-dose thyroid hormone replacement.  I discussed and initiated levothyroxine 25 mcg p.o. daily before breakfast.   - We discussed about the correct intake of her thyroid hormone, on empty stomach at fasting, with water, separated by at least 30 minutes from breakfast and other medications,  and separated by more than 4 hours from calcium, iron, multivitamins, acid reflux medications (PPIs). -Patient is made aware of the fact that thyroid hormone replacement is needed for life, dose to be adjusted by periodic monitoring of thyroid function tests.  She will return in 9 weeks with repeat thyroid function tests.   2. Idiopathic osteoporosis  - Hers is settled , likely  postmenopausal osteoporosis.  Previsit DEXA scan from May 17, 2020 showed continued progressive improvement compared to her last DEXA scan readings from 2011-2021 in spine, hips, and femuri. She is tolerating Fosamax, advised to continue Fosamax 70 mg weekly.   She is advised on side effects and precautions. She was started on Fosamax in 2015. -She has no recent fragility fracture.    This is  a good development for her since she has not lost significant bone density since she was started on Fosamax in 2015.  Her PTH is normal along with calcium and TFTs, ruling out secondary cause of osteoporosis. She is made aware of the possibility of treating her with Fosamax for 5 to 8 years before considering drug holiday.   Risk of untreated osteoporosis is discussed with her in detail.  Her next bone density is due in May 2023.    3.  Vitamin D deficiency -She is now vitamin D replete at 64.3.  She is status post vitamin D2 50,000 units weekly.  She will continue to maintain with vitamin D3 600/200 mg p.o. daily.  I advised patient to maintain close follow up with their PCP for primary care needs.   I spent 21 minutes in the care of the patient today including review of labs from Thyroid Function, CMP, and other relevant labs ; imaging/biopsy records (current and previous including abstractions from other facilities); face-to-face time discussing  her lab results and symptoms, medications doses, her options of short and long term treatment based on the latest standards of care / guidelines;   and documenting the encounter.  Megan Gibbs  participated in the discussions, expressed understanding, and voiced agreement with the above plans.  All questions were answered to her satisfaction. she is encouraged to contact clinic should she have any questions or concerns prior to her return visit.    Follow up plan: Return in about 9 weeks (around 01/29/2022) for F/U with Pre-visit Labs.  Glade Lloyd, MD Phone:  [phone]  Fax: [phone]  -  This note was partially dictated with voice recognition software. Similar sounding words can be transcribed inadequately or may not  be corrected upon review.  11/27/2021, 12:19 PM

## 2021-12-14 ENCOUNTER — Other Ambulatory Visit: Payer: Self-pay

## 2021-12-14 ENCOUNTER — Ambulatory Visit: Payer: Medicare HMO | Admitting: Orthopaedic Surgery

## 2021-12-14 ENCOUNTER — Encounter: Payer: Self-pay | Admitting: Orthopaedic Surgery

## 2021-12-14 VITALS — BP 130/75 | HR 91 | Ht 62.0 in | Wt 110.6 lb

## 2021-12-14 DIAGNOSIS — G8929 Other chronic pain: Secondary | ICD-10-CM | POA: Diagnosis not present

## 2021-12-14 DIAGNOSIS — M25512 Pain in left shoulder: Secondary | ICD-10-CM | POA: Diagnosis not present

## 2021-12-14 NOTE — Progress Notes (Signed)
I am doing great  She has no pain of the shoulder today and has full ROM.  She has been to PT and has done well.  She is pleased.  I have reviewed the PT notes.  Encounter Diagnosis  Name Primary?   Chronic left shoulder pain Yes   I will see as needed.  Call if any problem.  Precautions discussed.  Electronically Signed Sanjuana Kava, MD 12/15/20229:33 AM

## 2021-12-26 ENCOUNTER — Other Ambulatory Visit: Payer: Self-pay | Admitting: Family Medicine

## 2021-12-28 ENCOUNTER — Ambulatory Visit (HOSPITAL_COMMUNITY)
Admission: RE | Admit: 2021-12-28 | Discharge: 2021-12-28 | Disposition: A | Discharge status: Home / Self Care | Payer: Medicare HMO | Source: Ambulatory Visit | Attending: Family Medicine | Admitting: Family Medicine

## 2021-12-28 ENCOUNTER — Other Ambulatory Visit: Payer: Self-pay

## 2021-12-28 ENCOUNTER — Ambulatory Visit (HOSPITAL_COMMUNITY)
Admission: RE | Admit: 2021-12-28 | Discharge: 2021-12-28 | Disposition: A | Discharge status: Home / Self Care | Payer: Medicare HMO | Source: Ambulatory Visit | Attending: "Endocrinology | Admitting: "Endocrinology

## 2021-12-28 DIAGNOSIS — M810 Age-related osteoporosis without current pathological fracture: Secondary | ICD-10-CM | POA: Insufficient documentation

## 2021-12-28 DIAGNOSIS — Z780 Asymptomatic menopausal state: Secondary | ICD-10-CM | POA: Diagnosis not present

## 2021-12-28 DIAGNOSIS — Z1231 Encounter for screening mammogram for malignant neoplasm of breast: Secondary | ICD-10-CM | POA: Diagnosis not present

## 2021-12-28 DIAGNOSIS — Z13820 Encounter for screening for osteoporosis: Secondary | ICD-10-CM | POA: Insufficient documentation

## 2021-12-28 DIAGNOSIS — M8588 Other specified disorders of bone density and structure, other site: Secondary | ICD-10-CM | POA: Diagnosis not present

## 2022-01-18 DIAGNOSIS — H524 Presbyopia: Secondary | ICD-10-CM | POA: Diagnosis not present

## 2022-01-18 DIAGNOSIS — H25813 Combined forms of age-related cataract, bilateral: Secondary | ICD-10-CM | POA: Diagnosis not present

## 2022-01-22 DIAGNOSIS — E890 Postprocedural hypothyroidism: Secondary | ICD-10-CM | POA: Diagnosis not present

## 2022-01-23 ENCOUNTER — Other Ambulatory Visit: Payer: Self-pay | Admitting: Family Medicine

## 2022-01-23 LAB — T4, FREE: Free T4: 0.78 ng/dL — ABNORMAL LOW (ref 0.82–1.77)

## 2022-01-23 LAB — TSH: TSH: 3.6 u[IU]/mL (ref 0.450–4.500)

## 2022-01-29 ENCOUNTER — Ambulatory Visit (INDEPENDENT_AMBULATORY_CARE_PROVIDER_SITE_OTHER): Payer: No Typology Code available for payment source | Admitting: "Endocrinology

## 2022-01-29 ENCOUNTER — Other Ambulatory Visit: Payer: Self-pay

## 2022-01-29 ENCOUNTER — Encounter: Payer: Self-pay | Admitting: "Endocrinology

## 2022-01-29 VITALS — BP 116/76 | HR 72 | Ht 62.0 in | Wt 112.6 lb

## 2022-01-29 DIAGNOSIS — M810 Age-related osteoporosis without current pathological fracture: Secondary | ICD-10-CM | POA: Diagnosis not present

## 2022-01-29 DIAGNOSIS — E890 Postprocedural hypothyroidism: Secondary | ICD-10-CM | POA: Diagnosis not present

## 2022-01-29 MED ORDER — LEVOTHYROXINE SODIUM 50 MCG PO TABS
50.0000 ug | ORAL_TABLET | Freq: Every day | ORAL | 1 refills | Status: DC
Start: 2022-01-29 — End: 2022-07-30

## 2022-01-29 NOTE — Progress Notes (Signed)
01/29/2022     Endocrinology follow-up note   Subjective:    Patient ID: Megan Gibbs, female    DOB: Apr 21, 1951,    Past Medical History:  Diagnosis Date   Allergic rhinitis    Anemia    Anxiety    Back pain    Depression    Hypokalemia    Osteoporosis    Prediabetes    Seasonal allergies    Vertigo    Wears glasses    Past Surgical History:  Procedure Laterality Date   BACK SURGERY  1992 & 2009   Dr. Joya Salm    BREAST BIOPSY     BREAST EXCISIONAL BIOPSY     BREAST LUMPECTOMY WITH RADIOACTIVE SEED LOCALIZATION Right 12/01/2014   Procedure: RIGHT BREAST LUMPECTOMY WITH RADIOACTIVE SEED LOCALIZATION;  Surgeon: Fanny Skates, MD;  Location: [place];  Service: General;  Laterality: Right;   COLONOSCOPY N/A 04/19/2014   Procedure: COLONOSCOPY;  Surgeon: Daneil Dolin, MD;  [facility];  Service: Endoscopy;  Laterality: N/A;  9:30 AM   DILATION AND CURETTAGE OF UTERUS     TUBAL LIGATION     1976   Social History   Socioeconomic History   Marital status: Divorced    Spouse name: Not on file   Number of children: 3   Years of education: Not on file   Highest education level: Not on file  Occupational History   Occupation: disabled     Employer: UNEMPLOYED  Tobacco Use   Smoking status: Never   Smokeless tobacco: Never  Substance and Sexual Activity   Alcohol use: No   Drug use: No   Sexual activity: Yes    Birth control/protection: Post-menopausal  Other Topics Concern   Not on file  Social History Narrative   DOING A LOT OF [place].    Social Determinants of Health   Financial Resource Strain: Low Risk    Difficulty of Paying Living Expenses: Not hard at all  Food Insecurity: No Food Insecurity   Worried About Charity fundraiser in the Last Year: Never true   [place] in the Last Year: Never true  Transportation Needs: No Transportation Needs   Lack of Transportation (Medical): No   Lack of  Transportation (Non-Medical): No  Physical Activity: Inactive   Days of Exercise per Week: 0 days   Minutes of Exercise per Session: 0 min  Stress: No Stress Concern Present   Feeling of Stress : Not at all  Social Connections: Moderately Isolated   Frequency of Communication with Friends and Family: More than three times a week   Frequency of Social Gatherings with Friends and Family: More than three times a week   Attends Religious Services: More than 4 times per year   Active Member of [employer] or Organizations: No   Attends Archivist Meetings: Never   Marital Status: Divorced   Outpatient Encounter Medications as of 01/29/2022  Medication Sig   alendronate (FOSAMAX) 70 MG tablet TAKE 1 TABLET ONCE A WEEK 30 MINUTES BEFORE BREAKFAST WITH 8 OUNCES OF WATER FOR OSTEOPOROSIS.   Ensure (ENSURE) Take 237 mLs by mouth 2 (two) times daily between meals. VANILLA   fexofenadine (ALLEGRA ALLERGY) 180 MG tablet Take 1 tablet (180 mg total) by mouth daily. (Patient taking differently: Take 180 mg by mouth daily as needed.)   fluticasone (FLONASE) 50 MCG/ACT nasal spray Place 2 sprays into both nostrils daily. (Patient taking  differently: Place 2 sprays into both nostrils daily as needed.)   levothyroxine (SYNTHROID) 50 MCG tablet Take 1 tablet (50 mcg total) by mouth daily before breakfast.   Multiple Vitamins-Minerals (AIRBORNE PO) Take 1 tablet by mouth daily.   Multiple Vitamins-Minerals (MULTIVITAMIN ADULTS 50+ PO) Take 1 tablet by mouth daily.   naproxen (NAPROSYN) 500 MG tablet Take 1 tablet (500 mg total) by mouth 2 (two) times daily with a meal.   omega-3 fish oil (MAXEPA) 1000 MG CAPS capsule Take 2 capsules by mouth daily.    [DISCONTINUED] levothyroxine (SYNTHROID) 25 MCG tablet Take 1 tablet (25 mcg total) by mouth daily before breakfast.   No facility-administered encounter medications on file as of 01/29/2022.   ALLERGIES: No Known Allergies VACCINATION STATUS: Immunization  History  Administered Date(s) Administered   Fluad Quad(high Dose 65+) 09/14/2019, 10/24/2020, 11/07/2021   Influenza Split 09/24/2012   Influenza Whole 10/04/2009, 09/29/2010, 09/17/2011   Influenza,inj,Quad PF,6+ Mos 10/29/2013, 10/11/2014, 09/20/2015, 08/20/2016, 09/17/2017, 09/18/2018   Moderna SARS-COV2 Booster Vaccination 07/04/2021   Moderna Sars-Covid-2 Vaccination 03/09/2020, 04/10/2020, 11/10/2020   Pneumococcal Conjugate-13 03/17/2015   Pneumococcal Polysaccharide-23 12/18/2016   Td 03/24/2010   Zoster, Live 09/20/2011    HPI  71 yr old female with medical hx as above.  She is following in this clinic for management of osteoporosis.  She is on Fosamax 70 mg p.o. weekly since 2015.  She has no complaints regarding her treatment for osteoporosis.  She underwent previsit DEXA scan which generally shows significant improvement from 2011-2021, in all areas scanned : spine, hips, and femuri . She has had lumbar disc prolapse for which she underwent surgery in 2002 and 2009.  Recently she was seen due to abnormal thyroid function tests.  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  She was given RAI thyroid ablation which helped her symptoms resolve and patient presents with significant clinical improvement.  She was given levothyroxine 25 mcg p.o. daily for RAI induced hypothyroidism.  She returns with continued clinical improvement  .   See notes from previous visits.  She has gained 3 pounds since last visit which she is happy about.     She denies dysphagia, shortness of breath, nor voice change. She  has 3 grown children. she denies parathyroid, thyroid dysfunction.  She denies history of fragility fracture.  She remains on vitamin D and low-dose calcium supplement.  Review of Systems Limited as above.  Objective:    BP 116/76    Pulse 72    Ht 5\' 2"  (1.575 m)    Wt 112 lb 9.6 oz (51.1 kg)    BMI 20.59  kg/m   Wt Readings from Last 3 Encounters:  01/29/22 112 lb 9.6 oz (51.1 kg)  12/14/21 110 lb 9.6 oz (50.2 kg)  11/27/21 109 lb 3.2 oz (49.5 kg)    Physical Exam     Results for orders placed or performed in visit on 11/27/21  TSH  Result Value Ref Range   TSH 3.600 0.450 - 4.500 uIU/mL  T4, free  Result Value Ref Range   Free T4 0.78 (L) 0.82 - 1.77 ng/dL   Complete Blood Count (Most recent): Lab Results  Component Value Date   WBC 4.5 07/25/2021   HGB 12.6 07/25/2021   HCT 38.9 07/25/2021   MCV 79 07/25/2021   PLT 213 07/25/2021   Chemistry (most recent): Lab Results  Component Value Date   NA 142 07/25/2021  K 5.3 (H) 07/25/2021   CL 103 07/25/2021   CO2 20 07/25/2021   BUN 10 07/25/2021   CREATININE 0.68 07/25/2021   Diabetic Labs (most recent): Lab Results  Component Value Date   HGBA1C 5.5 12/11/2016   HGBA1C 5.6 12/12/2015   HGBA1C 5.6 06/21/2015   Lipid profile (most recent): Lab Results  Component Value Date   TRIG 92 07/25/2021   CHOL 170 07/25/2021     Assessment & Plan:  1.  RAI induced hypothyroidism She is status post RAI thyroid ablation for hyper functioning nodule in the left lobe of her thyroid. -Her thyroid function tests are consistent with under replacement.  I discussed and increase her levothyroxine to 50 mcg p.o. daily before breakfast.   - We discussed about the correct intake of her thyroid hormone, on empty stomach at fasting, with water, separated by at least 30 minutes from breakfast and other medications,  and separated by more than 4 hours from calcium, iron, multivitamins, acid reflux medications (PPIs). -Patient is made aware of the fact that thyroid hormone replacement is needed for life, dose to be adjusted by periodic monitoring of thyroid function tests.   She will return in 9 weeks with repeat thyroid function tests.   2. 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  Previsit  DEXA scan from May 17, 2020 showed continued progressive improvement compared to her last DEXA scan readings from 2011-2021 in spine, hips, and femuri. She is tolerating Fosamax, advised to continue Fosamax 70 mg weekly.   She is advised on side effects and precautions. She was started on Fosamax in 2015. -She has no recent fragility fracture.    This is  a good development for her since she has not lost significant bone density since she was started on Fosamax in 2015.  Her PTH is normal along with calcium and TFTs, ruling out secondary cause of osteoporosis. She is made aware of the possibility of treating her with Fosamax for 5 to 8 years before considering drug holiday.   Risk of untreated osteoporosis is discussed with her in detail.  Her next bone density is due in May 2023.   3.  Vitamin D deficiency -She is now vitamin D replete at 64.3.  She is status post vitamin D2 50,000 units weekly.  She will continue to maintain with vitamin D3 600/200 mg p.o. daily.  I advised patient to maintain close follow up with their PCP for primary care needs.   I spent 22 minutes in the care of the patient today including review of labs from Thyroid Function, CMP, and other relevant labs ; imaging/biopsy records (current and previous including abstractions from other facilities); face-to-face time discussing  her lab results and symptoms, medications doses, her options of short and long term treatment based on the latest standards of care / guidelines;   and documenting the encounter.  Megan Gibbs  participated in the discussions, expressed understanding, and voiced agreement with the above plans.  All questions were answered to her satisfaction. she is encouraged to contact clinic should she have any questions or concerns prior to her return visit.     Follow up plan: Return in about 6 months (around 07/29/2022) for F/U with Pre-visit Labs.  Glade Lloyd, MD Phone: [phone]  Fax: [phone]  -   This note was partially dictated with voice recognition software. Similar sounding words can be transcribed inadequately or may not  be corrected upon review.  01/29/2022,  12:35 PM

## 2022-02-26 ENCOUNTER — Other Ambulatory Visit: Payer: Self-pay | Admitting: "Endocrinology

## 2022-02-26 ENCOUNTER — Other Ambulatory Visit: Payer: Self-pay | Admitting: Family Medicine

## 2022-03-07 ENCOUNTER — Encounter: Payer: Self-pay | Admitting: Family Medicine

## 2022-03-07 ENCOUNTER — Encounter (INDEPENDENT_AMBULATORY_CARE_PROVIDER_SITE_OTHER): Payer: Self-pay

## 2022-03-07 ENCOUNTER — Ambulatory Visit (INDEPENDENT_AMBULATORY_CARE_PROVIDER_SITE_OTHER): Payer: No Typology Code available for payment source | Admitting: Family Medicine

## 2022-03-07 ENCOUNTER — Other Ambulatory Visit: Payer: Self-pay

## 2022-03-07 VITALS — BP 123/77 | HR 68 | Ht 62.0 in | Wt 109.0 lb

## 2022-03-07 DIAGNOSIS — E0590 Thyrotoxicosis, unspecified without thyrotoxic crisis or storm: Secondary | ICD-10-CM | POA: Diagnosis not present

## 2022-03-07 DIAGNOSIS — E559 Vitamin D deficiency, unspecified: Secondary | ICD-10-CM | POA: Diagnosis not present

## 2022-03-07 DIAGNOSIS — R630 Anorexia: Secondary | ICD-10-CM

## 2022-03-07 DIAGNOSIS — E785 Hyperlipidemia, unspecified: Secondary | ICD-10-CM | POA: Diagnosis not present

## 2022-03-07 DIAGNOSIS — E039 Hypothyroidism, unspecified: Secondary | ICD-10-CM | POA: Diagnosis not present

## 2022-03-07 MED ORDER — OYSTER SHELL CALCIUM/D3 500-5 MG-MCG PO TABS: 1.0000 | ORAL_TABLET | Freq: Two times a day (BID) | ORAL | 11 refills | Status: DC

## 2022-03-07 NOTE — Patient Instructions (Addendum)
F/U in early September, flu vaccine atr visit, call if you need me sooner ? ?NEED shingrix vaccines and also TDAP, get that later ? ?Labs today, CBC,  cmp and EGR, vit D and lipid panel. (Needs water to drink and states needs butterfly) ? ?Thyroid function is great based on recent lab ? ?Commit to eating a bit more and a bit more often ? ?Keep warm by layering up with clothing ? ?Thanks for choosing [facility], we consider it a privelige to serve you. ? ? ?Bone density shows that bones are thinning, you need daily calcium with Vit D to build bones, I have sent to your pharmacy ?

## 2022-03-08 LAB — CMP14+EGFR
ALT: 20 IU/L (ref 0–32)
AST: 25 IU/L (ref 0–40)
Albumin/Globulin Ratio: 1.9 (ref 1.2–2.2)
Albumin: 5 g/dL — ABNORMAL HIGH (ref 3.8–4.8)
Alkaline Phosphatase: 93 IU/L (ref 44–121)
BUN/Creatinine Ratio: 14 (ref 12–28)
BUN: 11 mg/dL (ref 8–27)
Bilirubin Total: 0.4 mg/dL (ref 0.0–1.2)
CO2: 22 mmol/L (ref 20–29)
Calcium: 9.8 mg/dL (ref 8.7–10.3)
Chloride: 107 mmol/L — ABNORMAL HIGH (ref 96–106)
Creatinine, Ser: 0.8 mg/dL (ref 0.57–1.00)
Globulin, Total: 2.6 g/dL (ref 1.5–4.5)
Glucose: 85 mg/dL (ref 70–99)
Potassium: 4.8 mmol/L (ref 3.5–5.2)
Sodium: 145 mmol/L — ABNORMAL HIGH (ref 134–144)
Total Protein: 7.6 g/dL (ref 6.0–8.5)
eGFR: 79 mL/min/{1.73_m2} (ref >59)

## 2022-03-08 LAB — LIPID PANEL
Chol/HDL Ratio: 3 ratio (ref 0.0–4.4)
Cholesterol, Total: 203 mg/dL — ABNORMAL HIGH (ref 100–199)
HDL: 67 mg/dL (ref >39)
LDL Chol Calc (NIH): 120 mg/dL — ABNORMAL HIGH (ref 0–99)
Triglycerides: 88 mg/dL (ref 0–149)
VLDL Cholesterol Cal: 16 mg/dL (ref 5–40)

## 2022-03-08 LAB — CBC
Hematocrit: 39.3 % (ref 34.0–46.6)
Hemoglobin: 12.6 g/dL (ref 11.1–15.9)
MCH: 26.3 pg — ABNORMAL LOW (ref 26.6–33.0)
MCHC: 32.1 g/dL (ref 31.5–35.7)
MCV: 82 fL (ref 79–97)
Platelets: 240 10*3/uL (ref 150–450)
RBC: 4.79 x10E6/uL (ref 3.77–5.28)
RDW: 13.5 % (ref 11.7–15.4)
WBC: 4.6 10*3/uL (ref 3.4–10.8)

## 2022-03-08 LAB — VITAMIN D 25 HYDROXY (VIT D DEFICIENCY, FRACTURES): Vit D, 25-Hydroxy: 50.4 ng/mL (ref 30.0–100.0)

## 2022-03-26 ENCOUNTER — Encounter: Payer: Self-pay | Admitting: Family Medicine

## 2022-03-26 NOTE — Assessment & Plan Note (Signed)
Managed by endo and controlled 

## 2022-03-26 NOTE — Assessment & Plan Note (Signed)
Hyperlipidemia:Low fat diet discussed and encouraged. ? ? ?Lipid Panel  ?Lab Results  ?Component Value Date  ? CHOL 203 (H) 03/07/2022  ? HDL 67 03/07/2022  ? LDLCALC 120 (H) 03/07/2022  ? TRIG 88 03/07/2022  ? CHOLHDL 3.0 03/07/2022  ? ?Needs to reduce fat in diet ? ? ?

## 2022-03-26 NOTE — Assessment & Plan Note (Signed)
Encouraged to eat more frequently and small amounts ?

## 2022-03-26 NOTE — Addendum Note (Signed)
Addended by: Fayrene Helper on: 03/26/2022 10:22 AM ? ? Modules accepted: Level of Service ? ?

## 2022-03-26 NOTE — Progress Notes (Signed)
? ?  Megan Gibbs     MRN: [id number]      DOB: [birth year] ? ? ?HPI ?[name] is here for follow up and re-evaluation of chronic medical conditions, medication management and review of any available recent lab and radiology data.  ?Preventive health is updated, specifically  Cancer screening and Immunization.   ?Questions or concerns regarding consultations or procedures which the PT has had in the interim are  addressed. ?The PT denies any adverse reactions to current medications since the last visit.  ?There are no new concerns.  ?Feels cold and c/o poor appetite and weight loss , in part as she is running with her daughter getting treatment for breast cancer  ? ?ROS ?Denies recent fever or chills. ?Denies sinus pressure, nasal congestion, ear pain or sore throat. ?Denies chest congestion, productive cough or wheezing. ?Denies chest pains, palpitations and leg swelling ?Denies abdominal pain, nausea, vomiting,diarrhea or constipation.   ?Denies dysuria, frequency, hesitancy or incontinence. ?Denies joint pain, swelling and limitation in mobility. ?Denies headaches, seizures, numbness, or tingling. ?Denies depression, anxiety or insomnia. ?Denies skin break down or rash. ? ? ?PE ? ?BP 123/77   Pulse 68   Ht '5\' 2"'$  (1.575 m)   Wt 109 lb (49.4 kg)   SpO2 98%   BMI 19.94 kg/m?  ? ?Patient alert and oriented and in no cardiopulmonary distress. ? ?HEENT: No facial asymmetry, EOMI,     Neck supple . ? ?Chest: Clear to auscultation bilaterally. ? ?CVS: S1, S2 no murmurs, no S3.Regular rate. ? ?ABD: Soft non tender.  ? ?Ext: No edema ? ?MS: Adequate ROM spine, shoulders, hips and knees. ? ?Skin: Intact, no ulcerations or rash noted. ? ?Psych: Good eye contact, normal affect. Memory intact not anxious or depressed appearing. ? ?CNS: CN 2-12 intact, power,  normal throughout.no focal deficits noted. ? ? ?Assessment & Plan ? ?Poor appetite ?Encouraged to eat more frequently and small amounts ? ?Hyperthyroidism ?Managed by  endo and controlled ? ?Dyslipidemia, goal LDL below 100 ?Hyperlipidemia:Low fat diet discussed and encouraged. ? ? ?Lipid Panel  ?Lab Results  ?Component Value Date  ? CHOL 203 (H) 03/07/2022  ? HDL 67 03/07/2022  ? LDLCALC 120 (H) 03/07/2022  ? TRIG 88 03/07/2022  ? CHOLHDL 3.0 03/07/2022  ? ?Needs to reduce fat in diet ? ? ? ?

## 2022-04-03 ENCOUNTER — Other Ambulatory Visit: Payer: Self-pay | Admitting: Family Medicine

## 2022-04-26 DIAGNOSIS — H25813 Combined forms of age-related cataract, bilateral: Secondary | ICD-10-CM | POA: Diagnosis not present

## 2022-04-26 DIAGNOSIS — H01004 Unspecified blepharitis left upper eyelid: Secondary | ICD-10-CM | POA: Diagnosis not present

## 2022-04-26 DIAGNOSIS — H01001 Unspecified blepharitis right upper eyelid: Secondary | ICD-10-CM | POA: Diagnosis not present

## 2022-04-26 DIAGNOSIS — H01002 Unspecified blepharitis right lower eyelid: Secondary | ICD-10-CM | POA: Diagnosis not present

## 2022-04-26 DIAGNOSIS — R69 Illness, unspecified: Secondary | ICD-10-CM | POA: Diagnosis not present

## 2022-04-27 DIAGNOSIS — R69 Illness, unspecified: Secondary | ICD-10-CM | POA: Diagnosis not present

## 2022-05-03 ENCOUNTER — Other Ambulatory Visit: Payer: Self-pay | Admitting: Family Medicine

## 2022-05-23 ENCOUNTER — Ambulatory Visit: Payer: Medicare HMO | Admitting: "Endocrinology

## 2022-06-05 ENCOUNTER — Other Ambulatory Visit: Payer: Self-pay | Admitting: Family Medicine

## 2022-06-07 DIAGNOSIS — H25811 Combined forms of age-related cataract, right eye: Secondary | ICD-10-CM | POA: Diagnosis not present

## 2022-06-08 ENCOUNTER — Encounter (HOSPITAL_COMMUNITY)
Admission: RE | Admit: 2022-06-08 | Discharge: 2022-06-08 | Disposition: A | Discharge status: Home / Self Care | Payer: No Typology Code available for payment source | Source: Ambulatory Visit | Attending: Ophthalmology | Admitting: Ophthalmology

## 2022-06-08 ENCOUNTER — Other Ambulatory Visit: Payer: Self-pay

## 2022-06-08 ENCOUNTER — Encounter (HOSPITAL_COMMUNITY): Payer: Self-pay

## 2022-06-08 HISTORY — DX: Hypothyroidism, unspecified: E03.9

## 2022-06-08 NOTE — H&P (Signed)
Surgical History & Physical  Patient Name: Megan Gibbs DOB: May 15, 1951  Surgery: Cataract extraction with intraocular lens implant phacoemulsification; Right Eye  Surgeon: Baruch Goldmann MD Surgery Date:  06-15-22 Pre-Op Date:  06-04-22  HPI: A 35 Yr. old female patient is referred by Dr Hassell Done for cataract eval. 1. 1. The patient complains of nighttime light - car headlights, street lamps etc. glare causing poor vision, which began many years ago. Both eyes are affected. The episode is gradual. The condition's severity increased since last visit. Symptoms occur when the patient is driving and outside. This is negatively affecting the patient's quality of life and the patient is unable to function adequately in life with the current level of vision. HPI was performed by Baruch Goldmann .  Medical History: Cataracts White without pressure retinal deg, Retinal Schisis Anxiety, Back pain Thyroid Problems  Review of Systems Allergic/Immunologic Seasonal Allergies All recorded systems are negative except as noted above.  Social   Never smoked   Medication  Fosamax, Levothyroxine, Alendronate, Calcium, Multivitamin,   Sx/Procedures  Back Surgery, Tubal Ligation, Lumpectomy (Breast) R,   Drug Allergies   NKDA  History & Physical: Heent: Cataract, right eye NECK: supple without bruits LUNGS: lungs clear to auscultation CV: regular rate and rhythm Abdomen: soft and non-tender Impression & Plan: Assessment: 1.  COMBINED FORMS AGE RELATED CATARACT; Both Eyes (H25.813) 2.  BLEPHARITIS; Right Upper Lid, Right Lower Lid, Left Upper Lid, Left Lower Lid (H01.001, H01.002,H01.004,H01.005) 3.  DERMATOCHALASIS, no surgery; Right Upper Lid, Left Upper Lid (H02.831, H02.834) 4.  ARCUS SENILIS; Both Eyes (H18.413)  Plan: 1.  Cataract accounts for the patient's decreased vision. This visual impairment is not correctable with a tolerable change in glasses or contact lenses. Cataract surgery with  an implantation of a new lens should significantly improve the visual and functional status of the patient. Discussed all risks, benefits, alternatives, and potential complications. Discussed the procedures and recovery. Patient desires to have surgery. A-scan ordered and performed today for intra-ocular lens calculations. The surgery will be performed in order to improve vision for driving, reading, and for eye examinations. Recommend phacoemulsification with intra-ocular lens. Recommend Dextenza for post-operative pain and inflammation. Right Eye worse - first. Dilates well - shugarcaine by protocol. Declines multifocal.  2.  Recommend regular lid cleaning.  3.  Asymptomatic, recommend observation for now. Findings, prognosis and treatment options reviewed.  4.  Discussed significance of finding Answered patient questions about finding

## 2022-06-15 ENCOUNTER — Ambulatory Visit (HOSPITAL_BASED_OUTPATIENT_CLINIC_OR_DEPARTMENT_OTHER): Payer: No Typology Code available for payment source | Admitting: Anesthesiology

## 2022-06-15 ENCOUNTER — Encounter (HOSPITAL_COMMUNITY): Payer: Self-pay | Admitting: Ophthalmology

## 2022-06-15 ENCOUNTER — Ambulatory Visit (HOSPITAL_COMMUNITY)
Admission: RE | Admit: 2022-06-15 | Discharge: 2022-06-15 | Disposition: A | Discharge status: Home / Self Care | Payer: No Typology Code available for payment source | Attending: Ophthalmology | Admitting: Ophthalmology

## 2022-06-15 ENCOUNTER — Ambulatory Visit (HOSPITAL_COMMUNITY): Payer: No Typology Code available for payment source | Admitting: Anesthesiology

## 2022-06-15 ENCOUNTER — Encounter (HOSPITAL_COMMUNITY)
Admission: RE | Disposition: A | Discharge status: Home / Self Care | Payer: Self-pay | Source: Home / Self Care | Attending: Ophthalmology

## 2022-06-15 DIAGNOSIS — D649 Anemia, unspecified: Secondary | ICD-10-CM | POA: Diagnosis not present

## 2022-06-15 DIAGNOSIS — E039 Hypothyroidism, unspecified: Secondary | ICD-10-CM | POA: Diagnosis not present

## 2022-06-15 DIAGNOSIS — F419 Anxiety disorder, unspecified: Secondary | ICD-10-CM | POA: Insufficient documentation

## 2022-06-15 DIAGNOSIS — F418 Other specified anxiety disorders: Secondary | ICD-10-CM | POA: Diagnosis not present

## 2022-06-15 DIAGNOSIS — F32A Depression, unspecified: Secondary | ICD-10-CM | POA: Insufficient documentation

## 2022-06-15 DIAGNOSIS — H25811 Combined forms of age-related cataract, right eye: Secondary | ICD-10-CM | POA: Insufficient documentation

## 2022-06-15 SURGERY — PHACOEMULSIFICATION, CATARACT, WITH IOL INSERTION
Anesthesia: Monitor Anesthesia Care | Site: Eye | Laterality: Right

## 2022-06-15 MED ORDER — EPINEPHRINE PF 1 MG/ML IJ SOLN
INTRAMUSCULAR | Status: AC
  Filled 2022-06-15: qty 2

## 2022-06-15 MED ORDER — STERILE WATER FOR IRRIGATION IR SOLN
Status: DC | PRN
  Administered 2022-06-15: 2520 mL

## 2022-06-15 MED ORDER — NEOMYCIN-POLYMYXIN-DEXAMETH 3.5-10000-0.1 OP SUSP
OPHTHALMIC | Status: DC | PRN
  Administered 2022-06-15: 1 [drp] via OPHTHALMIC

## 2022-06-15 MED ORDER — MIDAZOLAM HCL 2 MG/2ML IJ SOLN
INTRAMUSCULAR | Status: AC
  Filled 2022-06-15: qty 2

## 2022-06-15 MED ORDER — LIDOCAINE HCL (PF) 1 % IJ SOLN
INTRAOCULAR | Status: DC | PRN
  Administered 2022-06-15: 1 mL via OPHTHALMIC

## 2022-06-15 MED ORDER — SODIUM CHLORIDE 0.9% FLUSH
INTRAVENOUS | Status: DC | PRN
  Administered 2022-06-15: 10 mL via INTRAVENOUS

## 2022-06-15 MED ORDER — SODIUM HYALURONATE 23MG/ML IO SOSY
PREFILLED_SYRINGE | INTRAOCULAR | Status: DC | PRN
  Administered 2022-06-15: 0.6 mL via INTRAOCULAR

## 2022-06-15 MED ORDER — BSS IO SOLN
INTRAOCULAR | Status: DC | PRN
  Administered 2022-06-15: 15 mL via INTRAOCULAR

## 2022-06-15 MED ORDER — TETRACAINE HCL 0.5 % OP SOLN
1.0000 [drp] | OPHTHALMIC | Status: DC | PRN
  Administered 2022-06-15 (×2): 1 [drp] via OPHTHALMIC

## 2022-06-15 MED ORDER — SODIUM HYALURONATE 10 MG/ML IO SOLUTION
PREFILLED_SYRINGE | INTRAOCULAR | Status: DC | PRN
  Administered 2022-06-15: 0.85 mL via INTRAOCULAR

## 2022-06-15 MED ORDER — MIDAZOLAM HCL 5 MG/5ML IJ SOLN
INTRAMUSCULAR | Status: DC | PRN
  Administered 2022-06-15: 2 mg via INTRAVENOUS

## 2022-06-15 MED ORDER — TROPICAMIDE 1 % OP SOLN
1.0000 [drp] | OPHTHALMIC | Status: DC | PRN
  Administered 2022-06-15 (×2): 1 [drp] via OPHTHALMIC

## 2022-06-15 MED ORDER — POVIDONE-IODINE 5 % OP SOLN
OPHTHALMIC | Status: DC | PRN
  Administered 2022-06-15: 1 via OPHTHALMIC

## 2022-06-15 MED ORDER — LIDOCAINE HCL 3.5 % OP GEL: 1.0000 | Freq: Once | OPHTHALMIC | Status: DC

## 2022-06-15 MED ORDER — PHENYLEPHRINE HCL 2.5 % OP SOLN
1.0000 [drp] | OPHTHALMIC | Status: DC | PRN
  Administered 2022-06-15 (×2): 1 [drp] via OPHTHALMIC

## 2022-06-15 MED ORDER — EPINEPHRINE PF 1 MG/ML IJ SOLN
INTRAOCULAR | Status: DC | PRN
  Administered 2022-06-15: 500 mL

## 2022-06-15 SURGICAL SUPPLY — 15 items
CATARACT SUITE SIGHTPATH (MISCELLANEOUS) ×2 IMPLANT
CLOTH BEACON ORANGE TIMEOUT ST (SAFETY) ×3 IMPLANT
EYE SHIELD UNIVERSAL CLEAR (GAUZE/BANDAGES/DRESSINGS) ×1 IMPLANT
FEE CATARACT SUITE SIGHTPATH (MISCELLANEOUS) ×2 IMPLANT
GLOVE BIOGEL PI IND STRL 7.0 (GLOVE) ×4 IMPLANT
GLOVE BIOGEL PI INDICATOR 7.0 (GLOVE) ×2
LENS IOL RAYNER 21.5 (Intraocular Lens) ×2 IMPLANT
LENS IOL RAYONE EMV 21.5 (Intraocular Lens) IMPLANT
PAD ARMBOARD 7.5X6 YLW CONV (MISCELLANEOUS) ×3 IMPLANT
RING MALYGIN 7.0 (MISCELLANEOUS) IMPLANT
SYR TB 1ML LL NO SAFETY (SYRINGE) ×3 IMPLANT
TAPE SURG TRANSPORE 1 IN (GAUZE/BANDAGES/DRESSINGS) IMPLANT
TAPE SURGICAL TRANSPORE 1 IN (GAUZE/BANDAGES/DRESSINGS) ×2
TIP IRRIGATON/ASPIRATION (MISCELLANEOUS) ×1 IMPLANT
WATER STERILE IRR 250ML POUR (IV SOLUTION) ×3 IMPLANT

## 2022-06-15 NOTE — Transfer of Care (Signed)
Immediate Anesthesia Transfer of Care Note  Patient: Megan Gibbs  Procedure(s) Performed: CATARACT EXTRACTION PHACO AND INTRAOCULAR LENS PLACEMENT (IOC) (Right: Eye)  Patient Location: Short Stay  Anesthesia Type:General  Level of Consciousness: awake  Airway & Oxygen Therapy: Patient Spontanous Breathing  Post-op Assessment: Report given to RN  Post vital signs: Reviewed and stable  Last Vitals:  Vitals Value Taken Time  BP    Temp    Pulse    Resp    SpO2      Last Pain:  Vitals:   06/15/22 0805  TempSrc: Oral         Complications: No notable events documented.

## 2022-06-15 NOTE — Anesthesia Postprocedure Evaluation (Signed)
Anesthesia Post Note  Patient: Megan Gibbs  Procedure(s) Performed: CATARACT EXTRACTION PHACO AND INTRAOCULAR LENS PLACEMENT (IOC) (Right: Eye)  Patient location during evaluation: Short Stay Anesthesia Type: MAC Level of consciousness: awake and alert Pain management: pain level controlled Vital Signs Assessment: post-procedure vital signs reviewed and stable Respiratory status: spontaneous breathing Cardiovascular status: blood pressure returned to baseline and stable Postop Assessment: no apparent nausea or vomiting Anesthetic complications: no   No notable events documented.   Last Vitals:  Vitals:   06/15/22 0805 06/15/22 0916  BP: (!) 142/92 112/64  Pulse: 74 71  Resp: 18 18  Temp: 36.9 C 36.7 C  SpO2: 100% 100%    Last Pain:  Vitals:   06/15/22 0916  TempSrc: Oral  PainSc: 0-No pain                 Chun Sellen

## 2022-06-15 NOTE — Op Note (Signed)
Date of procedure: 06/15/22  Pre-operative diagnosis:  Visually significant combined form age-related cataract, Right Eye (H25.811)  Post-operative diagnosis:  Visually significant combined form age-related cataract, Right Eye (H25.811)  Procedure: Removal of cataract via phacoemulsification and insertion of intra-ocular lens Rayner [id number] +21.5D into the capsular bag of the Right Eye  Attending surgeon: Gerda Diss. Sallyann Kinnaird, MD, MA  Anesthesia: MAC, Topical Akten  Complications: None  Estimated Blood Loss: <35m (minimal)  Specimens: None  Implants: As above  Indications:  Visually significant age-related cataract, Right Eye  Procedure:  The patient was seen and identified in the pre-operative area. The operative eye was identified and dilated.  The operative eye was marked.  Topical anesthesia was administered to the operative eye.     The patient was then to the operative suite and placed in the supine position.  A timeout was performed confirming the patient, procedure to be performed, and all other relevant information.   The patient's face was prepped and draped in the usual fashion for intra-ocular surgery.  A lid speculum was placed into the operative eye and the surgical microscope moved into place and focused.  A superotemporal paracentesis was created using a 20 gauge paracentesis blade.  Shugarcaine was injected into the anterior chamber.  Viscoelastic was injected into the anterior chamber.  A temporal clear-corneal main wound incision was created using a 2.438mmicrokeratome.  A continuous curvilinear capsulorrhexis was initiated using an irrigating cystitome and completed using capsulorrhexis forceps.  Hydrodissection and hydrodeliniation were performed.  Viscoelastic was injected into the anterior chamber.  A phacoemulsification handpiece and a chopper as a second instrument were used to remove the nucleus and epinucleus. The irrigation/aspiration handpiece was used to remove any  remaining cortical material.   The capsular bag was reinflated with viscoelastic, checked, and found to be intact.  The intraocular lens was inserted into the capsular bag.  The irrigation/aspiration handpiece was used to remove any remaining viscoelastic.  The clear corneal wound and paracentesis wounds were then hydrated and checked with Weck-Cels to be watertight.  The lid-speculum was removed.  The drape was removed.  The patient's face was cleaned with a wet and dry 4x4.   Maxitrol was instilled in the eye. A clear shield was taped over the eye. The patient was taken to the post-operative care unit in good condition, having tolerated the procedure well.  Post-Op Instructions: The patient will follow up at [facility] a same day post-operative evaluation and will receive all other orders and instructions.

## 2022-06-15 NOTE — Interval H&P Note (Signed)
History and Physical Interval Note:  06/15/2022 8:49 AM  Megan Gibbs  has presented today for surgery, with the diagnosis of combined forms age related cataract; right.  The various methods of treatment have been discussed with the patient and family. After consideration of risks, benefits and other options for treatment, the patient has consented to  Procedure(s) with comments: CATARACT EXTRACTION PHACO AND INTRAOCULAR LENS PLACEMENT (IOC) (Right) - right as a surgical intervention.  The patient's history has been reviewed, patient examined, no change in status, stable for surgery.  I have reviewed the patient's chart and labs.  Questions were answered to the patient's satisfaction.     Baruch Goldmann

## 2022-06-15 NOTE — Anesthesia Preprocedure Evaluation (Signed)
Anesthesia Evaluation  Patient identified by MRN, date of birth, ID band Patient awake    Reviewed: Allergy & Precautions, H&P , NPO status , Patient's Chart, lab work & pertinent test results, reviewed documented beta blocker date and time   Airway Mallampati: II  TM Distance: >3 FB Neck ROM: full    Dental no notable dental hx.    Pulmonary neg pulmonary ROS,    Pulmonary exam normal breath sounds clear to auscultation       Cardiovascular Exercise Tolerance: Good negative cardio ROS   Rhythm:regular Rate:Normal     Neuro/Psych PSYCHIATRIC DISORDERS Anxiety Depression negative neurological ROS     GI/Hepatic negative GI ROS, Neg liver ROS,   Endo/Other  Hypothyroidism   Renal/GU negative Renal ROS  negative genitourinary   Musculoskeletal   Abdominal   Peds  Hematology  (+) Blood dyscrasia, anemia ,   Anesthesia Other Findings   Reproductive/Obstetrics negative OB ROS                             Anesthesia Physical Anesthesia Plan  ASA: 2  Anesthesia Plan: MAC   Post-op Pain Management:    Induction:   PONV Risk Score and Plan:   Airway Management Planned:   Additional Equipment:   Intra-op Plan:   Post-operative Plan:   Informed Consent: I have reviewed the patients History and Physical, chart, labs and discussed the procedure including the risks, benefits and alternatives for the proposed anesthesia with the patient or authorized representative who has indicated his/her understanding and acceptance.     Dental Advisory Given  Plan Discussed with: CRNA  Anesthesia Plan Comments:         Anesthesia Quick Evaluation

## 2022-06-15 NOTE — Discharge Instructions (Addendum)
Please discharge patient when stable, will follow up today with Dr. Marisa Hua at the [facility] office at 10:20AM today following discharge.  Leave shield in place until visit.  All paperwork with discharge instructions will be given at the office.  [facility] Address:  [address]  [place], [place] [address]

## 2022-06-21 ENCOUNTER — Encounter (HOSPITAL_COMMUNITY): Payer: Self-pay | Admitting: Ophthalmology

## 2022-06-21 NOTE — Addendum Note (Signed)
Addendum  created 06/21/22 1333 by Karna Dupes, CRNA   Intraprocedure Staff edited

## 2022-06-28 NOTE — H&P (Signed)
Surgical History & Physical  Patient Name: Megan Gibbs DOB: Jul 04, 1951  Surgery: Cataract extraction with intraocular lens implant phacoemulsification; Left Eye  Surgeon: Baruch Goldmann MD Surgery Date:  07-09-22 Pre-Op Date:  06-21-22  HPI: A 26 Yr. old female patient 1. The patient is returning after cataract surgery. The right eye is affected. Status post cataract surgery, which began 1 weeks ago: Since the last visit, the affected area is doing well. The patient's vision is improved. Patient is following medication instructions. 2. 2. The patient is returning for a cataract follow-up of the left eye. Since the last visit, the affected area is worsening. The complaint is associated with difficulty reading small print on medicine bottles, difficulty driving at night due to halos/glare, and experiencing glare on bright sunny days. This is negatively affecting the patient's quality of life and the patient is unable to function adequately in life with the current level of vision. HPI was performed by Baruch Goldmann .  Medical History: Cataracts White without pressure retinal deg, Retinal Schisis Anxiety, Back pain Thyroid Problems  Review of Systems Allergic/Immunologic Seasonal Allergies All recorded systems are negative except as noted above.  Social   Never smoked   Medication Prednisolone-Moxifloxacin-Bromfenac,  Fosamax, Levothyroxine, Alendronate, Calcium, Multivitamin,   Sx/Procedures Phaco c IOL OD,  Back Surgery, Tubal Ligation, Lumpectomy (Breast) R,   Drug Allergies   NKDA  History & Physical: Heent: Cataract, Left eye NECK: supple without bruits LUNGS: lungs clear to auscultation CV: regular rate and rhythm Abdomen: soft and non-tender Impression & Plan: Assessment: 1.  CATARACT EXTRACTION STATUS; Right Eye (Z98.41) 2.  COMBINED FORMS AGE RELATED CATARACT; Left Eye (H25.812)  Plan: 1.  1 week after cataract surgery. Doing well with improved vision and normal eye  pressure. Call with any problems or concerns. Continue Pred-Moxi-Brom 2x/day for 3 more weeks.  2.  Cataract accounts for the patient's decreased vision. This visual impairment is not correctable with a tolerable change in glasses or contact lenses. Cataract surgery with an implantation of a new lens should significantly improve the visual and functional status of the patient. Discussed all risks, benefits, alternatives, and potential complications. Discussed the procedures and recovery. Patient desires to have surgery. A-scan ordered and performed today for intra-ocular lens calculations. The surgery will be performed in order to improve vision for driving, reading, and for eye examinations. Recommend phacoemulsification with intra-ocular lens. Recommend Dextenza for post-operative pain and inflammation. Left Eye. Surgery required to correct imbalance of vision. Dilates well - shugarcaine by protocol.

## 2022-07-02 ENCOUNTER — Encounter (HOSPITAL_COMMUNITY): Payer: Self-pay

## 2022-07-02 DIAGNOSIS — H25812 Combined forms of age-related cataract, left eye: Secondary | ICD-10-CM | POA: Diagnosis not present

## 2022-07-04 ENCOUNTER — Encounter (HOSPITAL_COMMUNITY)
Admission: RE | Admit: 2022-07-04 | Discharge: 2022-07-04 | Disposition: A | Discharge status: Home / Self Care | Payer: No Typology Code available for payment source | Source: Ambulatory Visit | Attending: Ophthalmology | Admitting: Ophthalmology

## 2022-07-05 ENCOUNTER — Other Ambulatory Visit: Payer: Self-pay | Admitting: Family Medicine

## 2022-07-09 ENCOUNTER — Encounter (HOSPITAL_COMMUNITY)
Admission: RE | Disposition: A | Discharge status: Home / Self Care | Payer: Self-pay | Source: Home / Self Care | Attending: Ophthalmology

## 2022-07-09 ENCOUNTER — Ambulatory Visit (HOSPITAL_BASED_OUTPATIENT_CLINIC_OR_DEPARTMENT_OTHER): Payer: No Typology Code available for payment source | Admitting: Anesthesiology

## 2022-07-09 ENCOUNTER — Ambulatory Visit (HOSPITAL_COMMUNITY)
Admission: RE | Admit: 2022-07-09 | Discharge: 2022-07-09 | Disposition: A | Discharge status: Home / Self Care | Payer: No Typology Code available for payment source | Attending: Ophthalmology | Admitting: Ophthalmology

## 2022-07-09 ENCOUNTER — Ambulatory Visit (HOSPITAL_COMMUNITY): Payer: No Typology Code available for payment source | Admitting: Anesthesiology

## 2022-07-09 ENCOUNTER — Encounter (HOSPITAL_COMMUNITY): Payer: Self-pay | Admitting: Ophthalmology

## 2022-07-09 DIAGNOSIS — E039 Hypothyroidism, unspecified: Secondary | ICD-10-CM | POA: Diagnosis not present

## 2022-07-09 DIAGNOSIS — F32A Depression, unspecified: Secondary | ICD-10-CM | POA: Insufficient documentation

## 2022-07-09 DIAGNOSIS — H25812 Combined forms of age-related cataract, left eye: Secondary | ICD-10-CM | POA: Diagnosis not present

## 2022-07-09 DIAGNOSIS — F418 Other specified anxiety disorders: Secondary | ICD-10-CM | POA: Diagnosis not present

## 2022-07-09 DIAGNOSIS — F419 Anxiety disorder, unspecified: Secondary | ICD-10-CM | POA: Diagnosis not present

## 2022-07-09 DIAGNOSIS — Z79890 Hormone replacement therapy (postmenopausal): Secondary | ICD-10-CM | POA: Diagnosis not present

## 2022-07-09 DIAGNOSIS — D638 Anemia in other chronic diseases classified elsewhere: Secondary | ICD-10-CM | POA: Diagnosis not present

## 2022-07-09 DIAGNOSIS — H269 Unspecified cataract: Secondary | ICD-10-CM

## 2022-07-09 HISTORY — PX: CATARACT EXTRACTION W/PHACO: SHX586

## 2022-07-09 SURGERY — PHACOEMULSIFICATION, CATARACT, WITH IOL INSERTION
Anesthesia: Monitor Anesthesia Care | Site: Eye | Laterality: Left

## 2022-07-09 MED ORDER — TROPICAMIDE 1 % OP SOLN
1.0000 [drp] | OPHTHALMIC | Status: AC | PRN
Start: 2022-07-09 — End: 2022-07-09
  Administered 2022-07-09 (×3): 1 [drp] via OPHTHALMIC

## 2022-07-09 MED ORDER — LIDOCAINE HCL (PF) 1 % IJ SOLN
INTRAOCULAR | Status: DC | PRN
  Administered 2022-07-09: 1 mL via OPHTHALMIC

## 2022-07-09 MED ORDER — LIDOCAINE HCL 3.5 % OP GEL
1.0000 | Freq: Once | OPHTHALMIC | Status: AC
  Administered 2022-07-09: 1 via OPHTHALMIC

## 2022-07-09 MED ORDER — EPINEPHRINE PF 1 MG/ML IJ SOLN
INTRAMUSCULAR | Status: AC
  Filled 2022-07-09: qty 2

## 2022-07-09 MED ORDER — EPINEPHRINE PF 1 MG/ML IJ SOLN
INTRAOCULAR | Status: DC | PRN
  Administered 2022-07-09: 500 mL

## 2022-07-09 MED ORDER — PHENYLEPHRINE HCL 2.5 % OP SOLN
1.0000 [drp] | OPHTHALMIC | Status: AC | PRN
  Administered 2022-07-09 (×3): 1 [drp] via OPHTHALMIC

## 2022-07-09 MED ORDER — MIDAZOLAM HCL 2 MG/2ML IJ SOLN
INTRAMUSCULAR | Status: AC
  Filled 2022-07-09: qty 2

## 2022-07-09 MED ORDER — SODIUM HYALURONATE 23MG/ML IO SOSY
PREFILLED_SYRINGE | INTRAOCULAR | Status: DC | PRN
  Administered 2022-07-09: 0.6 mL via INTRAOCULAR

## 2022-07-09 MED ORDER — NEOMYCIN-POLYMYXIN-DEXAMETH 3.5-10000-0.1 OP SUSP
OPHTHALMIC | Status: DC | PRN
  Administered 2022-07-09: 1 [drp] via OPHTHALMIC

## 2022-07-09 MED ORDER — MIDAZOLAM HCL 2 MG/2ML IJ SOLN
INTRAMUSCULAR | Status: DC
Start: 2022-07-09 — End: 2022-07-09
  Filled 2022-07-09: qty 2

## 2022-07-09 MED ORDER — SODIUM HYALURONATE 10 MG/ML IO SOLUTION
PREFILLED_SYRINGE | INTRAOCULAR | Status: DC | PRN
  Administered 2022-07-09: 0.85 mL via INTRAOCULAR

## 2022-07-09 MED ORDER — MIDAZOLAM HCL 2 MG/2ML IJ SOLN
2.0000 mg | Freq: Once | INTRAMUSCULAR | Status: AC
  Administered 2022-07-09: 2 mg via INTRAVENOUS

## 2022-07-09 MED ORDER — STERILE WATER FOR IRRIGATION IR SOLN
Status: DC | PRN
  Administered 2022-07-09: 250 mL

## 2022-07-09 MED ORDER — BSS IO SOLN
INTRAOCULAR | Status: DC | PRN
  Administered 2022-07-09: 15 mL via INTRAOCULAR

## 2022-07-09 MED ORDER — TETRACAINE HCL 0.5 % OP SOLN
1.0000 [drp] | OPHTHALMIC | Status: AC | PRN
Start: 2022-07-09 — End: 2022-07-09
  Administered 2022-07-09 (×3): 1 [drp] via OPHTHALMIC

## 2022-07-09 MED ORDER — POVIDONE-IODINE 5 % OP SOLN
OPHTHALMIC | Status: DC | PRN
  Administered 2022-07-09: 1 via OPHTHALMIC

## 2022-07-09 SURGICAL SUPPLY — 13 items
CATARACT SUITE SIGHTPATH (MISCELLANEOUS) ×2 IMPLANT
CLOTH BEACON ORANGE TIMEOUT ST (SAFETY) ×3 IMPLANT
EYE SHIELD UNIVERSAL CLEAR (GAUZE/BANDAGES/DRESSINGS) ×1 IMPLANT
FEE CATARACT SUITE SIGHTPATH (MISCELLANEOUS) ×2 IMPLANT
GLOVE BIOGEL PI IND STRL 7.0 (GLOVE) ×4 IMPLANT
GLOVE BIOGEL PI INDICATOR 7.0 (GLOVE) ×2
LENS IOL RAYNER 22.0 (Intraocular Lens) ×2 IMPLANT
LENS IOL RAYONE EMV 22.0 (Intraocular Lens) IMPLANT
PAD ARMBOARD 7.5X6 YLW CONV (MISCELLANEOUS) ×3 IMPLANT
SYR TB 1ML LL NO SAFETY (SYRINGE) ×3 IMPLANT
TAPE SURG TRANSPORE 1 IN (GAUZE/BANDAGES/DRESSINGS) IMPLANT
TAPE SURGICAL TRANSPORE 1 IN (GAUZE/BANDAGES/DRESSINGS) ×2
WATER STERILE IRR 250ML POUR (IV SOLUTION) ×3 IMPLANT

## 2022-07-09 NOTE — Transfer of Care (Signed)
Immediate Anesthesia Transfer of Care Note  Patient: Megan Gibbs  Procedure(s) Performed: CATARACT EXTRACTION PHACO AND INTRAOCULAR LENS PLACEMENT (IOC) (Left: Eye)  Patient Location: Short Stay  Anesthesia Type:MAC  Level of Consciousness: awake, alert  and oriented  Airway & Oxygen Therapy: Patient Spontanous Breathing  Post-op Assessment: Report given to RN  Post vital signs: Reviewed and stable  Last Vitals:  Vitals Value Taken Time  BP    Temp    Pulse    Resp    SpO2      Last Pain:  Vitals:   07/09/22 1048  TempSrc: Oral  PainSc: 0-No pain      Patients Stated Pain Goal: 5 (10/24/84 2778)  Complications: No notable events documented.

## 2022-07-09 NOTE — Op Note (Signed)
Date of procedure: 07/09/22  Pre-operative diagnosis: Visually significant age-related combined cataract, Left Eye (H25.812)  Post-operative diagnosis: Visually significant age-related combined cataract, Left Eye (H25.812)  Procedure: Removal of cataract via phacoemulsification and insertion of intra-ocular lens Rayner RAO200E +22.0D into the capsular bag of the Left Eye  Attending surgeon: Gerda Diss. Avion Patella, MD, MA  Anesthesia: MAC, Topical Akten  Complications: None  Estimated Blood Loss: <89m (minimal)  Specimens: None  Implants: As above  Indications:  Visually significant age-related cataract, Left Eye  Procedure:  The patient was seen and identified in the pre-operative area. The operative eye was identified and dilated.  The operative eye was marked.  Topical anesthesia was administered to the operative eye.     The patient was then to the operative suite and placed in the supine position.  A timeout was performed confirming the patient, procedure to be performed, and all other relevant information.   The patient's face was prepped and draped in the usual fashion for intra-ocular surgery.  A lid speculum was placed into the operative eye and the surgical microscope moved into place and focused.  An inferotemporal paracentesis was created using a 20 gauge paracentesis blade.  Shugarcaine was injected into the anterior chamber.  Viscoelastic was injected into the anterior chamber.  A temporal clear-corneal main wound incision was created using a 2.487mmicrokeratome.  A continuous curvilinear capsulorrhexis was initiated using an irrigating cystitome and completed using capsulorrhexis forceps.  Hydrodissection and hydrodeliniation were performed.  Viscoelastic was injected into the anterior chamber.  A phacoemulsification handpiece and a chopper as a second instrument were used to remove the nucleus and epinucleus. The irrigation/aspiration handpiece was used to remove any remaining  cortical material.   The capsular bag was reinflated with viscoelastic, checked, and found to be intact.  The intraocular lens was inserted into the capsular bag.  The irrigation/aspiration handpiece was used to remove any remaining viscoelastic.  The clear corneal wound and paracentesis wounds were then hydrated and checked with Weck-Cels to be watertight.  Maxitrol was instilled in the eye. The lid-speculum was removed.  The drape was removed.  The patient's face was cleaned with a wet and dry 4x4.    A clear shield was taped over the eye. The patient was taken to the post-operative care unit in good condition, having tolerated the procedure well.  Post-Op Instructions: [facility] and will receive all other orders and instructions.

## 2022-07-09 NOTE — Anesthesia Postprocedure Evaluation (Signed)
Anesthesia Post Note  Patient: Megan Gibbs  Procedure(s) Performed: CATARACT EXTRACTION PHACO AND INTRAOCULAR LENS PLACEMENT (IOC) (Left: Eye)  Patient location during evaluation: PACU Anesthesia Type: MAC Level of consciousness: awake and alert Pain management: pain level controlled Vital Signs Assessment: post-procedure vital signs reviewed and stable Respiratory status: spontaneous breathing, nonlabored ventilation, respiratory function stable and patient connected to nasal cannula oxygen Cardiovascular status: stable and blood pressure returned to baseline Postop Assessment: no apparent nausea or vomiting Anesthetic complications: no   There were no known notable events for this encounter.   Last Vitals:  Vitals:   07/09/22 1048 07/09/22 1131  BP: (!) 135/59 115/87  Pulse: 78 71  Resp: 20 18  Temp: 36.6 C 36.4 C  SpO2: 100% 99%    Last Pain:  Vitals:   07/09/22 1131  TempSrc: Oral  PainSc: 0-No pain                 Trixie Rude

## 2022-07-09 NOTE — Interval H&P Note (Signed)
History and Physical Interval Note:  07/09/2022 11:06 AM  Megan Gibbs  has presented today for surgery, with the diagnosis of Combined forms age related cataract; left.  The various methods of treatment have been discussed with the patient and family. After consideration of risks, benefits and other options for treatment, the patient has consented to  Procedure(s) with comments: CATARACT EXTRACTION PHACO AND INTRAOCULAR LENS PLACEMENT ([place]) (Left) - left as a surgical intervention.  The patient's history has been reviewed, patient examined, no change in status, stable for surgery.  I have reviewed the patient's chart and labs.  Questions were answered to the patient's satisfaction.     Baruch Goldmann

## 2022-07-09 NOTE — Discharge Instructions (Signed)
Please discharge patient when stable, will follow up today with Dr. Kasee Hantz at the [facility] office immediately following discharge.  Leave shield in place until visit.  All paperwork with discharge instructions will be given at the office.  [facility] Address:  [address]  [place], [place] [address]  

## 2022-07-09 NOTE — Anesthesia Preprocedure Evaluation (Signed)
Anesthesia Evaluation  Patient identified by MRN, date of birth, ID band Patient awake    Reviewed: Allergy & Precautions, NPO status , Patient's Chart, lab work & pertinent test results  Airway Mallampati: II  TM Distance: >3 FB Neck ROM: Full    Dental  (+) Dental Advisory Given   Pulmonary neg pulmonary ROS,    Pulmonary exam normal breath sounds clear to auscultation       Cardiovascular negative cardio ROS Normal cardiovascular exam Rhythm:Regular Rate:Normal     Neuro/Psych PSYCHIATRIC DISORDERS Anxiety Depression negative neurological ROS     GI/Hepatic negative GI ROS, Neg liver ROS,   Endo/Other  Hypothyroidism Hyperthyroidism   Renal/GU negative Renal ROS  negative genitourinary   Musculoskeletal negative musculoskeletal ROS (+)   Abdominal   Peds negative pediatric ROS (+)  Hematology  (+) Blood dyscrasia, anemia ,   Anesthesia Other Findings   Reproductive/Obstetrics negative OB ROS                             Anesthesia Physical Anesthesia Plan  ASA: 2  Anesthesia Plan: MAC   Post-op Pain Management: Minimal or no pain anticipated   Induction:   PONV Risk Score and Plan: 0  Airway Management Planned: Nasal Cannula and Natural Airway  Additional Equipment:   Intra-op Plan:   Post-operative Plan:   Informed Consent: I have reviewed the patients History and Physical, chart, labs and discussed the procedure including the risks, benefits and alternatives for the proposed anesthesia with the patient or authorized representative who has indicated his/her understanding and acceptance.     Dental advisory given  Plan Discussed with: CRNA and Surgeon  Anesthesia Plan Comments:         Anesthesia Quick Evaluation

## 2022-07-09 NOTE — Anesthesia Procedure Notes (Signed)
Procedure Name: MAC Date/Time: 07/09/2022 11:16 AM  Performed by: Orlie Dakin, CRNAPre-anesthesia Checklist: Patient identified, Emergency Drugs available, Suction available and Patient being monitored Patient Re-evaluated:Patient Re-evaluated prior to induction Oxygen Delivery Method: Nasal cannula Placement Confirmation: positive ETCO2

## 2022-07-10 ENCOUNTER — Encounter (HOSPITAL_COMMUNITY): Payer: Self-pay | Admitting: Ophthalmology

## 2022-07-11 ENCOUNTER — Encounter: Payer: No Typology Code available for payment source | Admitting: Family Medicine

## 2022-07-16 DIAGNOSIS — R69 Illness, unspecified: Secondary | ICD-10-CM | POA: Diagnosis not present

## 2022-07-17 DIAGNOSIS — R69 Illness, unspecified: Secondary | ICD-10-CM | POA: Diagnosis not present

## 2022-07-20 ENCOUNTER — Encounter: Payer: Self-pay | Admitting: Family Medicine

## 2022-07-20 ENCOUNTER — Ambulatory Visit (INDEPENDENT_AMBULATORY_CARE_PROVIDER_SITE_OTHER): Payer: No Typology Code available for payment source | Admitting: Family Medicine

## 2022-07-20 VITALS — BP 130/70 | HR 111 | Ht 62.0 in | Wt 110.1 lb

## 2022-07-20 DIAGNOSIS — J3089 Other allergic rhinitis: Secondary | ICD-10-CM | POA: Diagnosis not present

## 2022-07-20 DIAGNOSIS — F419 Anxiety disorder, unspecified: Secondary | ICD-10-CM | POA: Diagnosis not present

## 2022-07-20 DIAGNOSIS — E785 Hyperlipidemia, unspecified: Secondary | ICD-10-CM | POA: Diagnosis not present

## 2022-07-20 DIAGNOSIS — R630 Anorexia: Secondary | ICD-10-CM | POA: Diagnosis not present

## 2022-07-20 DIAGNOSIS — E559 Vitamin D deficiency, unspecified: Secondary | ICD-10-CM | POA: Diagnosis not present

## 2022-07-20 DIAGNOSIS — R69 Illness, unspecified: Secondary | ICD-10-CM | POA: Diagnosis not present

## 2022-07-20 DIAGNOSIS — Z1231 Encounter for screening mammogram for malignant neoplasm of breast: Secondary | ICD-10-CM | POA: Diagnosis not present

## 2022-07-20 DIAGNOSIS — E890 Postprocedural hypothyroidism: Secondary | ICD-10-CM | POA: Diagnosis not present

## 2022-07-20 NOTE — Patient Instructions (Addendum)
Annual exam Nov 9 or after, call if you need me sooner  Continue eating small amounts regularly, 2 ensures/ day, do not overeat fried and fatty foods  Please schedule mammogram at checkout  Thankful cataract surgery is a success  Thanks for choosing [facility], we consider it a privelige to serve you.

## 2022-07-21 DIAGNOSIS — R69 Illness, unspecified: Secondary | ICD-10-CM | POA: Diagnosis not present

## 2022-07-21 LAB — COMPREHENSIVE METABOLIC PANEL
ALT: 18 IU/L (ref 0–32)
AST: 23 IU/L (ref 0–40)
Albumin/Globulin Ratio: 1.7 (ref 1.2–2.2)
Albumin: 4.7 g/dL (ref 3.9–4.9)
Alkaline Phosphatase: 90 IU/L (ref 44–121)
BUN/Creatinine Ratio: 16 (ref 12–28)
BUN: 12 mg/dL (ref 8–27)
Bilirubin Total: 0.4 mg/dL (ref 0.0–1.2)
CO2: 22 mmol/L (ref 20–29)
Calcium: 9.7 mg/dL (ref 8.7–10.3)
Chloride: 103 mmol/L (ref 96–106)
Creatinine, Ser: 0.73 mg/dL (ref 0.57–1.00)
Globulin, Total: 2.7 g/dL (ref 1.5–4.5)
Glucose: 81 mg/dL (ref 70–99)
Potassium: 4.7 mmol/L (ref 3.5–5.2)
Sodium: 140 mmol/L (ref 134–144)
Total Protein: 7.4 g/dL (ref 6.0–8.5)
eGFR: 88 mL/min/{1.73_m2} (ref >59)

## 2022-07-21 LAB — TSH: TSH: 0.013 u[IU]/mL — ABNORMAL LOW (ref 0.450–4.500)

## 2022-07-21 LAB — T4, FREE: Free T4: 1.64 ng/dL (ref 0.82–1.77)

## 2022-07-22 ENCOUNTER — Encounter: Payer: Self-pay | Admitting: Family Medicine

## 2022-07-22 NOTE — Assessment & Plan Note (Signed)
Stable and controlled well on as needed medication

## 2022-07-22 NOTE — Assessment & Plan Note (Signed)
Updated lab needed at/ before next visit.   

## 2022-07-22 NOTE — Assessment & Plan Note (Signed)
Improved and on no medication

## 2022-07-22 NOTE — Progress Notes (Signed)
   Megan Gibbs     MRN: [id number]      DOB: [birth year]   HPI Megan Gibbs is here for follow up and re-evaluation of chronic medical conditions, medication management and review of any available recent lab and radiology data.  Preventive health is updated, specifically  Cancer screening and Immunization.   Questions or concerns regarding consultations or procedures which the PT has had in the interim are  addressed. The PT denies any adverse reactions to current medications since the last visit.  Still worlking on weigt gaints   ROS Denies recent fever or chills. Denies sinus pressure, nasal congestion, ear pain or sore throat. Denies chest congestion, productive cough or wheezing. Denies chest pains, palpitations and leg swelling Denies abdominal pain, nausea, vomiting,diarrhea or constipation.   Denies dysuria, frequency, hesitancy or incontinence. Denies joint pain, swelling and limitation in mobility. Denies headaches, seizures, numbness, or tingling. Denies depression, anxiety or insomnia. Denies skin break down or rash.   PE  BP 130/70   Pulse (!) 111   Ht '5\' 2"'$  (1.575 m)   Wt 110 lb 1.3 oz (49.9 kg)   SpO2 99%   BMI 20.13 kg/m   Patient alert and oriented and in no cardiopulmonary distress.  HEENT: No facial asymmetry, EOMI,     Neck supple .  Chest: Clear to auscultation bilaterally.  CVS: S1, S2 no murmurs, no S3.Regular rate.  ABD: Soft non tender.   Ext: No edema  MS: Adequate ROM spine, shoulders, hips and knees.  Skin: Intact, no ulcerations or rash noted.  Psych: Good eye contact, normal affect. Memory intact not anxious or depressed appearing.  CNS: CN 2-12 intact, power,  normal throughout.no focal deficits noted.   Assessment & Plan  Dyslipidemia, goal LDL below 100 Hyperlipidemia:Low fat diet discussed and encouraged.   Lipid Panel  Lab Results  Component Value Date   CHOL 203 (H) 03/07/2022   HDL 67 03/07/2022   LDLCALC 120 (H)  03/07/2022   TRIG 88 03/07/2022   CHOLHDL 3.0 03/07/2022       Poor appetite Encouraged to take 1 to 2 supplements  For weight gain  Anxiety Improved and on no medication  Allergic rhinitis Stable and controlled well on as needed medication  Vitamin D deficiency Updated lab needed at/ before next visit.

## 2022-07-22 NOTE — Assessment & Plan Note (Signed)
Encouraged to take 1 to 2 supplements  For weight gain

## 2022-07-22 NOTE — Assessment & Plan Note (Signed)
Hyperlipidemia:Low fat diet discussed and encouraged.   Lipid Panel  Lab Results  Component Value Date   CHOL 203 (H) 03/07/2022   HDL 67 03/07/2022   LDLCALC 120 (H) 03/07/2022   TRIG 88 03/07/2022   CHOLHDL 3.0 03/07/2022

## 2022-07-30 ENCOUNTER — Encounter: Payer: Self-pay | Admitting: "Endocrinology

## 2022-07-30 ENCOUNTER — Ambulatory Visit (INDEPENDENT_AMBULATORY_CARE_PROVIDER_SITE_OTHER): Payer: No Typology Code available for payment source | Admitting: "Endocrinology

## 2022-07-30 ENCOUNTER — Other Ambulatory Visit: Payer: Self-pay | Admitting: Family Medicine

## 2022-07-30 ENCOUNTER — Ambulatory Visit (INDEPENDENT_AMBULATORY_CARE_PROVIDER_SITE_OTHER): Payer: No Typology Code available for payment source

## 2022-07-30 VITALS — BP 112/64 | HR 68 | Ht 62.0 in | Wt 112.2 lb

## 2022-07-30 DIAGNOSIS — E559 Vitamin D deficiency, unspecified: Secondary | ICD-10-CM

## 2022-07-30 DIAGNOSIS — M810 Age-related osteoporosis without current pathological fracture: Secondary | ICD-10-CM | POA: Diagnosis not present

## 2022-07-30 DIAGNOSIS — Z0000 Encounter for general adult medical examination without abnormal findings: Secondary | ICD-10-CM

## 2022-07-30 DIAGNOSIS — E890 Postprocedural hypothyroidism: Secondary | ICD-10-CM | POA: Diagnosis not present

## 2022-07-30 DIAGNOSIS — E782 Mixed hyperlipidemia: Secondary | ICD-10-CM | POA: Diagnosis not present

## 2022-07-30 MED ORDER — LEVOTHYROXINE SODIUM 25 MCG PO TABS: 25.0000 ug | ORAL_TABLET | Freq: Every day | ORAL | 1 refills | Status: DC

## 2022-07-30 NOTE — Progress Notes (Signed)
07/30/2022     Endocrinology follow-up note   Subjective:    Patient ID: Megan Gibbs, female    DOB: 04-24-1951,    Past Medical History:  Diagnosis Date   Allergic rhinitis    Anemia    Anxiety    Back pain    Depression    Hypokalemia    Hypothyroidism    Osteoporosis    Prediabetes    Seasonal allergies    Seasonal allergies    Vertigo    Wears glasses    Past Surgical History:  Procedure Laterality Date   BACK SURGERY  1992 & 2009   Dr. Joya Salm    BREAST BIOPSY     BREAST EXCISIONAL BIOPSY     BREAST LUMPECTOMY WITH RADIOACTIVE SEED LOCALIZATION Right 12/01/2014   Procedure: RIGHT BREAST LUMPECTOMY WITH RADIOACTIVE SEED LOCALIZATION;  Surgeon: Fanny Skates, MD;  Location: [facility];  Service: General;  Laterality: Right;   CATARACT EXTRACTION W/PHACO Right 06/15/2022   Procedure: CATARACT EXTRACTION PHACO AND INTRAOCULAR LENS PLACEMENT ([place]);  Surgeon: Baruch Goldmann, MD;  Location: [facility];  Service: Ophthalmology;  Laterality: Right;  CDE: 10.49   CATARACT EXTRACTION W/PHACO Left 07/09/2022   Procedure: CATARACT EXTRACTION PHACO AND INTRAOCULAR LENS PLACEMENT ([facility]);  Surgeon: Baruch Goldmann, MD;  Location: [facility];  Service: Ophthalmology;  Laterality: Left;  CDE: 9.15   COLONOSCOPY N/A 04/19/2014   Procedure: COLONOSCOPY;  Surgeon: Daneil Dolin, MD;  Location: [facility];  Service: Endoscopy;  Laterality: N/A;  9:30 AM   DILATION AND CURETTAGE OF UTERUS     TUBAL LIGATION     1976   Social History   Socioeconomic History   Marital status: Divorced    Spouse name: Not on file   Number of children: 3   Years of education: Not on file   Highest education level: Not on file  Occupational History   Occupation: disabled     Employer: UNEMPLOYED  Tobacco Use   Smoking status: Never   Smokeless tobacco: Never  Vaping Use   Vaping Use: Never used  Substance and Sexual Activity   Alcohol use: No   Drug use: No   Sexual activity: Yes     Birth control/protection: Post-menopausal  Other Topics Concern   Not on file  Social History Narrative   DOING A LOT [place].    Social Determinants of Health   Financial Resource Strain: Low Risk  (07/30/2022)   Overall Financial Resource Strain (CARDIA)    Difficulty of Paying Living Expenses: Not hard at all  Food Insecurity: No Food Insecurity (07/30/2022)   Hunger Vital Sign    Worried About Running Out of Food in the Last Year: Never true    Ran Out of Food in the Last Year: Never true  Transportation Needs: No Transportation Needs (07/30/2022)   PRAPARE - Hydrologist (Medical): No    Lack of Transportation (Non-Medical): No  Physical Activity: Inactive (07/30/2022)   Exercise Vital Sign    Days of Exercise per Week: 0 days    Minutes of Exercise per Session: 0 min  Stress: No Stress Concern Present (07/30/2022)   [place]    Feeling of Stress : Not at all  Social Connections: Moderately Isolated (07/30/2022)   Social Connection and Isolation Panel [NHANES]    Frequency of Communication with Friends and Family: More than three times a  week    Frequency of Social Gatherings with Friends and Family: More than three times a week    Attends Religious Services: More than 4 times per year    Active Member of [employer] or Organizations: No    Attends Archivist Meetings: Never    Marital Status: Divorced   Outpatient Encounter Medications as of 07/30/2022  Medication Sig   alendronate (FOSAMAX) 70 MG tablet TAKE 1 TABLET ONCE A WEEK 30 MINUTES BEFORE BREAKFAST WITH 8 OUNCES OF WATER FOR OSTEOPOROSIS.   calcium-vitamin D (OSCAL WITH D) 500-5 MG-MCG tablet Take 1 tablet by mouth 2 (two) times daily.   Ensure (ENSURE) Take 237 mLs by mouth 2 (two) times daily between meals. VANILLA (Patient taking differently: Take 237 mLs by mouth 3 (three) times daily between  meals. VANILLA)   fexofenadine (ALLEGRA ALLERGY) 180 MG tablet Take 1 tablet (180 mg total) by mouth daily. (Patient taking differently: Take 180 mg by mouth daily as needed.)   fluticasone (FLONASE) 50 MCG/ACT nasal spray Place 2 sprays into both nostrils daily. (Patient taking differently: Place 2 sprays into both nostrils daily as needed.)   levothyroxine (SYNTHROID) 25 MCG tablet Take 1 tablet (25 mcg total) by mouth daily before breakfast.   Multiple Vitamins-Minerals (MULTIVITAMIN ADULTS 50+ PO) Take 1 tablet by mouth daily.   omega-3 fish oil (MAXEPA) 1000 MG CAPS capsule Take 2 capsules by mouth daily.    [DISCONTINUED] levothyroxine (SYNTHROID) 50 MCG tablet Take 1 tablet (50 mcg total) by mouth daily before breakfast.   No facility-administered encounter medications on file as of 07/30/2022.   ALLERGIES: No Known Allergies VACCINATION STATUS: Immunization History  Administered Date(s) Administered   Fluad Quad(high Dose 65+) 09/14/2019, 10/24/2020, 11/07/2021   Influenza Split 09/24/2012   Influenza Whole 10/04/2009, 09/29/2010, 09/17/2011   Influenza,inj,Quad PF,6+ Mos 10/29/2013, 10/11/2014, 09/20/2015, 08/20/2016, 09/17/2017, 09/18/2018   Moderna SARS-COV2 Booster Vaccination 07/04/2021, 11/21/2021   Moderna Sars-Covid-2 Vaccination 03/09/2020, 04/10/2020, 11/10/2020   Pneumococcal Conjugate-13 03/17/2015   Pneumococcal Polysaccharide-23 12/18/2016   Td 03/24/2010   Zoster, Live 09/20/2011    HPI  71 yr old female with medical hx as above.  She is following in this clinic for management of osteoporosis.  She is on Fosamax 70 mg p.o. weekly since 2015.  She has no complaints regarding her treatment for osteoporosis.  She underwent previsit DEXA scan which generally shows significant improvement from 2011-2021, in all areas scanned : spine, hips, and femuri . She has had lumbar disc prolapse for which she underwent surgery in 2002 and 2009.  Recently she was seen due to  abnormal thyroid function tests.  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  She was given RAI thyroid ablation which helped her symptoms resolve and patient presents with significant clinical improvement.  She is currently on levothyroxine 50 mcg p.o. daily before breakfast for RAI induced hypothyroidism.  Her previsit labs are consistent with slight over-replacement.    She returns with continued clinical improvement  .   See notes from previous visits.  She has gained 3 pounds since last visit which she is happy about.     She denies dysphagia, shortness of breath, nor voice change. She  has 3 grown children. she denies parathyroid, thyroid dysfunction.  She denies history of fragility fracture.  She remains on vitamin D and low-dose calcium supplement. Review of her medical records also indicates hyperlipidemia not on treatment.  Review of Systems Limited as  above.  Objective:    BP 112/64   Pulse 68   Ht 5' 2" (1.575 m)   Wt 112 lb 3.2 oz (50.9 kg)   BMI 20.52 kg/m   Wt Readings from Last 3 Encounters:  07/30/22 112 lb 3.2 oz (50.9 kg)  07/20/22 110 lb 1.3 oz (49.9 kg)  07/09/22 109 lb 2 oz (49.5 kg)    Physical Exam     Results for orders placed or performed in visit on 03/07/22  CBC  Result Value Ref Range   WBC 4.6 3.4 - 10.8 x10E3/uL   RBC 4.79 3.77 - 5.28 x10E6/uL   Hemoglobin 12.6 11.1 - 15.9 g/dL   Hematocrit 39.3 34.0 - 46.6 %   MCV 82 79 - 97 fL   MCH 26.3 (L) 26.6 - 33.0 pg   MCHC 32.1 31.5 - 35.7 g/dL   RDW 13.5 11.7 - 15.4 %   Platelets 240 150 - 450 x10E3/uL  CMP14+EGFR  Result Value Ref Range   Glucose 85 70 - 99 mg/dL   BUN 11 8 - 27 mg/dL   Creatinine, Ser 0.80 0.57 - 1.00 mg/dL   eGFR 79 >59 mL/min/1.73   BUN/Creatinine Ratio 14 12 - 28   Sodium 145 (H) 134 - 144 mmol/L   Potassium 4.8 3.5 - 5.2 mmol/L   Chloride 107 (H) 96 - 106 mmol/L   CO2 22 20 - 29 mmol/L    Calcium 9.8 8.7 - 10.3 mg/dL   Total Protein 7.6 6.0 - 8.5 g/dL   Albumin 5.0 (H) 3.8 - 4.8 g/dL   Globulin, Total 2.6 1.5 - 4.5 g/dL   Albumin/Globulin Ratio 1.9 1.2 - 2.2   Bilirubin Total 0.4 0.0 - 1.2 mg/dL   Alkaline Phosphatase 93 44 - 121 IU/L   AST 25 0 - 40 IU/L   ALT 20 0 - 32 IU/L  VITAMIN D 25 Hydroxy (Vit-D Deficiency, Fractures)  Result Value Ref Range   Vit D, 25-Hydroxy 50.4 30.0 - 100.0 ng/mL  Lipid panel  Result Value Ref Range   Cholesterol, Total 203 (H) 100 - 199 mg/dL   Triglycerides 88 0 - 149 mg/dL   HDL 67 >39 mg/dL   VLDL Cholesterol Cal 16 5 - 40 mg/dL   LDL Chol Calc (NIH) 120 (H) 0 - 99 mg/dL   Chol/HDL Ratio 3.0 0.0 - 4.4 ratio   Complete Blood Count (Most recent): Lab Results  Component Value Date   WBC 4.6 03/07/2022   HGB 12.6 03/07/2022   HCT 39.3 03/07/2022   MCV 82 03/07/2022   PLT 240 03/07/2022   Chemistry (most recent): Lab Results  Component Value Date   NA 140 07/20/2022   K 4.7 07/20/2022   CL 103 07/20/2022   CO2 22 07/20/2022   BUN 12 07/20/2022   CREATININE 0.73 07/20/2022   Diabetic Labs (most recent): Lab Results  Component Value Date   HGBA1C 5.5 12/11/2016   HGBA1C 5.6 12/12/2015   HGBA1C 5.6 06/21/2015   Lipid Panel     Component Value Date/Time   CHOL 203 (H) 03/07/2022 1008   TRIG 88 03/07/2022 1008   HDL 67 03/07/2022 1008   CHOLHDL 3.0 03/07/2022 1008   CHOLHDL 3.1 03/07/2020 0952   VLDL 17 04/17/2017 0958   LDLCALC 120 (H) 03/07/2022 1008   LDLCALC 105 (H) 03/07/2020 0952   LABVLDL 16 03/07/2022 1008     Assessment & Plan:  1.  RAI induced hypothyroidism She is status post RAI  thyroid ablation for hyper functioning nodule in the left lobe of her thyroid. -Her thyroid function tests are consistent with over replacement.  I discussed and lowered her levothyroxine back to 25 mcg p.o. daily before breakfast.    - We discussed about the correct intake of her thyroid hormone, on empty stomach at  fasting, with water, separated by at least 30 minutes from breakfast and other medications,  and separated by more than 4 hours from calcium, iron, multivitamins, acid reflux medications (PPIs). -Patient is made aware of the fact that thyroid hormone replacement is needed for life, dose to be adjusted by periodic monitoring of thyroid function tests.     2. 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  Previsit DEXA scan from May 17, 2020 showed continued progressive improvement compared to her last DEXA scan readings from 2011-2021 in spine, hips, and femuri. She is tolerating Fosamax, advised to continue Fosamax 70 mg weekly.   She is advised on side effects and precautions. She was started on Fosamax in 2015. -She has no recent fragility fracture.    This is  a good development for her since she has not lost significant bone density since she was started on Fosamax in 2015.  Her PTH is normal along with calcium and TFTs, ruling out secondary cause of osteoporosis. She is made aware of the possibility of treating her with Fosamax for 5 to 8 years before considering drug holiday.   Risk of untreated osteoporosis is discussed with her in detail.  Her next bone density is due in May 2023.   3) dyslipidemia: Not on treatment She is approached for dietary change. - she acknowledges that there is a room for improvement in her food and drink choices. - Suggestion is made for her to avoid simple carbohydrates  from her diet including Cakes, Sweet Desserts, Ice Cream, Soda (diet and regular), Sweet Tea, Candies, Chips, Cookies, Store Bought Juices, Alcohol , Artificial Sweeteners,  Coffee Creamer, and "Sugar-free" Products, Lemonade. This will help patient to have more stable blood glucose profile and potentially avoid unintended weight gain.  The following Lifestyle Medicine recommendations according to [facility]) were discussed  and and offered to patient and she  agrees to start the journey:  A. Whole Foods, Plant-Based Nutrition comprising of fruits and vegetables, plant-based proteins, whole-grain carbohydrates was discussed in detail with the patient.   A list for source of those nutrients were also provided to the patient.  Patient will use only water or unsweetened tea for hydration. B.  The need to stay away from risky substances including alcohol, smoking; obtaining 7 to 9 hours of restorative sleep, at least 150 minutes of moderate intensity exercise weekly, the importance of healthy social connections,  and stress management techniques were discussed.  4.  Vitamin D deficiency -She is now vitamin D replete at 50.4-  She is status post vitamin D2 50,000 units weekly.  She will continue to maintain with vitamin D3 600/200 mg p.o. daily.  I advised patient to maintain close follow up with their PCP for primary care needs.   I spent 32 minutes in the care of the patient today including review of labs from Thyroid Function, CMP, and other relevant labs ; imaging/biopsy records (current and previous including abstractions from other facilities); face-to-face time discussing  her lab results and symptoms, medications doses, her options of short and long term treatment based on the latest standards of  care / guidelines;   and documenting the encounter.  Megan Gibbs  participated in the discussions, expressed understanding, and voiced agreement with the above plans.  All questions were answered to her satisfaction. she is encouraged to contact clinic should she have any questions or concerns prior to her return visit.    Follow up plan: Return in about 4 months (around 11/29/2022) for Fasting Labs  in AM B4 8.  Glade Lloyd, MD Phone: [phone]  Fax: [phone]  -  This note was partially dictated with voice recognition software. Similar sounding words can be transcribed inadequately or may not  be corrected upon  review.  07/30/2022, 12:52 PM

## 2022-07-30 NOTE — Patient Instructions (Signed)
Megan Gibbs , Thank you for taking time to come for your Medicare Wellness Visit. I appreciate your ongoing commitment to your health goals. Please review the following plan we discussed and let me know if I can assist you in the future.   Screening recommendations/referrals: Colonoscopy: Completed Mammogram: Completed Bone Density: Completed Recommended yearly ophthalmology/optometry visit for glaucoma screening and checkup Recommended yearly dental visit for hygiene and checkup  Vaccinations: Influenza vaccine: Completed Pneumococcal vaccine: Completed Tdap vaccine: Due Shingles vaccine: Due    Advanced directives: Bring a copy in for your next appt  Conditions/risks identified: Falls  Next appointment: 1 year   Preventive Care 71 Years and Older, Female Preventive care refers to lifestyle choices and visits with your health care provider that can promote health and wellness. What does preventive care include? A yearly physical exam. This is also called an annual well check. Dental exams once or twice a year. Routine eye exams. Ask your health care provider how often you should have your eyes checked. Personal lifestyle choices, including: Daily care of your teeth and gums. Regular physical activity. Eating a healthy diet. Avoiding tobacco and drug use. Limiting alcohol use. Practicing safe sex. Taking low-dose aspirin every day. Taking vitamin and mineral supplements as recommended by your health care provider. What happens during an annual well check? The services and screenings done by your health care provider during your annual well check will depend on your age, overall health, lifestyle risk factors, and family history of disease. Counseling  Your health care provider may ask you questions about your: Alcohol use. Tobacco use. Drug use. Emotional well-being. Home and relationship well-being. Sexual activity. Eating habits. History of falls. Memory and ability  to understand (cognition). Work and work Statistician. Reproductive health. Screening  You may have the following tests or measurements: Height, weight, and BMI. Blood pressure. Lipid and cholesterol levels. These may be checked every 5 years, or more frequently if you are over 27 years old. Skin check. Lung cancer screening. You may have this screening every year starting at age 71 if you have a 30-pack-year history of smoking and currently smoke or have quit within the past 15 years. Fecal occult blood test (FOBT) of the stool. You may have this test every year starting at age 71. Flexible sigmoidoscopy or colonoscopy. You may have a sigmoidoscopy every 5 years or a colonoscopy every 10 years starting at age 42. Hepatitis C blood test. Hepatitis B blood test. Sexually transmitted disease (STD) testing. Diabetes screening. This is done by checking your blood sugar (glucose) after you have not eaten for a while (fasting). You may have this done every 1-3 years. Bone density scan. This is done to screen for osteoporosis. You may have this done starting at age 71. Mammogram. This may be done every 1-2 years. Talk to your health care provider about how often you should have regular mammograms. Talk with your health care provider about your test results, treatment options, and if necessary, the need for more tests. Vaccines  Your health care provider may recommend certain vaccines, such as: Influenza vaccine. This is recommended every year. Tetanus, diphtheria, and acellular pertussis (Tdap, Td) vaccine. You may need a Td booster every 10 years. Zoster vaccine. You may need this after age 71. Pneumococcal 13-valent conjugate (PCV13) vaccine. One dose is recommended after age 18. Pneumococcal polysaccharide (PPSV23) vaccine. One dose is recommended after age 71. Talk to your health care provider about which screenings and vaccines you need and how  often you need them. This information is not  intended to replace advice given to you by your health care provider. Make sure you discuss any questions you have with your health care provider. Document Released: 01/13/2016 Document Revised: 09/05/2016 Document Reviewed: 10/18/2015 Elsevier Interactive Patient Education  2017 [place] Prevention in the Home Falls can cause injuries. They can happen to people of all ages. There are many things you can do to make your home safe and to help prevent falls. What can I do on the outside of my home? Regularly fix the edges of walkways and driveways and fix any cracks. Remove anything that might make you trip as you walk through a door, such as a raised step or threshold. Trim any bushes or trees on the path to your home. Use bright outdoor lighting. Clear any walking paths of anything that might make someone trip, such as rocks or tools. Regularly check to see if handrails are loose or broken. Make sure that both sides of any steps have handrails. Any raised decks and porches should have guardrails on the edges. Have any leaves, snow, or ice cleared regularly. Use sand or salt on walking paths during winter. Clean up any spills in your garage right away. This includes oil or grease spills. What can I do in the bathroom? Use night lights. Install grab bars by the toilet and in the tub and shower. Do not use towel bars as grab bars. Use non-skid mats or decals in the tub or shower. If you need to sit down in the shower, use a plastic, non-slip stool. Keep the floor dry. Clean up any water that spills on the floor as soon as it happens. Remove soap buildup in the tub or shower regularly. Attach bath mats securely with double-sided non-slip rug tape. Do not have throw rugs and other things on the floor that can make you trip. What can I do in the bedroom? Use night lights. Make sure that you have a light by your bed that is easy to reach. Do not use any sheets or blankets that are  too big for your bed. They should not hang down onto the floor. Have a firm chair that has side arms. You can use this for support while you get dressed. Do not have throw rugs and other things on the floor that can make you trip. What can I do in the kitchen? Clean up any spills right away. Avoid walking on wet floors. Keep items that you use a lot in easy-to-reach places. If you need to reach something above you, use a strong step stool that has a grab bar. Keep electrical cords out of the way. Do not use floor polish or wax that makes floors slippery. If you must use wax, use non-skid floor wax. Do not have throw rugs and other things on the floor that can make you trip. What can I do with my stairs? Do not leave any items on the stairs. Make sure that there are handrails on both sides of the stairs and use them. Fix handrails that are broken or loose. Make sure that handrails are as long as the stairways. Check any carpeting to make sure that it is firmly attached to the stairs. Fix any carpet that is loose or worn. Avoid having throw rugs at the top or bottom of the stairs. If you do have throw rugs, attach them to the floor with carpet tape. Make sure that you have a  light switch at the top of the stairs and the bottom of the stairs. If you do not have them, ask someone to add them for you. What else can I do to help prevent falls? Wear shoes that: Do not have high heels. Have rubber bottoms. Are comfortable and fit you well. Are closed at the toe. Do not wear sandals. If you use a stepladder: Make sure that it is fully opened. Do not climb a closed stepladder. Make sure that both sides of the stepladder are locked into place. Ask someone to hold it for you, if possible. Clearly mark and make sure that you can see: Any grab bars or handrails. First and last steps. Where the edge of each step is. Use tools that help you move around (mobility aids) if they are needed. These  include: Canes. Walkers. Scooters. Crutches. Turn on the lights when you go into a dark area. Replace any light bulbs as soon as they burn out. Set up your furniture so you have a clear path. Avoid moving your furniture around. If any of your floors are uneven, fix them. If there are any pets around you, be aware of where they are. Review your medicines with your doctor. Some medicines can make you feel dizzy. This can increase your chance of falling. Ask your doctor what other things that you can do to help prevent falls. This information is not intended to replace advice given to you by your health care provider. Make sure you discuss any questions you have with your health care provider. Document Released: 10/13/2009 Document Revised: 05/24/2016 Document Reviewed: 01/21/2015 Elsevier Interactive Patient Education  2017 [employer].

## 2022-07-30 NOTE — Progress Notes (Signed)
Subjective:   Megan Gibbs is a 70 y.o. female who presents for Medicare Annual (Subsequent) preventive examination.  I connected with  Balinda Quails on 07/30/22 by a audio enabled telemedicine application and verified that I am speaking with the correct person using two identifiers.  Patient Location: Home  Provider Location: Office/Clinic  I discussed the limitations of evaluation and management by telemedicine. The patient expressed understanding and agreed to proceed.  Review of Systems     [name] , Thank you for taking time to come for your Medicare Wellness Visit. I appreciate your ongoing commitment to your health goals. Please review the following plan we discussed and let me know if I can assist you in the future.   These are the goals we discussed:  Goals      DIET - INCREASE WATER INTAKE     Exercise 3x per week (30 min per time)     Continue to walk 30 mins on treadmill 5 days a week         This is a list of the screening recommended for you and due dates:  Health Maintenance  Topic Date Due   Zoster (Shingles) Vaccine (1 of 2) Never done   Tetanus Vaccine  03/24/2020   COVID-19 Vaccine (4 - Booster for Moderna series) 01/16/2022   Flu Shot  07/31/2022   Mammogram  12/29/2023   Colon Cancer Screening  04/19/2024   Pneumonia Vaccine  Completed   DEXA scan (bone density measurement)  Completed   Hepatitis C Screening: USPSTF Recommendation to screen - Ages 62-79 yo.  Completed   HPV Vaccine  Aged Out          Objective:    There were no vitals filed for this visit. There is no height or weight on file to calculate BMI.     07/09/2022   10:43 AM 06/08/2022    2:26 PM 07/20/2021    2:45 PM 07/18/2020   10:11 AM 06/18/2018    9:33 AM 05/09/2017    9:45 AM 12/30/2016    3:28 PM  Advanced Directives  Does Patient Have a Medical Advance Directive? No No No No No No No  Does patient want to make changes to medical advance directive?    Yes (ED - Information  included in AVS)     Would patient like information on creating a medical advance directive? No - Patient declined No - Patient declined No - Patient declined  Yes (MAU/Ambulatory/Procedural Areas - Information given) Yes (MAU/Ambulatory/Procedural Areas - Information given) No - Patient declined    Current Medications (verified) Outpatient Encounter Medications as of 07/30/2022  Medication Sig   alendronate (FOSAMAX) 70 MG tablet TAKE 1 TABLET ONCE A WEEK 30 MINUTES BEFORE BREAKFAST WITH 8 OUNCES OF WATER FOR OSTEOPOROSIS.   calcium-vitamin D (OSCAL WITH D) 500-5 MG-MCG tablet Take 1 tablet by mouth 2 (two) times daily.   Ensure (ENSURE) Take 237 mLs by mouth 2 (two) times daily between meals. VANILLA   fexofenadine (ALLEGRA ALLERGY) 180 MG tablet Take 1 tablet (180 mg total) by mouth daily. (Patient taking differently: Take 180 mg by mouth daily as needed.)   fluticasone (FLONASE) 50 MCG/ACT nasal spray Place 2 sprays into both nostrils daily. (Patient taking differently: Place 2 sprays into both nostrils daily as needed.)   levothyroxine (SYNTHROID) 50 MCG tablet Take 1 tablet (50 mcg total) by mouth daily before breakfast.   Multiple Vitamins-Minerals (MULTIVITAMIN ADULTS 50+ PO) Take 1  tablet by mouth daily.   omega-3 fish oil (MAXEPA) 1000 MG CAPS capsule Take 2 capsules by mouth daily.    No facility-administered encounter medications on file as of 07/30/2022.    Allergies (verified) Patient has no known allergies.   History: Past Medical History:  Diagnosis Date   Allergic rhinitis    Anemia    Anxiety    Back pain    Depression    Hypokalemia    Hypothyroidism    Osteoporosis    Prediabetes    Seasonal allergies    Seasonal allergies    Vertigo    Wears glasses    Past Surgical History:  Procedure Laterality Date   BACK SURGERY  1992 & 2009   Dr. Joya Salm    BREAST BIOPSY     BREAST EXCISIONAL BIOPSY     BREAST LUMPECTOMY WITH RADIOACTIVE SEED LOCALIZATION Right  12/01/2014   Procedure: RIGHT BREAST LUMPECTOMY WITH RADIOACTIVE SEED LOCALIZATION;  Surgeon: Fanny Skates, MD;  Location: [facility];  Service: General;  Laterality: Right;   CATARACT EXTRACTION W/PHACO Right 06/15/2022   Procedure: CATARACT EXTRACTION PHACO AND INTRAOCULAR LENS PLACEMENT ([place]);  Surgeon: Baruch Goldmann, MD;  Location: [facility];  Service: Ophthalmology;  Laterality: Right;  CDE: 10.49   CATARACT EXTRACTION W/PHACO Left 07/09/2022   Procedure: CATARACT EXTRACTION PHACO AND INTRAOCULAR LENS PLACEMENT ([facility]);  Surgeon: Baruch Goldmann, MD;  Location: [facility];  Service: Ophthalmology;  Laterality: Left;  CDE: 9.15   COLONOSCOPY N/A 04/19/2014   Procedure: COLONOSCOPY;  Surgeon: Daneil Dolin, MD;  Location[facility];  Service: Endoscopy;  Laterality: N/A;  9:30 AM   DILATION AND CURETTAGE OF UTERUS     TUBAL LIGATION     1976   Family History  Problem Relation Age of Onset   Diabetes Mother    Hypertension Mother    Asthma Sister    Thyroid disease Sister    COPD Sister    Anemia Sister    Diabetes Sister    Hypertension Sister    Diabetes Sister    Schizophrenia Father    Drug abuse Brother    Alcohol abuse Brother    Sleep apnea Grandchild    ADD / ADHD Grandchild    Seizures Grandchild    Breast cancer Sister 15   Thyroid disease Sister    Hypertension Brother    Anxiety disorder Maternal Aunt    Hypertension Daughter    BRCA 1/2 Daughter    Anemia Daughter    Hypertension Daughter    Asthma Daughter    Colon cancer Neg Hx    Social History   Socioeconomic History   Marital status: Divorced    Spouse name: Not on file   Number of children: 3   Years of education: Not on file   Highest education level: Not on file  Occupational History   Occupation: disabled     Employer: UNEMPLOYED  Tobacco Use   Smoking status: Never   Smokeless tobacco: Never  Vaping Use   Vaping Use: Never used  Substance and Sexual Activity   Alcohol use: No    Drug use: No   Sexual activity: Yes    Birth control/protection: Post-menopausal  Other Topics Concern   Not on file  Social History Narrative   DOING A LOT [employer].    Social Determinants of Health   Financial Resource Strain: Low Risk  (07/20/2021)   Overall Financial Resource Strain (CARDIA)  Difficulty of Paying Living Expenses: Not hard at all  Food Insecurity: No Food Insecurity (07/20/2021)   Hunger Vital Sign    Worried About Running Out of Food in the Last Year: Never true    Ran Out of Food in the Last Year: Never true  Transportation Needs: No Transportation Needs (07/20/2021)   PRAPARE - Hydrologist (Medical): No    Lack of Transportation (Non-Medical): No  Physical Activity: Inactive (07/20/2021)   Exercise Vital Sign    Days of Exercise per Week: 0 days    Minutes of Exercise per Session: 0 min  Stress: No Stress Concern Present (07/20/2021)   [place]    Feeling of Stress : Not at all  Social Connections: Moderately Isolated (07/20/2021)   Social Connection and Isolation Panel [NHANES]    Frequency of Communication with Friends and Family: More than three times a week    Frequency of Social Gatherings with Friends and Family: More than three times a week    Attends Religious Services: More than 4 times per year    Active Member of [employer] or Organizations: No    Attends Archivist Meetings: Never    Marital Status: Divorced    Tobacco Counseling Counseling given: Not Answered   Clinical Intake:                 Diabetic? No         Activities of Daily Living    07/09/2022   10:57 AM 06/08/2022    2:29 PM  In your present state of health, do you have any difficulty performing the following activities:  Hearing? 0   Vision? 0   Difficulty concentrating or making decisions? 0   Walking or climbing stairs? 0    Dressing or bathing? 0   Doing errands, shopping?  0    Patient Care Team: Fayrene Helper, MD as PCP - General Rourk, Cristopher Estimable, MD as Consulting Physician (Gastroenterology) Lay, Bobbye Charleston, MD (Psychiatry) Cassandria Anger, MD as Consulting Physician (Endocrinology) Madelin Headings, DO as Consulting Physician (Optometry)  Indicate any recent Medical Services you may have received from other than [facility] providers in the past year (date may be approximate).     Assessment:   This is a routine wellness examination for [facility].  Hearing/Vision screen No results found.  Dietary issues and exercise activities discussed:     Goals Addressed   None   Depression Screen    07/20/2022   10:46 AM 03/07/2022    9:25 AM 11/07/2021    8:02 AM 08/03/2021   10:05 AM 07/20/2021    2:42 PM 05/03/2021   11:13 AM 03/21/2021    1:37 PM  PHQ 2/9 Scores  PHQ - 2 Score 0 0 0 0 0 0 0  PHQ- 9 Score   0        Fall Risk    07/20/2022   10:46 AM 03/07/2022    9:24 AM 11/07/2021    8:02 AM 08/03/2021   10:05 AM 07/20/2021    2:45 PM  Fall Risk   Falls in the past year? 0 0 0 0 0  Number falls in past yr: 0 0  0 0  Injury with Fall? 0 0  0 0  Risk for fall due to : No Fall Risks No Fall Risks   No Fall Risks  Follow up Falls evaluation completed Falls evaluation  completed   Falls evaluation completed    FALL RISK PREVENTION PERTAINING TO THE HOME:  Any stairs in or around the home? Yes  If so, are there any without handrails? No  Home free of loose throw rugs in walkways, pet beds, electrical cords, etc? Yes  Adequate lighting in your home to reduce risk of falls? Yes   ASSISTIVE DEVICES UTILIZED TO PREVENT FALLS:  Life alert? No  Use of a cane, Megan or w/c? No  Grab bars in the bathroom? No  Shower chair or bench in shower? No  Elevated toilet seat or a handicapped toilet? No    Cognitive Function:        07/20/2021    2:51 PM 07/20/2021    2:46 PM 07/18/2020   10:15 AM  06/24/2019    9:45 AM 06/18/2018    9:36 AM  6CIT Screen  What Year? 0 points 0 points 0 points 0 points 0 points  What month? 0 points 0 points 0 points 0 points 0 points  What time? 0 points 0 points 0 points 0 points 0 points  Count back from 20 0 points 0 points 0 points 0 points 0 points  Months in reverse 0 points 0 points 0 points 0 points 0 points  Repeat phrase 0 points 0 points 0 points 2 points 2 points  Total Score 0 points 0 points 0 points 2 points 2 points    Immunizations Immunization History  Administered Date(s) Administered   Fluad Quad(high Dose 65+) 09/14/2019, 10/24/2020, 11/07/2021   Influenza Split 09/24/2012   Influenza Whole 10/04/2009, 09/29/2010, 09/17/2011   Influenza,inj,Quad PF,6+ Mos 10/29/2013, 10/11/2014, 09/20/2015, 08/20/2016, 09/17/2017, 09/18/2018   Moderna SARS-COV2 Booster Vaccination 07/04/2021, 11/21/2021   Moderna Sars-Covid-2 Vaccination 03/09/2020, 04/10/2020, 11/10/2020   Pneumococcal Conjugate-13 03/17/2015   Pneumococcal Polysaccharide-23 12/18/2016   Td 03/24/2010   Zoster, Live 09/20/2011    TDAP status: Due, Education has been provided regarding the importance of this vaccine. Advised may receive this vaccine at local pharmacy or Health Dept. Aware to provide a copy of the vaccination record if obtained from local pharmacy or Health Dept. Verbalized acceptance and understanding.  Flu Vaccine status: Up to date  Pneumococcal vaccine status: Up to date  Covid-19 vaccine status: Completed vaccines  Qualifies for Shingles Vaccine? Yes   Zostavax completed Yes   Shingrix Completed?: No.    Education has been provided regarding the importance of this vaccine. Patient has been advised to call insurance company to determine out of pocket expense if they have not yet received this vaccine. Advised may also receive vaccine at local pharmacy or Health Dept. Verbalized acceptance and understanding.  Screening Tests Health Maintenance   Topic Date Due   Zoster Vaccines- Shingrix (1 of 2) Never done   TETANUS/TDAP  03/24/2020   COVID-19 Vaccine (4 - Booster for Moderna series) 01/16/2022   INFLUENZA VACCINE  07/31/2022   MAMMOGRAM  12/29/2023   COLONOSCOPY (Pts 45-45yr Insurance coverage will need to be confirmed)  04/19/2024   Pneumonia Vaccine 71 Years old  Completed   DEXA SCAN  Completed   Hepatitis C Screening  Completed   HPV VACCINES  Aged Out    Health Maintenance  Health Maintenance Due  Topic Date Due   Zoster Vaccines- Shingrix (1 of 2) Never done   TETANUS/TDAP  03/24/2020   COVID-19 Vaccine (4 - Booster for Moderna series) 01/16/2022    Colorectal cancer screening: Type of screening: Colonoscopy. Completed 04/19/2014. Repeat every 10  years  Mammogram status: Completed 12/28/2021. Repeat every year  Bone Density status: Completed 12/28/2021. Results reflect: Bone density results: OSTEOPOROSIS. Repeat every   years.  Lung Cancer Screening: (Low Dose CT Chest recommended if Age 34-80 years, 30 pack-year currently smoking OR have quit w/in 15years.) does not qualify.    Additional Screening:  Hepatitis C Screening: does qualify; Completed 12/12/2015  Vision Screening: Recommended annual ophthalmology exams for early detection of glaucoma and other disorders of the eye. Is the patient up to date with their annual eye exam?  Yes  Who is the provider or what is the name of the office in which the patient attends annual eye exams? [facility]   Dental Screening: Recommended annual dental exams for proper oral hygiene  Community Resource Referral / Chronic Care Management: CRR required this visit?  No   CCM required this visit?  No      Plan:     I have personally reviewed and noted the following in the patient's chart:   Medical and social history Use of alcohol, tobacco or illicit drugs  Current medications and supplements including opioid prescriptions.  Functional ability and  status Nutritional status Physical activity Advanced directives List of other physicians Hospitalizations, surgeries, and ER visits in previous 12 months Vitals Screenings to include cognitive, depression, and falls Referrals and appointments  In addition, I have reviewed and discussed with patient certain preventive protocols, quality metrics, and best practice recommendations. A written personalized care plan for preventive services as well as general preventive health recommendations were provided to patient.     Johny Drilling, IXL   07/30/2022   Nurse Notes:  Ms. Alas , Thank you for taking time to come for your Medicare Wellness Visit. I appreciate your ongoing commitment to your health goals. Please review the following plan we discussed and let me know if I can assist you in the future.   These are the goals we discussed:  Goals      DIET - INCREASE WATER INTAKE     Exercise 3x per week (30 min per time)     Continue to walk 30 mins on treadmill 5 days a week         This is a list of the screening recommended for you and due dates:  Health Maintenance  Topic Date Due   Zoster (Shingles) Vaccine (1 of 2) Never done   Tetanus Vaccine  03/24/2020   COVID-19 Vaccine (4 - Booster for Moderna series) 01/16/2022   Flu Shot  07/31/2022   Mammogram  12/29/2023   Colon Cancer Screening  04/19/2024   Pneumonia Vaccine  Completed   DEXA scan (bone density measurement)  Completed   Hepatitis C Screening: USPSTF Recommendation to screen - Ages 47-79 yo.  Completed   HPV Vaccine  Aged Out

## 2022-08-13 DIAGNOSIS — Z0100 Encounter for examination of eyes and vision without abnormal findings: Secondary | ICD-10-CM | POA: Diagnosis not present

## 2022-08-29 ENCOUNTER — Other Ambulatory Visit: Payer: Self-pay | Admitting: Family Medicine

## 2022-09-11 ENCOUNTER — Ambulatory Visit (INDEPENDENT_AMBULATORY_CARE_PROVIDER_SITE_OTHER): Payer: No Typology Code available for payment source | Admitting: Family Medicine

## 2022-09-11 ENCOUNTER — Encounter: Payer: Self-pay | Admitting: Family Medicine

## 2022-09-11 VITALS — BP 121/69 | HR 65 | Ht 62.0 in | Wt 108.1 lb

## 2022-09-11 DIAGNOSIS — F4321 Adjustment disorder with depressed mood: Secondary | ICD-10-CM | POA: Diagnosis not present

## 2022-09-11 DIAGNOSIS — E890 Postprocedural hypothyroidism: Secondary | ICD-10-CM | POA: Diagnosis not present

## 2022-09-11 DIAGNOSIS — E782 Mixed hyperlipidemia: Secondary | ICD-10-CM

## 2022-09-11 DIAGNOSIS — Z23 Encounter for immunization: Secondary | ICD-10-CM

## 2022-09-11 DIAGNOSIS — R69 Illness, unspecified: Secondary | ICD-10-CM | POA: Diagnosis not present

## 2022-09-11 NOTE — Assessment & Plan Note (Signed)
Appropriate grief due to recent losses, encouraged her to verbalize her feelings which are appropriate at this time

## 2022-09-11 NOTE — Assessment & Plan Note (Signed)
Overcorrected, managd by endo, has close f/u

## 2022-09-11 NOTE — Assessment & Plan Note (Signed)
Hyperlipidemia:Low fat diet discussed and encouraged.   Lipid Panel  Lab Results  Component Value Date   CHOL 203 (H) 03/07/2022   HDL 67 03/07/2022   LDLCALC 120 (H) 03/07/2022   TRIG 88 03/07/2022   CHOLHDL 3.0 03/07/2022     Updated lab needed at/ before next visit.

## 2022-09-11 NOTE — Progress Notes (Signed)
   Megan Gibbs     MRN: [id number]      DOB: 04-Feb-1951   HPI Megan Gibbs is here for follow up Currently grieving 2 recent losses, doing better , at one time was unable to sleep but is doing better Thyroid was over coreectred recently and she has close f/u has had  problems with weight loss had been drinking 3 ensure daily, no longer has the source and is wanting to cut back to 1, citing blood suagr and cholesterol as issues as well  ROS Denies recent fever or chills. Denies sinus pressure, nasal congestion, ear pain or sore throat. Denies chest congestion, productive cough or wheezing. Denies chest pains, palpitations and leg swelling Denies abdominal pain, nausea, vomiting,diarrhea or constipation.   Denies dysuria, frequency, hesitancy or incontinence. Denies joint pain, swelling and limitation in mobility. Denies headaches, seizures, numbness, or tingling.  Denies skin break down or rash.   PE  BP 121/69 (BP Location: Left Arm, Patient Position: Sitting, Cuff Size: Normal)   Pulse 65   Ht '5\' 2"'$  (1.575 m)   Wt 108 lb 1.9 oz (49 kg)   SpO2 95%   BMI 19.78 kg/m   Patient alert and oriented and in no cardiopulmonary distress.  HEENT: No facial asymmetry, EOMI,     Neck supple .  Chest: Clear to auscultation bilaterally.  CVS: S1, S2 no murmurs, no S3.Regular rate.  ABD: Soft non tender.   Ext: No edema  MS: Adequate ROM spine, shoulders, hips and knees.  Skin: Intact, no ulcerations or rash noted.  Psych: Good eye contact, normal affect. Memory intact not anxious or depressed appearing.  CNS: CN 2-12 intact, power,  normal throughout.no focal deficits noted.   Assessment & Plan  Grief Appropriate grief due to recent losses, encouraged her to verbalize her feelings which are appropriate at this time  Mixed hyperlipidemia Hyperlipidemia:Low fat diet discussed and encouraged.   Lipid Panel  Lab Results  Component Value Date   CHOL 203 (H) 03/07/2022   HDL 67  03/07/2022   LDLCALC 120 (H) 03/07/2022   TRIG 88 03/07/2022   CHOLHDL 3.0 03/07/2022     Updated lab needed at/ before next visit.   Hypothyroidism following radioiodine therapy Overcorrected, managd by endo, has close f/u

## 2022-09-11 NOTE — Patient Instructions (Signed)
Keep appt for annual exam as scheduled   Flu vaccine today  Please follow low fat diet  I am sorry about your recent losses, thankful that ypour sleep and anxiety level are improving  Since you reprot poor appetite , I recommend one ensure daily  Thanks for choosing [facility], we consider it a privelige to serve you.

## 2022-09-12 DIAGNOSIS — R69 Illness, unspecified: Secondary | ICD-10-CM | POA: Diagnosis not present

## 2022-09-21 ENCOUNTER — Other Ambulatory Visit: Payer: Self-pay | Admitting: Family Medicine

## 2022-10-02 DIAGNOSIS — E039 Hypothyroidism, unspecified: Secondary | ICD-10-CM | POA: Diagnosis not present

## 2022-10-02 DIAGNOSIS — Z008 Encounter for other general examination: Secondary | ICD-10-CM | POA: Diagnosis not present

## 2022-10-02 DIAGNOSIS — Z681 Body mass index (BMI) 19 or less, adult: Secondary | ICD-10-CM | POA: Diagnosis not present

## 2022-10-02 DIAGNOSIS — M810 Age-related osteoporosis without current pathological fracture: Secondary | ICD-10-CM | POA: Diagnosis not present

## 2022-10-02 DIAGNOSIS — N182 Chronic kidney disease, stage 2 (mild): Secondary | ICD-10-CM | POA: Diagnosis not present

## 2022-10-24 ENCOUNTER — Other Ambulatory Visit: Payer: Self-pay | Admitting: Family Medicine

## 2022-11-20 ENCOUNTER — Ambulatory Visit (INDEPENDENT_AMBULATORY_CARE_PROVIDER_SITE_OTHER): Payer: No Typology Code available for payment source | Admitting: Family Medicine

## 2022-11-20 ENCOUNTER — Encounter: Payer: Self-pay | Admitting: Family Medicine

## 2022-11-20 VITALS — BP 121/70 | HR 71 | Ht 62.0 in | Wt 107.1 lb

## 2022-11-20 DIAGNOSIS — E782 Mixed hyperlipidemia: Secondary | ICD-10-CM | POA: Diagnosis not present

## 2022-11-20 DIAGNOSIS — E559 Vitamin D deficiency, unspecified: Secondary | ICD-10-CM

## 2022-11-20 DIAGNOSIS — Z0000 Encounter for general adult medical examination without abnormal findings: Secondary | ICD-10-CM | POA: Diagnosis not present

## 2022-11-20 DIAGNOSIS — E890 Postprocedural hypothyroidism: Secondary | ICD-10-CM | POA: Diagnosis not present

## 2022-11-20 DIAGNOSIS — R69 Illness, unspecified: Secondary | ICD-10-CM | POA: Diagnosis not present

## 2022-11-20 MED ORDER — MELATONIN 3 MG PO TABS
3.0000 mg | ORAL_TABLET | Freq: Every evening | ORAL | 3 refills | Status: DC | PRN
Start: 2022-11-20 — End: 2023-06-26

## 2022-11-20 NOTE — Patient Instructions (Addendum)
F/U in 5 months, call if you need me before  Fasting CBC, lipid, cmp and EGFr, and vit D 5 days before next visit  It is important that you exercise regularly at least 30 minutes 5 times a week. If you develop chest pain, have severe difficulty breathing, or feel very tired, stop exercising immediately and seek medical attention   Thanks for choosing [facility], we consider it a privelige to serve you.   Best for Season and 2-024!

## 2022-11-20 NOTE — Assessment & Plan Note (Addendum)
Annual exam as documented. . Immunization and cancer screening needs are specifically addressed at this visit.  

## 2022-11-20 NOTE — Progress Notes (Signed)
    AZULA ZAPPIA     MRN: [id number]      DOB: [birth year]  HPI: Patient is in for annual physical exam c/o difficulty sleeping waking around 3 am and having problems falling back to sleep, I recommend melatonin  Recent labs,  are reviewed. Immunization is reviewed , and  still thinking about shingrix   PE: BP 121/70   Pulse 71   Ht '5\' 2"'$  (1.575 m)   Wt 107 lb 1.9 oz (48.6 kg)   SpO2 98%   BMI 19.59 kg/m  Pleasant  female, alert and oriented x 3, in no cardio-pulmonary distress. Afebrile. HEENT No facial trauma or asymetry. Sinuses non tender.  Extra occullar muscles intact.. External ears normal, . Neck: supple, no adenopathy,JVD or thyromegaly.No bruits.  Chest: Clear to ascultation bilaterally.No crackles or wheezes. Non tender to palpation  Cardiovascular system; Heart sounds normal,  S1 and  S2 ,no S3.  No murmur, or thrill. Apical beat not displaced Peripheral pulses normal.  Abdomen: Soft, non tender, no organomegaly or masses. No bruits. Bowel sounds normal. No guarding, tenderness or rebound.   Musculoskeletal exam: Full ROM of spine, hips , shoulders and knees. No deformity ,swelling or crepitus noted. No muscle wasting or atrophy.   Neurologic: Cranial nerves 2 to 12 intact. Power, tone ,sensation and reflexes normal throughout. No disturbance in gait. No tremor.  Skin: Intact, no ulceration, erythema , scaling or rash noted. Pigmentation normal throughout  Psych; Normal mood and affect. Judgement and concentration normal   Assessment & Plan:  Annual visit for general adult medical examination without abnormal findings Annual exam as documented. Immunization and cancer screening needs are specifically addressed at this visit.

## 2022-11-21 DIAGNOSIS — R69 Illness, unspecified: Secondary | ICD-10-CM | POA: Diagnosis not present

## 2022-11-21 LAB — LIPID PANEL
Chol/HDL Ratio: 3.1 ratio (ref 0.0–4.4)
Cholesterol, Total: 202 mg/dL — ABNORMAL HIGH (ref 100–199)
HDL: 66 mg/dL (ref >39)
LDL Chol Calc (NIH): 125 mg/dL — ABNORMAL HIGH (ref 0–99)
Triglycerides: 62 mg/dL (ref 0–149)
VLDL Cholesterol Cal: 11 mg/dL (ref 5–40)

## 2022-11-21 LAB — TSH: TSH: 2.71 u[IU]/mL (ref 0.450–4.500)

## 2022-11-21 LAB — T4, FREE: Free T4: 1.15 ng/dL (ref 0.82–1.77)

## 2022-11-29 ENCOUNTER — Encounter: Payer: Self-pay | Admitting: "Endocrinology

## 2022-11-29 ENCOUNTER — Ambulatory Visit (INDEPENDENT_AMBULATORY_CARE_PROVIDER_SITE_OTHER): Payer: No Typology Code available for payment source | Admitting: "Endocrinology

## 2022-11-29 VITALS — BP 102/66 | HR 68 | Ht 62.0 in | Wt 108.8 lb

## 2022-11-29 DIAGNOSIS — M810 Age-related osteoporosis without current pathological fracture: Secondary | ICD-10-CM | POA: Diagnosis not present

## 2022-11-29 DIAGNOSIS — E782 Mixed hyperlipidemia: Secondary | ICD-10-CM | POA: Diagnosis not present

## 2022-11-29 DIAGNOSIS — Z5320 Procedure and treatment not carried out because of patient's decision for unspecified reasons: Secondary | ICD-10-CM | POA: Diagnosis not present

## 2022-11-29 DIAGNOSIS — E559 Vitamin D deficiency, unspecified: Secondary | ICD-10-CM

## 2022-11-29 DIAGNOSIS — E890 Postprocedural hypothyroidism: Secondary | ICD-10-CM

## 2022-11-29 DIAGNOSIS — R69 Illness, unspecified: Secondary | ICD-10-CM | POA: Diagnosis not present

## 2022-11-29 MED ORDER — ALENDRONATE SODIUM 70 MG PO TABS: ORAL_TABLET | ORAL | 3 refills | Status: DC

## 2022-11-29 NOTE — Progress Notes (Signed)
11/29/2022     Endocrinology follow-up note   Subjective:    Patient ID: Megan Gibbs, female    DOB: August 31, 1951,    Past Medical History:  Diagnosis Date   Allergic rhinitis    Anemia    Anxiety    Back pain    Depression    Hypokalemia    Hypothyroidism    Osteoporosis    Prediabetes    Seasonal allergies    Seasonal allergies    Vertigo    Wears glasses    Past Surgical History:  Procedure Laterality Date   BACK SURGERY  1992 & 2009   Dr. Joya Salm    BREAST BIOPSY     BREAST EXCISIONAL BIOPSY     BREAST LUMPECTOMY WITH RADIOACTIVE SEED LOCALIZATION Right 12/01/2014   Procedure: RIGHT BREAST LUMPECTOMY WITH RADIOACTIVE SEED LOCALIZATION;  Surgeon: Fanny Skates, MD;  Location: [place];  Service: General;  Laterality: Right;   CATARACT EXTRACTION W/PHACO Right 06/15/2022   Procedure: CATARACT EXTRACTION PHACO AND INTRAOCULAR LENS PLACEMENT ([place]);  Surgeon: Baruch Goldmann, MD;  Location: [facility];  Service: Ophthalmology;  Laterality: Right;  CDE: 10.49   CATARACT EXTRACTION W/PHACO Left 07/09/2022   Procedure: CATARACT EXTRACTION PHACO AND INTRAOCULAR LENS PLACEMENT ([facility]);  Surgeon: Baruch Goldmann, MD;  Location: [facility];  Service: Ophthalmology;  Laterality: Left;  CDE: 9.15   COLONOSCOPY N/A 04/19/2014   Procedure: COLONOSCOPY;  Surgeon: Daneil Dolin, MD;  Location: [facility];  Service: Endoscopy;  Laterality: N/A;  9:30 AM   DILATION AND CURETTAGE OF UTERUS     TUBAL LIGATION     1976   Social History   Socioeconomic History   Marital status: Divorced    Spouse name: Not on file   Number of children: 3   Years of education: Not on file   Highest education level: Not on file  Occupational History   Occupation: disabled     Employer: UNEMPLOYED  Tobacco Use   Smoking status: Never   Smokeless tobacco: Never  Vaping Use   Vaping Use: Never used  Substance and Sexual Activity   Alcohol use: No   Drug use: No   Sexual activity: Yes     Birth control/protection: Post-menopausal  Other Topics Concern   Not on file  Social History Narrative   DOING A LOT [place].    Social Determinants of Health   Financial Resource Strain: Low Risk  (07/30/2022)   Overall Financial Resource Strain (CARDIA)    Difficulty of Paying Living Expenses: Not hard at all  Food Insecurity: No Food Insecurity (07/30/2022)   Hunger Vital Sign    Worried About Running Out of Food in the Last Year: Never true    Ran Out of Food in the Last Year: Never true  Transportation Needs: No Transportation Needs (07/30/2022)   PRAPARE - Hydrologist (Medical): No    Lack of Transportation (Non-Medical): No  Physical Activity: Inactive (07/30/2022)   Exercise Vital Sign    Days of Exercise per Week: 0 days    Minutes of Exercise per Session: 0 min  Stress: No Stress Concern Present (07/30/2022)   [place]    Feeling of Stress : Not at all  Social Connections: Moderately Isolated (07/30/2022)   Social Connection and Isolation Panel [NHANES]    Frequency of Communication with Friends and Family: More than three times a  week    Frequency of Social Gatherings with Friends and Family: More than three times a week    Attends Religious Services: More than 4 times per year    Active Member of [employer] or Organizations: No    Attends Archivist Meetings: Never    Marital Status: Divorced   Outpatient Encounter Medications as of 11/29/2022  Medication Sig   alendronate (FOSAMAX) 70 MG tablet Take with a full glass of water on an empty stomach.   calcium-vitamin D (OSCAL WITH D) 500-5 MG-MCG tablet Take 1 tablet by mouth 2 (two) times daily.   Ensure (ENSURE) Take 237 mLs by mouth 2 (two) times daily between meals. VANILLA (Patient taking differently: Take 237 mLs by mouth 3 (three) times daily between meals. VANILLA)   fexofenadine (ALLEGRA  ALLERGY) 180 MG tablet Take 1 tablet (180 mg total) by mouth daily. (Patient taking differently: Take 180 mg by mouth daily as needed.)   fluticasone (FLONASE) 50 MCG/ACT nasal spray Place 2 sprays into both nostrils daily. (Patient taking differently: Place 2 sprays into both nostrils daily as needed.)   levothyroxine (SYNTHROID) 25 MCG tablet Take 1 tablet (25 mcg total) by mouth daily before breakfast.   melatonin 3 MG TABS tablet Take 1 tablet (3 mg total) by mouth at bedtime as needed.   Multiple Vitamins-Minerals (MULTIVITAMIN ADULTS 50+ PO) Take 1 tablet by mouth daily.   omega-3 fish oil (MAXEPA) 1000 MG CAPS capsule Take 2 capsules by mouth daily.    [DISCONTINUED] alendronate (FOSAMAX) 70 MG tablet TAKE 1 TABLET ONCE A WEEK 30 MINUTES BEFORE BREAKFAST WITH 8 OUNCES OF WATER FOR OSTEOPOROSIS.   No facility-administered encounter medications on file as of 11/29/2022.   ALLERGIES: No Known Allergies VACCINATION STATUS: Immunization History  Administered Date(s) Administered   Fluad Quad(high Dose 65+) 09/14/2019, 10/24/2020, 11/07/2021, 09/11/2022   Influenza Split 09/24/2012   Influenza Whole 10/04/2009, 09/29/2010, 09/17/2011   Influenza,inj,Quad PF,6+ Mos 10/29/2013, 10/11/2014, 09/20/2015, 08/20/2016, 09/17/2017, 09/18/2018   Moderna Covid-19 Vaccine Bivalent Booster 29yr & up 11/13/2022   Moderna SARS-COV2 Booster Vaccination 07/04/2021, 11/21/2021   Moderna Sars-Covid-2 Vaccination 03/09/2020, 04/10/2020, 11/10/2020   Pneumococcal Conjugate-13 03/17/2015   Pneumococcal Polysaccharide-23 12/18/2016   Td 03/24/2010   Zoster, Live 09/20/2011    HPI  71yr old female with medical hx as above.  She is following in this clinic for management of osteoporosis.  She is on Fosamax 70 mg p.o. weekly since 2015.  She has no complaints regarding her treatment for osteoporosis.  She underwent previsit DEXA scan which generally shows significant improvement from 2011-2021, in all areas  scanned : spine, hips, and femuri . She has had lumbar disc prolapse for which she underwent surgery in 2002 and 2009.  Recently she was seen due to abnormal thyroid function tests.  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  She was given RAI thyroid ablation which helped her symptoms resolve and patient presents with significant clinical improvement.  She is currently on levothyroxine 25 mcg p.o. daily before breakfast for RAI induced hypothyroidism.  Her previsit thyroid function tests are consistent with appropriate replacement.   She returns with continued clinical improvement  .   See notes from previous visits.  She has mildly fluctuating body weight.  She has not made a change in her diet.  Her previsit labs still show significant dyslipidemia.       She denies dysphagia, shortness of breath, nor voice change.  She  has 3 grown children. she denies parathyroid, thyroid dysfunction.  She denies history of fragility fracture.  She remains on vitamin D and low-dose calcium supplement. Review of her medical records also indicates hyperlipidemia not on treatment.  Review of Systems Limited as above.  Objective:    BP 102/66   Pulse 68   Ht '5\' 2"'$  (1.575 m)   Wt 108 lb 12.8 oz (49.4 kg)   BMI 19.90 kg/m   Wt Readings from Last 3 Encounters:  11/29/22 108 lb 12.8 oz (49.4 kg)  11/20/22 107 lb 1.9 oz (48.6 kg)  09/11/22 108 lb 1.9 oz (49 kg)    Physical Exam     Results for orders placed or performed in visit on 07/30/22  TSH  Result Value Ref Range   TSH 2.710 0.450 - 4.500 uIU/mL  T4, free  Result Value Ref Range   Free T4 1.15 0.82 - 1.77 ng/dL  Lipid panel  Result Value Ref Range   Cholesterol, Total 202 (H) 100 - 199 mg/dL   Triglycerides 62 0 - 149 mg/dL   HDL 66 >39 mg/dL   VLDL Cholesterol Cal 11 5 - 40 mg/dL   LDL Chol Calc (NIH) 125 (H) 0 - 99 mg/dL   Chol/HDL Ratio 3.1 0.0 - 4.4 ratio    Complete Blood Count (Most recent): Lab Results  Component Value Date   WBC 4.6 03/07/2022   HGB 12.6 03/07/2022   HCT 39.3 03/07/2022   MCV 82 03/07/2022   PLT 240 03/07/2022   Chemistry (most recent): Lab Results  Component Value Date   NA 140 07/20/2022   K 4.7 07/20/2022   CL 103 07/20/2022   CO2 22 07/20/2022   BUN 12 07/20/2022   CREATININE 0.73 07/20/2022   Diabetic Labs (most recent): Lab Results  Component Value Date   HGBA1C 5.5 12/11/2016   HGBA1C 5.6 12/12/2015   HGBA1C 5.6 06/21/2015   Lipid Panel     Component Value Date/Time   CHOL 202 (H) 11/20/2022 1310   TRIG 62 11/20/2022 1310   HDL 66 11/20/2022 1310   CHOLHDL 3.1 11/20/2022 1310   CHOLHDL 3.1 03/07/2020 0952   VLDL 17 04/17/2017 0958   LDLCALC 125 (H) 11/20/2022 1310   LDLCALC 105 (H) 03/07/2020 0952   LABVLDL 11 11/20/2022 1310     Assessment & Plan:  1.  RAI induced hypothyroidism She is status post RAI thyroid ablation for hyper functioning nodule in the left lobe of her thyroid. -Her thyroid function tests are consistent with appropriate replacement.  I discussed and advised her to continue levothyroxine 25 mcg p.o. daily before breakfast.     - We discussed about the correct intake of her thyroid hormone, on empty stomach at fasting, with water, separated by at least 30 minutes from breakfast and other medications,  and separated by more than 4 hours from calcium, iron, multivitamins, acid reflux medications (PPIs). -Patient is made aware of the fact that thyroid hormone replacement is needed for life, dose to be adjusted by periodic monitoring of thyroid function tests.     2. 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  Previsit DEXA scan from May 17, 2020 showed continued progressive improvement compared to her last DEXA scan readings from 2011-2021 in spine, hips, and femuri. She is tolerating Fosamax, advised to continue Fosamax 70 mg  weekly.   She is advised on side effects and precautions. She was started on Fosamax in 2015. -  She has no recent fragility fracture.    This is  a good development for her since she has not lost significant bone density since she was started on Fosamax in 2015.  Her PTH is normal along with calcium and TFTs, ruling out secondary cause of osteoporosis. She is made aware of the possibility of treating her with Fosamax for 5 to 8 years before considering drug holiday.   Risk of untreated osteoporosis is discussed with her in detail.  Her next bone density is due in December 2024.     3) dyslipidemia: Not on treatment.  She wishes to avoid statins at this time. She is approached for dietary change.  Will definitely benefit from lifestyle medicine. - she acknowledges that there is a room for improvement in her food and drink choices. - Suggestion is made for her to avoid simple carbohydrates  from her diet including Cakes, Sweet Desserts, Ice Cream, Soda (diet and regular), Sweet Tea, Candies, Chips, Cookies, Store Bought Juices, Alcohol , Artificial Sweeteners,  Coffee Creamer, and "Sugar-free" Products, Lemonade. This will help patient to have more stable blood glucose profile and potentially avoid unintended weight gain.  The following Lifestyle Medicine recommendations according to [facility]) were discussed and and offered to patient and she  agrees to start the journey:  A. Whole Foods, Plant-Based Nutrition comprising of fruits and vegetables, plant-based proteins, whole-grain carbohydrates was discussed in detail with the patient.   A list for source of those nutrients were also provided to the patient.  Patient will use only water or unsweetened tea for hydration. B.  The need to stay away from risky substances including alcohol, smoking; obtaining 7 to 9 hours of restorative sleep, at least 150 minutes of moderate intensity exercise weekly, the importance of  healthy social connections,  and stress management techniques were discussed.    4.  Vitamin D deficiency -She is now vitamin D replete at 50.4-  She is status post vitamin D2 50,000 units weekly.  She will continue to maintain with vitamin D3 600/200 mg p.o. daily.  I advised patient to maintain close follow up with their PCP for primary care needs.   I spent 26 minutes in the care of the patient today including review of labs from Thyroid Function, CMP, and other relevant labs ; imaging/biopsy records (current and previous including abstractions from other facilities); face-to-face time discussing  her lab results and symptoms, medications doses, her options of short and long term treatment based on the latest standards of care / guidelines;   and documenting the encounter.  Megan Gibbs  participated in the discussions, expressed understanding, and voiced agreement with the above plans.  All questions were answered to her satisfaction. she is encouraged to contact clinic should she have any questions or concerns prior to her return visit.    Follow up plan: Return in about 6 months (around 05/30/2023) for Fasting Labs  in AM B4 8.  Glade Lloyd, MD Phone: [phone]  Fax: [phone]  -  This note was partially dictated with voice recognition software. Similar sounding words can be transcribed inadequately or may not  be corrected upon review.  11/29/2022, 12:33 PM

## 2022-11-30 DIAGNOSIS — R69 Illness, unspecified: Secondary | ICD-10-CM | POA: Diagnosis not present

## 2023-01-02 ENCOUNTER — Ambulatory Visit (HOSPITAL_COMMUNITY)
Admission: RE | Admit: 2023-01-02 | Discharge: 2023-01-02 | Disposition: A | Discharge status: Home / Self Care | Payer: No Typology Code available for payment source | Source: Ambulatory Visit | Attending: Family Medicine | Admitting: Family Medicine

## 2023-01-02 DIAGNOSIS — Z1231 Encounter for screening mammogram for malignant neoplasm of breast: Secondary | ICD-10-CM | POA: Insufficient documentation

## 2023-01-24 ENCOUNTER — Other Ambulatory Visit: Payer: Self-pay | Admitting: "Endocrinology

## 2023-03-08 ENCOUNTER — Other Ambulatory Visit: Payer: Self-pay | Admitting: Family Medicine

## 2023-04-16 DIAGNOSIS — E559 Vitamin D deficiency, unspecified: Secondary | ICD-10-CM | POA: Diagnosis not present

## 2023-04-16 DIAGNOSIS — E782 Mixed hyperlipidemia: Secondary | ICD-10-CM | POA: Diagnosis not present

## 2023-04-17 LAB — CBC
Hematocrit: 37.9 % (ref 34.0–46.6)
Hemoglobin: 12.2 g/dL (ref 11.1–15.9)
MCH: 26.8 pg (ref 26.6–33.0)
MCHC: 32.2 g/dL (ref 31.5–35.7)
MCV: 83 fL (ref 79–97)
Platelets: 238 10*3/uL (ref 150–450)
RBC: 4.55 x10E6/uL (ref 3.77–5.28)
RDW: 13.2 % (ref 11.7–15.4)
WBC: 4.4 10*3/uL (ref 3.4–10.8)

## 2023-04-17 LAB — CMP14+EGFR
ALT: 17 IU/L (ref 0–32)
AST: 22 IU/L (ref 0–40)
Albumin/Globulin Ratio: 1.8 (ref 1.2–2.2)
Albumin: 4.6 g/dL (ref 3.8–4.8)
Alkaline Phosphatase: 91 IU/L (ref 44–121)
BUN/Creatinine Ratio: 17 (ref 12–28)
BUN: 13 mg/dL (ref 8–27)
Bilirubin Total: 0.3 mg/dL (ref 0.0–1.2)
CO2: 22 mmol/L (ref 20–29)
Calcium: 9.9 mg/dL (ref 8.7–10.3)
Chloride: 104 mmol/L (ref 96–106)
Creatinine, Ser: 0.76 mg/dL (ref 0.57–1.00)
Globulin, Total: 2.6 g/dL (ref 1.5–4.5)
Glucose: 84 mg/dL (ref 70–99)
Potassium: 4.4 mmol/L (ref 3.5–5.2)
Sodium: 142 mmol/L (ref 134–144)
Total Protein: 7.2 g/dL (ref 6.0–8.5)
eGFR: 84 mL/min/{1.73_m2} (ref >59)

## 2023-04-17 LAB — VITAMIN D 25 HYDROXY (VIT D DEFICIENCY, FRACTURES): Vit D, 25-Hydroxy: 50.2 ng/mL (ref 30.0–100.0)

## 2023-04-17 LAB — LIPID PANEL
Chol/HDL Ratio: 3 ratio (ref 0.0–4.4)
Cholesterol, Total: 205 mg/dL — ABNORMAL HIGH (ref 100–199)
HDL: 68 mg/dL (ref >39)
LDL Chol Calc (NIH): 119 mg/dL — ABNORMAL HIGH (ref 0–99)
Triglycerides: 104 mg/dL (ref 0–149)
VLDL Cholesterol Cal: 18 mg/dL (ref 5–40)

## 2023-04-23 ENCOUNTER — Encounter: Payer: Self-pay | Admitting: Family Medicine

## 2023-04-23 ENCOUNTER — Ambulatory Visit (INDEPENDENT_AMBULATORY_CARE_PROVIDER_SITE_OTHER): Payer: No Typology Code available for payment source | Admitting: Family Medicine

## 2023-04-23 VITALS — BP 133/80 | HR 69 | Resp 16 | Ht 62.0 in | Wt 107.8 lb

## 2023-04-23 DIAGNOSIS — F411 Generalized anxiety disorder: Secondary | ICD-10-CM

## 2023-04-23 DIAGNOSIS — J3089 Other allergic rhinitis: Secondary | ICD-10-CM | POA: Diagnosis not present

## 2023-04-23 DIAGNOSIS — E0590 Thyrotoxicosis, unspecified without thyrotoxic crisis or storm: Secondary | ICD-10-CM | POA: Diagnosis not present

## 2023-04-23 DIAGNOSIS — F321 Major depressive disorder, single episode, moderate: Secondary | ICD-10-CM

## 2023-04-23 DIAGNOSIS — F409 Phobic anxiety disorder, unspecified: Secondary | ICD-10-CM

## 2023-04-23 DIAGNOSIS — F5105 Insomnia due to other mental disorder: Secondary | ICD-10-CM | POA: Diagnosis not present

## 2023-04-23 MED ORDER — MIRTAZAPINE 7.5 MG PO TABS: 7.5000 mg | ORAL_TABLET | Freq: Every day | ORAL | 2 refills | Status: DC

## 2023-04-23 MED ORDER — BUSPIRONE HCL 5 MG PO TABS: 5.0000 mg | ORAL_TABLET | Freq: Three times a day (TID) | ORAL | 2 refills | Status: DC

## 2023-04-23 NOTE — Patient Instructions (Signed)
F/U in 6 to 8  weeks re evaluate anxiety and depression  New medications for anxiety, Buspar  one tablet at 7 am, then 3 pm , then 11 pm  New medication for sleep , depression and appetite is Remeron 7.5 mg one at bedtime at 11 pm   You will be referred to Therapist as well  Recent labs are good  Thanks for choosing [facility], we consider it a privelige to serve you.

## 2023-04-28 ENCOUNTER — Encounter: Payer: Self-pay | Admitting: Family Medicine

## 2023-04-28 DIAGNOSIS — F411 Generalized anxiety disorder: Secondary | ICD-10-CM | POA: Insufficient documentation

## 2023-04-28 DIAGNOSIS — F321 Major depressive disorder, single episode, moderate: Secondary | ICD-10-CM | POA: Insufficient documentation

## 2023-04-28 NOTE — Assessment & Plan Note (Signed)
Sleep hygiene reviewed and written information offered also. Prescription sent for  medication needed.  

## 2023-04-28 NOTE — Assessment & Plan Note (Signed)
Managed by Endo, controlled  on current medication

## 2023-04-28 NOTE — Assessment & Plan Note (Signed)
Controlled, no change in medication  

## 2023-04-28 NOTE — Assessment & Plan Note (Signed)
Start buspar three times daily, and refer to Therapy

## 2023-04-28 NOTE — Progress Notes (Signed)
   [name]     MRN: [id number]      DOB: August 05, 1951  Chief Complaint  Patient presents with   Anxiety    Stays nervous all the time, can't sit still and feels like she has to be moving all the time and issues sleeping. She had lost down to 104lbs with the diet that Nida had her on and she is down to 1 ensure a day instead of 3    HPI [name] is here for follow up and re-evaluation of chronic medical conditions, medication management and review of any available recent lab and radiology data.  Preventive health is updated, specifically  Cancer screening and Immunization.   Questions or concerns regarding consultations or procedures which the PT has had in the interim are  addressed. The PT denies any adverse reactions to current medications since the last visit.  There are no new concerns.  There are no specific complaints   ROS Denies recent fever or chills. Denies sinus pressure, nasal congestion, ear pain or sore throat. Denies chest congestion, productive cough or wheezing. Denies chest pains, palpitations and leg swelling Denies abdominal pain, nausea, vomiting,diarrhea or constipation.   Denies dysuria, frequency, hesitancy or incontinence. Denies joint pain, swelling and limitation in mobility. Denies headaches, seizures, numbness, or tingling. Denies depression, anxiety or insomnia. Denies skin break down or rash.   PE  BP 133/80   Pulse 69   Resp 16   Ht 5\' 2"  (1.575 m)   Wt 107 lb 12.8 oz (48.9 kg)   SpO2 98%   BMI 19.72 kg/m   Patient alert and oriented and in no cardiopulmonary distress.  HEENT: No facial asymmetry, EOMI,     Neck supple .  Chest: Clear to auscultation bilaterally.  CVS: S1, S2 no murmurs, no S3.Regular rate.  ABD: Soft non tender.   Ext: No edema  MS: Adequate ROM spine, shoulders, hips and knees.  Skin: Intact, no ulcerations or rash noted.  Psych: Good eye contact, normal affect. Memory intact not anxious or depressed  appearing.  CNS: CN 2-12 intact, power,  normal throughout.no focal deficits noted.   Assessment & Plan  Insomnia due to anxiety and fear Sleep hygiene reviewed and written information offered also. Prescription sent for  medication needed.   Depression, major, single episode, moderate (HCC) Start remeron, mainly situational , a lot of illness in family and a recent death of an Aunt, lost brother lst year, will also benefit from therapy refer back to Therapist she has seen in the past  GAD (generalized anxiety disorder) Start buspar three times daily, and refer to Therapy  Allergic rhinitis Controlled, no change in medication   Hyperthyroidism Managed by Endo, controlled  on current medication

## 2023-04-28 NOTE — Assessment & Plan Note (Signed)
Start remeron, mainly situational , a lot of illness in family and a recent death of an Aunt, lost brother lst year, will also benefit from therapy refer back to Therapist she has seen in the past

## 2023-05-16 ENCOUNTER — Other Ambulatory Visit: Payer: Self-pay | Admitting: "Endocrinology

## 2023-05-16 DIAGNOSIS — M810 Age-related osteoporosis without current pathological fracture: Secondary | ICD-10-CM

## 2023-05-17 ENCOUNTER — Telehealth: Payer: Self-pay | Admitting: "Endocrinology

## 2023-05-17 NOTE — Telephone Encounter (Signed)
Pt called requesting a call back from a nurse about a bone denisty.

## 2023-05-17 NOTE — Telephone Encounter (Signed)
Spoke with pt, advised her we would need to schedule her bone density in late December. Pt voiced understanding.

## 2023-05-23 DIAGNOSIS — E890 Postprocedural hypothyroidism: Secondary | ICD-10-CM | POA: Diagnosis not present

## 2023-05-23 DIAGNOSIS — E782 Mixed hyperlipidemia: Secondary | ICD-10-CM | POA: Diagnosis not present

## 2023-05-24 LAB — LIPID PANEL
Chol/HDL Ratio: 3.2 ratio (ref 0.0–4.4)
Cholesterol, Total: 222 mg/dL — ABNORMAL HIGH (ref 100–199)
HDL: 69 mg/dL (ref >39)
LDL Chol Calc (NIH): 139 mg/dL — ABNORMAL HIGH (ref 0–99)
Triglycerides: 80 mg/dL (ref 0–149)
VLDL Cholesterol Cal: 14 mg/dL (ref 5–40)

## 2023-05-24 LAB — TSH: TSH: 5.67 u[IU]/mL — ABNORMAL HIGH (ref 0.450–4.500)

## 2023-05-24 LAB — T4, FREE: Free T4: 1.08 ng/dL (ref 0.82–1.77)

## 2023-05-30 ENCOUNTER — Ambulatory Visit (INDEPENDENT_AMBULATORY_CARE_PROVIDER_SITE_OTHER): Payer: No Typology Code available for payment source | Admitting: "Endocrinology

## 2023-05-30 ENCOUNTER — Encounter: Payer: Self-pay | Admitting: "Endocrinology

## 2023-05-30 VITALS — BP 98/64 | HR 80 | Ht 62.0 in | Wt 110.2 lb

## 2023-05-30 DIAGNOSIS — E782 Mixed hyperlipidemia: Secondary | ICD-10-CM

## 2023-05-30 DIAGNOSIS — M810 Age-related osteoporosis without current pathological fracture: Secondary | ICD-10-CM | POA: Diagnosis not present

## 2023-05-30 DIAGNOSIS — E890 Postprocedural hypothyroidism: Secondary | ICD-10-CM

## 2023-05-30 DIAGNOSIS — E559 Vitamin D deficiency, unspecified: Secondary | ICD-10-CM | POA: Diagnosis not present

## 2023-05-30 MED ORDER — LEVOTHYROXINE SODIUM 50 MCG PO TABS: 50.0000 ug | ORAL_TABLET | Freq: Every day | ORAL | 1 refills | Status: DC

## 2023-05-30 NOTE — Progress Notes (Signed)
05/30/2023     Endocrinology follow-up note   Subjective:    Patient ID: Megan Gibbs, female    DOB: October 31, 1951,    Past Medical History:  Diagnosis Date   Allergic rhinitis    Anemia    Anxiety    Back pain    Depression    Hypokalemia    Hypothyroidism    Osteoporosis    Prediabetes    Seasonal allergies    Seasonal allergies    Vertigo    Wears glasses    Past Surgical History:  Procedure Laterality Date   BACK SURGERY  1992 & 2009   Dr. Jeral Fruit    BREAST BIOPSY     BREAST EXCISIONAL BIOPSY     BREAST LUMPECTOMY WITH RADIOACTIVE SEED LOCALIZATION Right 12/01/2014   Procedure: RIGHT BREAST LUMPECTOMY WITH RADIOACTIVE SEED LOCALIZATION;  Surgeon: Claud Kelp, MD;  Location: [facility];  Service: General;  Laterality: Right;   CATARACT EXTRACTION W/PHACO Right 06/15/2022   Procedure: CATARACT EXTRACTION PHACO AND INTRAOCULAR LENS PLACEMENT (IOC);  Surgeon: Fabio Pierce, MD;  Location: [facility];  Service: Ophthalmology;  Laterality: Right;  CDE: 10.49   CATARACT EXTRACTION W/PHACO Left 07/09/2022   Procedure: CATARACT EXTRACTION PHACO AND INTRAOCULAR LENS PLACEMENT (IOC);  Surgeon: Fabio Pierce, MD;  Location: [facility];  Service: Ophthalmology;  Laterality: Left;  CDE: 9.15   COLONOSCOPY N/A 04/19/2014   Procedure: COLONOSCOPY;  Surgeon: Corbin Ade, MD;  Location: [facility];  Service: Endoscopy;  Laterality: N/A;  9:30 AM   DILATION AND CURETTAGE OF UTERUS     TUBAL LIGATION     1976   Social History   Socioeconomic History   Marital status: Divorced    Spouse name: Not on file   Number of children: 3   Years of education: Not on file   Highest education level: Not on file  Occupational History   Occupation: disabled     Employer: UNEMPLOYED  Tobacco Use   Smoking status: Never   Smokeless tobacco: Never  Vaping Use   Vaping Use: Never used  Substance and Sexual Activity   Alcohol use: No   Drug use: No   Sexual activity: Yes     Birth control/protection: Post-menopausal  Other Topics Concern   Not on file  Social History Narrative   DOING A LOT OF TRAVELING WITH HER CHURCH.    Social Determinants of Health   Financial Resource Strain: Low Risk  (07/30/2022)   Overall Financial Resource Strain (CARDIA)    Difficulty of Paying Living Expenses: Not hard at all  Food Insecurity: No Food Insecurity (07/30/2022)   Hunger Vital Sign    Worried About Running Out of Food in the Last Year: Never true    Ran Out of Food in the Last Year: Never true  Transportation Needs: No Transportation Needs (07/30/2022)   PRAPARE - Administrator, Civil Service (Medical): No    Lack of Transportation (Non-Medical): No  Physical Activity: Inactive (07/30/2022)   Exercise Vital Sign    Days of Exercise per Week: 0 days    Minutes of Exercise per Session: 0 min  Stress: No Stress Concern Present (07/30/2022)   [employer] of Occupational Health - Occupational Stress Questionnaire    Feeling of Stress : Not at all  Social Connections: Moderately Isolated (07/30/2022)   Social Connection and Isolation Panel [NHANES]    Frequency of Communication with Friends and Family: More than three times a  week    Frequency of Social Gatherings with Friends and Family: More than three times a week    Attends Religious Services: More than 4 times per year    Active Member of [employer] or Organizations: No    Attends Banker Meetings: Never    Marital Status: Divorced   Outpatient Encounter Medications as of 05/30/2023  Medication Sig   alendronate (FOSAMAX) 70 MG tablet Take with a full glass of water on an empty stomach.   busPIRone (BUSPAR) 5 MG tablet Take 1 tablet (5 mg total) by mouth 3 (three) times daily.   Calcium Carb-Cholecalciferol (OYSTER SHELL CALCIUM W/D) 500-5 MG-MCG TABS TAKE (1) TABLET TWICE DAILY.   Ensure (ENSURE) Take 237 mLs by mouth 2 (two) times daily between meals. VANILLA (Patient taking differently:  Take 237 mLs by mouth 3 (three) times daily between meals. VANILLA)   fexofenadine (ALLEGRA ALLERGY) 180 MG tablet Take 1 tablet (180 mg total) by mouth daily. (Patient taking differently: Take 180 mg by mouth daily as needed.)   fluticasone (FLONASE) 50 MCG/ACT nasal spray Place 2 sprays into both nostrils daily. (Patient taking differently: Place 2 sprays into both nostrils daily as needed.)   levothyroxine (SYNTHROID) 50 MCG tablet Take 1 tablet (50 mcg total) by mouth daily before breakfast.   melatonin 3 MG TABS tablet Take 1 tablet (3 mg total) by mouth at bedtime as needed.   mirtazapine (REMERON) 7.5 MG tablet Take 1 tablet (7.5 mg total) by mouth at bedtime.   Multiple Vitamins-Minerals (MULTIVITAMIN ADULTS 50+ PO) Take 1 tablet by mouth daily.   omega-3 fish oil (MAXEPA) 1000 MG CAPS capsule Take 2 capsules by mouth daily.    [DISCONTINUED] levothyroxine (SYNTHROID) 25 MCG tablet TAKE 1 TABLET BY MOUTH EVERY MORNING BEFORE BREAKFAST.   No facility-administered encounter medications on file as of 05/30/2023.   ALLERGIES: No Known Allergies VACCINATION STATUS: Immunization History  Administered Date(s) Administered   Fluad Quad(high Dose 65+) 09/14/2019, 10/24/2020, 11/07/2021, 09/11/2022   Influenza Split 09/24/2012   Influenza Whole 10/04/2009, 09/29/2010, 09/17/2011   Influenza,inj,Quad PF,6+ Mos 10/29/2013, 10/11/2014, 09/20/2015, 08/20/2016, 09/17/2017, 09/18/2018   Moderna Covid-19 Vaccine Bivalent Booster 42yrs & up 11/13/2022   Moderna SARS-COV2 Booster Vaccination 07/04/2021, 11/21/2021   Moderna Sars-Covid-2 Vaccination 03/09/2020, 04/10/2020, 11/10/2020   Pneumococcal Conjugate-13 03/17/2015   Pneumococcal Polysaccharide-23 12/18/2016   Td 03/24/2010   Zoster Recombinat (Shingrix) 04/10/2023   Zoster, Live 09/20/2011    HPI  72 yr old female with medical hx as above.  She is following in this clinic for management of osteoporosis.  She is on Fosamax 70 mg p.o.  weekly since 2015.  She has no complaints regarding her treatment for osteoporosis.   She has had lumbar disc prolapse for which she underwent surgery in 2002 and 2009. Her last bone density was in December 2022. Recently she was seen due to abnormal thyroid function tests.  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  She was given RAI thyroid ablation which helped her symptoms resolve and patient presents with significant clinical improvement.  She is currently on levothyroxine 25 mcg p.o. daily before breakfast for RAI induced hypothyroidism.  Her previsit thyroid function tests are consistent with under replacement.  She returns with continued clinical improvement  .   See notes from previous visits.  She has mildly fluctuating body weight.  She has not made a change in her diet.  Her previsit labs still show significant  dyslipidemia.       She denies dysphagia, shortness of breath, nor voice change. She  has 3 grown children. she denies parathyroid, thyroid dysfunction.  She denies history of fragility fracture.  She remains on vitamin D and low-dose calcium supplement. Review of her medical records also indicates hyperlipidemia not on treatment.  Her recent labs shows that her dyslipidemia is worsening, patient still does not want to take medications.  Review of Systems Limited as above.  Objective:    BP 98/64   Pulse 80   Ht 5\' 2"  (1.575 m)   Wt 110 lb 3.2 oz (50 kg)   BMI 20.16 kg/m   Wt Readings from Last 3 Encounters:  05/30/23 110 lb 3.2 oz (50 kg)  04/23/23 107 lb 12.8 oz (48.9 kg)  11/29/22 108 lb 12.8 oz (49.4 kg)    Physical Exam     Results for orders placed or performed in visit on 11/29/22  TSH  Result Value Ref Range   TSH 5.670 (H) 0.450 - 4.500 uIU/mL  T4, free  Result Value Ref Range   Free T4 1.08 0.82 - 1.77 ng/dL  Lipid panel  Result Value Ref Range   Cholesterol, Total 222  (H) 100 - 199 mg/dL   Triglycerides 80 0 - 149 mg/dL   HDL 69 >82 mg/dL   VLDL Cholesterol Cal 14 5 - 40 mg/dL   LDL Chol Calc (NIH) 956 (H) 0 - 99 mg/dL   Chol/HDL Ratio 3.2 0.0 - 4.4 ratio   Complete Blood Count (Most recent): Lab Results  Component Value Date   WBC 4.4 04/16/2023   HGB 12.2 04/16/2023   HCT 37.9 04/16/2023   MCV 83 04/16/2023   PLT 238 04/16/2023   Chemistry (most recent): Lab Results  Component Value Date   NA 142 04/16/2023   K 4.4 04/16/2023   CL 104 04/16/2023   CO2 22 04/16/2023   BUN 13 04/16/2023   CREATININE 0.76 04/16/2023   Diabetic Labs (most recent): Lab Results  Component Value Date   HGBA1C 5.5 12/11/2016   HGBA1C 5.6 12/12/2015   HGBA1C 5.6 06/21/2015   Lipid Panel     Component Value Date/Time   CHOL 222 (H) 05/23/2023 0838   TRIG 80 05/23/2023 0838   HDL 69 05/23/2023 0838   CHOLHDL 3.2 05/23/2023 0838   CHOLHDL 3.1 03/07/2020 0952   VLDL 17 04/17/2017 0958   LDLCALC 139 (H) 05/23/2023 0838   LDLCALC 105 (H) 03/07/2020 0952   LABVLDL 14 05/23/2023 0838     Assessment & Plan:  1.  RAI induced hypothyroidism She is status post RAI thyroid ablation for hyper functioning nodule in the left lobe of her thyroid. -Her thyroid function tests are consistent with under replacement.  I discussed and increase her levothyroxine to 50 mcg p.o. daily before breakfast.     - We discussed about the correct intake of her thyroid hormone, on empty stomach at fasting, with water, separated by at least 30 minutes from breakfast and other medications,  and separated by more than 4 hours from calcium, iron, multivitamins, acid reflux medications (PPIs). -Patient is made aware of the fact that thyroid hormone replacement is needed for life, dose to be adjusted by periodic monitoring of thyroid function tests.   2. 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  Previsit DEXA scan from May 17, 2020 showed  continued progressive improvement compared to her last DEXA scan readings from 2011-2021 in  spine, hips, and femuri. She is tolerating Fosamax, advised to continue Fosamax 70 mg weekly.   She is advised on side effects and precautions. She was started on Fosamax in 2015. -She has no recent fragility fracture.  She will have repeat, surveillance bone density in December 2024.   Her PTH is normal along with calcium and TFTs, ruling out secondary cause of osteoporosis. She is made aware of the possibility of treating her with Fosamax for 5 to 8 years before considering drug holiday.   Risk of untreated osteoporosis is discussed with her in detail.   3) dyslipidemia: Not on treatment.  She was approached for low-dose statin intervention, however she wishes to avoid statins at this time.  She is approached for dietary change.  She will definitely benefit from whole food plant-based diet.    - she acknowledges that there is a room for improvement in her food and drink choices. - Suggestion is made for her to avoid simple carbohydrates  from her diet including Cakes, Sweet Desserts, Ice Cream, Soda (diet and regular), Sweet Tea, Candies, Chips, Cookies, Store Bought Juices, Alcohol , Artificial Sweeteners,  Coffee Creamer, and "Sugar-free" Products, Lemonade. This will help patient to have more stable blood glucose profile and potentially avoid unintended weight gain.  The following Lifestyle Medicine recommendations according to American College of Lifestyle Medicine  [facility]) were discussed and and offered to patient and she  agrees to start the journey:  A. Whole Foods, Plant-Based Nutrition comprising of fruits and vegetables, plant-based proteins, whole-grain carbohydrates was discussed in detail with the patient.   A list for source of those nutrients were also provided to the patient.  Patient will use only water or unsweetened tea for hydration. B.  The need to stay away from risky substances  including alcohol, smoking; obtaining 7 to 9 hours of restorative sleep, at least 150 minutes of moderate intensity exercise weekly, the importance of healthy social connections,  and stress management techniques were discussed. C.  A full color page of  Calorie density of various food groups per pound showing examples of each food groups was provided to the patient.    4.  Vitamin D deficiency -She is now vitamin D replete at 50.4-  She is status post vitamin D2 50,000 units weekly.  She will continue to maintain with vitamin D3 600/200 mg p.o. daily.  I advised patient to maintain close follow up with their PCP for primary care needs.   I spent  25  minutes in the care of the patient today including review of labs from Thyroid Function, CMP, and other relevant labs ; imaging/biopsy records (current and previous including abstractions from other facilities); face-to-face time discussing  her lab results and symptoms, medications doses, her options of short and long term treatment based on the latest standards of care / guidelines;   and documenting the encounter.  Megan Gibbs  participated in the discussions, expressed understanding, and voiced agreement with the above plans.  All questions were answered to her satisfaction. she is encouraged to contact clinic should she have any questions or concerns prior to her return visit.     Follow up plan: Return in about 6 months (around 11/30/2023).  Marquis Lunch, MD Phone: [phone]  Fax: [phone]  -  This note was partially dictated with voice recognition software. Similar sounding words can be transcribed inadequately or may not  be corrected upon review.  05/30/2023, 11:08 AM

## 2023-06-04 ENCOUNTER — Ambulatory Visit: Payer: No Typology Code available for payment source | Admitting: Family Medicine

## 2023-06-26 ENCOUNTER — Ambulatory Visit (INDEPENDENT_AMBULATORY_CARE_PROVIDER_SITE_OTHER): Payer: No Typology Code available for payment source | Admitting: Family Medicine

## 2023-06-26 ENCOUNTER — Encounter: Payer: Self-pay | Admitting: Family Medicine

## 2023-06-26 VITALS — BP 120/61 | HR 66 | Ht 62.0 in | Wt 109.1 lb

## 2023-06-26 DIAGNOSIS — F5105 Insomnia due to other mental disorder: Secondary | ICD-10-CM | POA: Diagnosis not present

## 2023-06-26 DIAGNOSIS — E890 Postprocedural hypothyroidism: Secondary | ICD-10-CM

## 2023-06-26 DIAGNOSIS — R630 Anorexia: Secondary | ICD-10-CM

## 2023-06-26 DIAGNOSIS — M810 Age-related osteoporosis without current pathological fracture: Secondary | ICD-10-CM

## 2023-06-26 DIAGNOSIS — F4321 Adjustment disorder with depressed mood: Secondary | ICD-10-CM | POA: Diagnosis not present

## 2023-06-26 DIAGNOSIS — J3089 Other allergic rhinitis: Secondary | ICD-10-CM

## 2023-06-26 DIAGNOSIS — F321 Major depressive disorder, single episode, moderate: Secondary | ICD-10-CM

## 2023-06-26 DIAGNOSIS — F409 Phobic anxiety disorder, unspecified: Secondary | ICD-10-CM | POA: Diagnosis not present

## 2023-06-26 MED ORDER — MIRTAZAPINE 7.5 MG PO TABS: 7.5000 mg | ORAL_TABLET | Freq: Every day | ORAL | 2 refills | Status: DC

## 2023-06-26 MED ORDER — MONTELUKAST SODIUM 10 MG PO TABS: ORAL_TABLET | ORAL | 2 refills | Status: DC

## 2023-06-26 NOTE — Assessment & Plan Note (Signed)
Resolved, pt to continue remeron and take it regularly

## 2023-06-26 NOTE — Assessment & Plan Note (Signed)
Improving / lessening with time

## 2023-06-26 NOTE — Assessment & Plan Note (Signed)
Managed by Endo, controlled  

## 2023-06-26 NOTE — Progress Notes (Signed)
   Megan Gibbs     MRN: [id number]      DOB: 06-07-1951  Chief Complaint  Patient presents with   Follow-up    6 wk follow up    HPI Megan Gibbs is here for follow up and re-evaluation of chronic medical conditions, medication management and review of any available recent lab and radiology data.  Preventive health is updated, specifically  Cancer screening and Immunization.   Has been taking Remeron intermittently and has taken buspar at most once per day and veryinfrequently States she feels beeter because she changed her Ensure to Boost which is high in protein   ROS Denies recent fever or chills. Denies sinus pressure, nasal congestion, ear pain or sore throat. Denies chest congestion, productive cough or wheezing. Denies chest pains, palpitations and leg swelling Denies abdominal pain, nausea, vomiting,diarrhea or constipation.   Denies dysuria, frequency, hesitancy or incontinence. Denies joint pain, swelling and limitation in mobility. Denies headaches, seizures, numbness, or tingling. Denies depression, anxiety or insomnia. Denies skin break down or rash.   PE  BP 120/61 (BP Location: Right Arm, Patient Position: Sitting, Cuff Size: Normal)   Pulse 66   Ht 5\' 2"  (1.575 m)   Wt 109 lb 1.3 oz (49.5 kg)   SpO2 98%   BMI 19.95 kg/m   Patient alert and oriented and in no cardiopulmonary distress.  HEENT: No facial asymmetry, EOMI,     Neck supple .  Chest: Clear to auscultation bilaterally.  CVS: S1, S2 no murmurs, no S3.Regular rate.  ABD: Soft non tender.   Ext: No edema  MS: Adequate ROM spine, shoulders, hips and knees.  Skin: Intact, no ulcerations or rash noted.  Psych: Good eye contact, normal affect. Memory intact not anxious or depressed appearing.  CNS: CN 2-12 intact, power,  normal throughout.no focal deficits noted.   Assessment & Plan  Insomnia due to anxiety and fear Improved, d/c buspar   Depression, major, single episode, moderate  (HCC) Resolved, pt to continue remeron and take it regularly  Hypothyroidism following radioiodine therapy Managed by Endo, controlled   Poor appetite Improved but neds to commit to remeron till next appointment  Postmenopausal osteoporosis Being treated by Endo  Allergic rhinitis Currently uncontrolled , start daily singulair   Grief Improving / lessening with time

## 2023-06-26 NOTE — Assessment & Plan Note (Signed)
Improved, d/c buspar

## 2023-06-26 NOTE — Patient Instructions (Addendum)
F/u in 14 weeks, call if you need me sooner  Please commit as able to taking the Remeron and montelukast daily as prescribed . If you have problems with the medications , call and let me know, either of them can be stop[ped if they are causing a problem  Glad you are feeling better  Thanks for choosing [facility], we consider it a privelige to serve you.

## 2023-06-26 NOTE — Assessment & Plan Note (Signed)
Improved but neds to commit to remeron till next appointment

## 2023-06-26 NOTE — Assessment & Plan Note (Signed)
Currently uncontrolled , start daily singulair 

## 2023-06-26 NOTE — Assessment & Plan Note (Signed)
Being treated by Endo

## 2023-08-12 ENCOUNTER — Ambulatory Visit (HOSPITAL_COMMUNITY): Payer: No Typology Code available for payment source | Admitting: Psychiatry

## 2023-08-20 ENCOUNTER — Ambulatory Visit (INDEPENDENT_AMBULATORY_CARE_PROVIDER_SITE_OTHER): Payer: No Typology Code available for payment source

## 2023-08-20 VITALS — Ht 61.0 in | Wt 111.8 lb

## 2023-08-20 DIAGNOSIS — Z0000 Encounter for general adult medical examination without abnormal findings: Secondary | ICD-10-CM | POA: Diagnosis not present

## 2023-08-20 DIAGNOSIS — Z780 Asymptomatic menopausal state: Secondary | ICD-10-CM | POA: Diagnosis not present

## 2023-08-20 NOTE — Patient Instructions (Signed)
Megan Gibbs , Thank you for taking time to come for your Medicare Wellness Visit. I appreciate your ongoing commitment to your health goals. Please review the following plan we discussed and let me know if I can assist you in the future.   These are the goals we discussed:  Goals      Patient Stated     Find a new place to live        This is a list of the screening recommended for you and due dates:  Health Maintenance  Topic Date Due   DTaP/Tdap/Td vaccine (2 - Tdap) 03/24/2020   COVID-19 Vaccine (5 - 2023-24 season) 01/08/2023   Flu Shot  08/01/2023   Mammogram  01/03/2024   Colon Cancer Screening  04/19/2024   Medicare Annual Wellness Visit  08/19/2024   Pneumonia Vaccine  Completed   DEXA scan (bone density measurement)  Completed   Hepatitis C Screening  Completed   Zoster (Shingles) Vaccine  Completed   HPV Vaccine  Aged Out    Advanced directives: Advance directive discussed with you today. Even though you declined this today, please call our office should you change your mind, and we can give you the proper paperwork for you to fill out. Advance care planning is a way to make decisions about medical care that fits your values in case you are ever unable to make these decisions for yourself.  Information on Advanced Care Planning can be found at [facility] Advance Health Care Directives Advance Health Care Directives ([email or web address]    Conditions/risks identified:  You have an order for:  []   2D Mammogram  []   3D Mammogram  [x]   Bone Density   []   Lung Cancer Screening  Please call for appointment:   [facility] at [facility] [address] [place], [place] [address] [phone]  Make sure to wear two-piece clothing.  No lotions powders or deodorants the day of the appointment Make sure to bring picture ID and insurance card.  Bring list of medications you are currently taking including any supplements.   Schedule  your [facility] screening mammogram through MyChart!   Log into your MyChart account.  Go to 'Visit' (or 'Appointments' if on mobile App) --> Schedule an Appointment  Under 'Select a Reason for Visit' choose the Mammogram Screening option.  Complete the pre-visit questions and select the time and place that best fits your schedule.   You are due for the vaccines checked below. You may have these done at your preferred pharmacy. Please have them fax the office proof of the vaccines so that we can update your chart.   [x]  Flu (due annually) []  Shingrix (Shingles vaccine) []  Pneumonia Vaccines []  TDAP (Tetanus) Vaccine every 10 years [x]  Covid-19  Next appointment: VIRTUAL/TELEPHONE APPOINTMENT Follow up in one year for your annual wellness visit  October 07, 2024 at 3:30 pm telephone visit.    Preventive Care 18 Years and Older, Female Preventive care refers to lifestyle choices and visits with your health care provider that can promote health and wellness. What does preventive care include? A yearly physical exam. This is also called an annual well check. Dental exams once or twice a year. Routine eye exams. Ask your health care provider how often you should have your eyes checked. Personal lifestyle choices, including: Daily care of your teeth and gums. Regular physical activity. Eating a healthy diet. Avoiding tobacco and drug use. Limiting alcohol use. Practicing  safe sex. Taking low-dose aspirin every day. Taking vitamin and mineral supplements as recommended by your health care provider. What happens during an annual well check? The services and screenings done by your health care provider during your annual well check will depend on your age, overall health, lifestyle risk factors, and family history of disease. Counseling  Your health care provider may ask you questions about your: Alcohol use. Tobacco use. Drug use. Emotional well-being. Home and relationship  well-being. Sexual activity. Eating habits. History of falls. Memory and ability to understand (cognition). Work and work Astronomer. Reproductive health. Screening  You may have the following tests or measurements: Height, weight, and BMI. Blood pressure. Lipid and cholesterol levels. These may be checked every 5 years, or more frequently if you are over 37 years old. Skin check. Lung cancer screening. You may have this screening every year starting at age 32 if you have a 30-pack-year history of smoking and currently smoke or have quit within the past 15 years. Fecal occult blood test (FOBT) of the stool. You may have this test every year starting at age 65. Flexible sigmoidoscopy or colonoscopy. You may have a sigmoidoscopy every 5 years or a colonoscopy every 10 years starting at age 26. Hepatitis C blood test. Hepatitis B blood test. Sexually transmitted disease (STD) testing. Diabetes screening. This is done by checking your blood sugar (glucose) after you have not eaten for a while (fasting). You may have this done every 1-3 years. Bone density scan. This is done to screen for osteoporosis. You may have this done starting at age 36. Mammogram. This may be done every 1-2 years. Talk to your health care provider about how often you should have regular mammograms. Talk with your health care provider about your test results, treatment options, and if necessary, the need for more tests. Vaccines  Your health care provider may recommend certain vaccines, such as: Influenza vaccine. This is recommended every year. Tetanus, diphtheria, and acellular pertussis (Tdap, Td) vaccine. You may need a Td booster every 10 years. Zoster vaccine. You may need this after age 56. Pneumococcal 13-valent conjugate (PCV13) vaccine. One dose is recommended after age 5. Pneumococcal polysaccharide (PPSV23) vaccine. One dose is recommended after age 43. Talk to your health care provider about which  screenings and vaccines you need and how often you need them. This information is not intended to replace advice given to you by your health care provider. Make sure you discuss any questions you have with your health care provider. Document Released: 01/13/2016 Document Revised: 09/05/2016 Document Reviewed: 10/18/2015 Elsevier Interactive Patient Education  2017 [employer].  Fall Prevention in the Home Falls can cause injuries. They can happen to people of all ages. There are many things you can do to make your home safe and to help prevent falls. What can I do on the outside of my home? Regularly fix the edges of walkways and driveways and fix any cracks. Remove anything that might make you trip as you walk through a door, such as a raised step or threshold. Trim any bushes or trees on the path to your home. Use bright outdoor lighting. Clear any walking paths of anything that might make someone trip, such as rocks or tools. Regularly check to see if handrails are loose or broken. Make sure that both sides of any steps have handrails. Any raised decks and porches should have guardrails on the edges. Have any leaves, snow, or ice cleared regularly. Use sand or salt  on walking paths during winter. Clean up any spills in your garage right away. This includes oil or grease spills. What can I do in the bathroom? Use night lights. Install grab bars by the toilet and in the tub and shower. Do not use towel bars as grab bars. Use non-skid mats or decals in the tub or shower. If you need to sit down in the shower, use a plastic, non-slip stool. Keep the floor dry. Clean up any water that spills on the floor as soon as it happens. Remove soap buildup in the tub or shower regularly. Attach bath mats securely with double-sided non-slip rug tape. Do not have throw rugs and other things on the floor that can make you trip. What can I do in the bedroom? Use night lights. Make sure that you have a  light by your bed that is easy to reach. Do not use any sheets or blankets that are too big for your bed. They should not hang down onto the floor. Have a firm chair that has side arms. You can use this for support while you get dressed. Do not have throw rugs and other things on the floor that can make you trip. What can I do in the kitchen? Clean up any spills right away. Avoid walking on wet floors. Keep items that you use a lot in easy-to-reach places. If you need to reach something above you, use a strong step stool that has a grab bar. Keep electrical cords out of the way. Do not use floor polish or wax that makes floors slippery. If you must use wax, use non-skid floor wax. Do not have throw rugs and other things on the floor that can make you trip. What can I do with my stairs? Do not leave any items on the stairs. Make sure that there are handrails on both sides of the stairs and use them. Fix handrails that are broken or loose. Make sure that handrails are as long as the stairways. Check any carpeting to make sure that it is firmly attached to the stairs. Fix any carpet that is loose or worn. Avoid having throw rugs at the top or bottom of the stairs. If you do have throw rugs, attach them to the floor with carpet tape. Make sure that you have a light switch at the top of the stairs and the bottom of the stairs. If you do not have them, ask someone to add them for you. What else can I do to help prevent falls? Wear shoes that: Do not have high heels. Have rubber bottoms. Are comfortable and fit you well. Are closed at the toe. Do not wear sandals. If you use a stepladder: Make sure that it is fully opened. Do not climb a closed stepladder. Make sure that both sides of the stepladder are locked into place. Ask someone to hold it for you, if possible. Clearly mark and make sure that you can see: Any grab bars or handrails. First and last steps. Where the edge of each step  is. Use tools that help you move around (mobility aids) if they are needed. These include: Canes. Walkers. Scooters. Crutches. Turn on the lights when you go into a dark area. Replace any light bulbs as soon as they burn out. Set up your furniture so you have a clear path. Avoid moving your furniture around. If any of your floors are uneven, fix them. If there are any pets around you, be aware of where  they are. Review your medicines with your doctor. Some medicines can make you feel dizzy. This can increase your chance of falling. Ask your doctor what other things that you can do to help prevent falls. This information is not intended to replace advice given to you by your health care provider. Make sure you discuss any questions you have with your health care provider. Document Released: 10/13/2009 Document Revised: 05/24/2016 Document Reviewed: 01/21/2015 Elsevier Interactive Patient Education  2017 [employer].

## 2023-08-20 NOTE — Progress Notes (Signed)
Subjective:   Megan Gibbs is a 72 y.o. female who presents for Medicare Annual (Subsequent) preventive examination.  Visit Complete: Virtual  I connected with  Megan Gibbs on 08/20/23 by a audio enabled telemedicine application and verified that I am speaking with the correct person using two identifiers.  Patient Location: Home  Provider Location: Home Office  I discussed the limitations of evaluation and management by telemedicine. The patient expressed understanding and agreed to proceed.  Patient Medicare AWV questionnaire was completed by the patient on n/a; I have confirmed that all information answered by patient is correct and no changes since this date.  Review of Systems     Cardiac Risk Factors include: advanced age (>41men, >47 women);sedentary lifestyle     Objective:    Today's Vitals   08/20/23 1346 08/20/23 1349  Weight: 111 lb 12.8 oz (50.7 kg)   Height: 5\' 1"  (1.549 m)   PainSc:  0-No pain   Body mass index is 21.12 kg/m.     08/20/2023    1:46 PM 07/30/2022   11:43 AM 07/09/2022   10:43 AM 06/08/2022    2:26 PM 07/20/2021    2:45 PM 07/18/2020   10:11 AM 06/18/2018    9:33 AM  Advanced Directives  Does Patient Have a Medical Advance Directive? No No No No No No No  Does patient want to make changes to medical advance directive?      Yes (ED - Information included in AVS)   Would patient like information on creating a medical advance directive? No - Patient declined No - Patient declined No - Patient declined No - Patient declined No - Patient declined  Yes (MAU/Ambulatory/Procedural Areas - Information given)    Current Medications (verified) Outpatient Encounter Medications as of 08/20/2023  Medication Sig   alendronate (FOSAMAX) 70 MG tablet Take with a full glass of water on an empty stomach.   Calcium Carb-Cholecalciferol (OYSTER SHELL CALCIUM W/D) 500-5 MG-MCG TABS TAKE (1) TABLET TWICE DAILY.   levothyroxine (SYNTHROID) 50 MCG tablet Take 1 tablet  (50 mcg total) by mouth daily before breakfast.   mirtazapine (REMERON) 7.5 MG tablet Take 1 tablet (7.5 mg total) by mouth at bedtime.   montelukast (SINGULAIR) 10 MG tablet Take one tablet by mouth once daily , as needed, for allergies   Multiple Vitamins-Minerals (MULTIVITAMIN ADULTS 50+ PO) Take 1 tablet by mouth daily.   omega-3 fish oil (MAXEPA) 1000 MG CAPS capsule Take 2 capsules by mouth daily.    No facility-administered encounter medications on file as of 08/20/2023.    Allergies (verified) Patient has no known allergies.   History: Past Medical History:  Diagnosis Date   Allergic rhinitis    Anemia    Anxiety    Back pain    Depression    Hypokalemia    Hypothyroidism    Osteoporosis    Prediabetes    Seasonal allergies    Seasonal allergies    Vertigo    Wears glasses    Past Surgical History:  Procedure Laterality Date   BACK SURGERY  1992 & 2009   Dr. Jeral Fruit    BREAST BIOPSY     BREAST EXCISIONAL BIOPSY     BREAST LUMPECTOMY WITH RADIOACTIVE SEED LOCALIZATION Right 12/01/2014   Procedure: RIGHT BREAST LUMPECTOMY WITH RADIOACTIVE SEED LOCALIZATION;  Surgeon: Claud Kelp, MD;  Location:  SURGERY CENTER;  Service: General;  Laterality: Right;   CATARACT EXTRACTION W/PHACO Right 06/15/2022   Procedure:  CATARACT EXTRACTION PHACO AND INTRAOCULAR LENS PLACEMENT (IOC);  Surgeon: Fabio Pierce, MD;  Location: [facility];  Service: Ophthalmology;  Laterality: Right;  CDE: 10.49   CATARACT EXTRACTION W/PHACO Left 07/09/2022   Procedure: CATARACT EXTRACTION PHACO AND INTRAOCULAR LENS PLACEMENT (IOC);  Surgeon: Fabio Pierce, MD;  Location: [facility];  Service: Ophthalmology;  Laterality: Left;  CDE: 9.15   COLONOSCOPY N/A 04/19/2014   Procedure: COLONOSCOPY;  Surgeon: Corbin Ade, MD;  Location: [facility];  Service: Endoscopy;  Laterality: N/A;  9:30 AM   DILATION AND CURETTAGE OF UTERUS     TUBAL LIGATION     1976   Family History  Problem Relation Age of  Onset   Diabetes Mother    Hypertension Mother    Asthma Sister    Thyroid disease Sister    COPD Sister    Anemia Sister    Diabetes Sister    Hypertension Sister    Diabetes Sister    Schizophrenia Father    Drug abuse Brother    Alcohol abuse Brother    Sleep apnea Grandchild    ADD / ADHD Grandchild    Seizures Grandchild    Breast cancer Sister 74   Thyroid disease Sister    Hypertension Brother    Anxiety disorder Maternal Aunt    Hypertension Daughter    BRCA 1/2 Daughter    Anemia Daughter    Hypertension Daughter    Asthma Daughter    Colon cancer Neg Hx    Social History   Socioeconomic History   Marital status: Divorced    Spouse name: Not on file   Number of children: 3   Years of education: Not on file   Highest education level: Not on file  Occupational History   Occupation: disabled     Employer: UNEMPLOYED  Tobacco Use   Smoking status: Never   Smokeless tobacco: Never  Vaping Use   Vaping status: Never Used  Substance and Sexual Activity   Alcohol use: No   Drug use: No   Sexual activity: Yes    Birth control/protection: Post-menopausal  Other Topics Concern   Not on file  Social History Narrative   DOING A LOT OF TRAVELING WITH HER CHURCH.    Social Determinants of Health   Financial Resource Strain: Low Risk  (08/20/2023)   Overall Financial Resource Strain (CARDIA)    Difficulty of Paying Living Expenses: Not hard at all  Food Insecurity: No Food Insecurity (08/20/2023)   Hunger Vital Sign    Worried About Running Out of Food in the Last Year: Never true    Ran Out of Food in the Last Year: Never true  Transportation Needs: No Transportation Needs (08/20/2023)   PRAPARE - Administrator, Civil Service (Medical): No    Lack of Transportation (Non-Medical): No  Physical Activity: Inactive (08/20/2023)   Exercise Vital Sign    Days of Exercise per Week: 0 days    Minutes of Exercise per Session: 0 min  Stress: No Stress  Concern Present (08/20/2023)   [employer] of Occupational Health - Occupational Stress Questionnaire    Feeling of Stress : Not at all  Social Connections: Moderately Integrated (08/20/2023)   Social Connection and Isolation Panel [NHANES]    Frequency of Communication with Friends and Family: More than three times a week    Frequency of Social Gatherings with Friends and Family: More than three times a week    Attends Religious Services:  More than 4 times per year    Active Member of Clubs or Organizations: Yes    Attends Banker Meetings: More than 4 times per year    Marital Status: Divorced    Tobacco Counseling Counseling given: Yes   Clinical Intake:  Pre-visit preparation completed: Yes  Pain : No/denies pain Pain Score: 0-No pain     BMI - recorded: 21.12 Nutritional Status: BMI of 19-24  Normal Nutritional Risks: None Diabetes: No  How often do you need to have someone help you when you read instructions, pamphlets, or other written materials from your doctor or pharmacy?: 1 - Never  Interpreter Needed?: No  Information entered by :: Abby Reece Fehnel, CMA   Activities of Daily Living    08/20/2023    1:59 PM  In your present state of health, do you have any difficulty performing the following activities:  Hearing? 0  Vision? 0  Difficulty concentrating or making decisions? 0  Walking or climbing stairs? 0  Dressing or bathing? 0  Doing errands, shopping? 0  Preparing Food and eating ? N  Using the Toilet? N  In the past six months, have you accidently leaked urine? N  Do you have problems with loss of bowel control? N  Managing your Medications? N  Managing your Finances? N  Housekeeping or managing your Housekeeping? N    Patient Care Team: Kerri Perches, MD as PCP - General Rourk, Gerrit Friends, MD as Consulting Physician (Gastroenterology) Lay, Faylene Million, MD (Psychiatry) Roma Kayser, MD as Consulting Physician  (Endocrinology) Daisy Lazar, DO as Consulting Physician (Optometry)  Indicate any recent Medical Services you may have received from other than [facility] providers in the past year (date may be approximate).     Assessment:   This is a routine wellness examination for [facility].  Hearing/Vision screen Hearing Screening - Comments:: Patient denies any hearing difficulties.   Vision Screening - Comments:: Patient is utd on yearly eye exams with [facility]   Dietary issues and exercise activities discussed:     Goals Addressed             This Visit's Progress    Patient Stated       Find a new place to live       Depression Screen    08/20/2023    1:53 PM 06/26/2023    1:42 PM 04/23/2023   11:08 AM 11/20/2022   11:28 AM 09/11/2022    9:37 AM 07/30/2022   11:43 AM 07/30/2022   11:39 AM  PHQ 2/9 Scores  PHQ - 2 Score 0 0 3 0 1 0 0  PHQ- 9 Score 2 3 13         Fall Risk    08/20/2023    1:59 PM 06/26/2023    1:42 PM 04/23/2023   11:07 AM 11/20/2022   11:28 AM 09/11/2022    9:37 AM  Fall Risk   Falls in the past year? 0 0 0 0 0  Number falls in past yr: 0 0 0 0 0  Injury with Fall? 0 0 0 0 0  Risk for fall due to : No Fall Risks No Fall Risks  No Fall Risks No Fall Risks  Follow up Falls prevention discussed Falls evaluation completed  Falls evaluation completed Falls evaluation completed    MEDICARE RISK AT HOME: Medicare Risk at Home Any stairs in or around the home?: No If so, are there any without handrails?: No  Home free of loose throw rugs in walkways, pet beds, electrical cords, etc?: Yes Adequate lighting in your home to reduce risk of falls?: Yes Life alert?: No Use of a cane, walker or w/c?: No Grab bars in the bathroom?: No Shower chair or bench in shower?: No Elevated toilet seat or a handicapped toilet?: No  TIMED UP AND GO:  Was the test performed?  No    Cognitive Function:        08/20/2023    1:50 PM 07/30/2022   11:44 AM 07/20/2021    2:51  PM 07/20/2021    2:46 PM 07/18/2020   10:15 AM  6CIT Screen  What Year? 0 points 0 points 0 points 0 points 0 points  What month? 0 points 0 points 0 points 0 points 0 points  What time? 0 points 0 points 0 points 0 points 0 points  Count back from 20 0 points 0 points 0 points 0 points 0 points  Months in reverse 0 points 0 points 0 points 0 points 0 points  Repeat phrase 0 points 2 points 0 points 0 points 0 points  Total Score 0 points 2 points 0 points 0 points 0 points    Immunizations Immunization History  Administered Date(s) Administered   Fluad Quad(high Dose 65+) 09/14/2019, 10/24/2020, 11/07/2021, 09/11/2022   Influenza Split 09/24/2012   Influenza Whole 10/04/2009, 09/29/2010, 09/17/2011   Influenza,inj,Quad PF,6+ Mos 10/29/2013, 10/11/2014, 09/20/2015, 08/20/2016, 09/17/2017, 09/18/2018   Moderna Covid-19 Vaccine Bivalent Booster 28yrs & up 11/13/2022   Moderna SARS-COV2 Booster Vaccination 07/04/2021, 11/21/2021   Moderna Sars-Covid-2 Vaccination 03/09/2020, 04/10/2020, 11/10/2020   Pneumococcal Conjugate-13 03/17/2015   Pneumococcal Polysaccharide-23 12/18/2016   Td 03/24/2010   Zoster Recombinant(Shingrix) 04/10/2023, 06/11/2023   Zoster, Live 09/20/2011    TDAP status: Due, Education has been provided regarding the importance of this vaccine. Advised may receive this vaccine at local pharmacy or Health Dept. Aware to provide a copy of the vaccination record if obtained from local pharmacy or Health Dept. Verbalized acceptance and understanding.  Flu Vaccine status: Due, Education has been provided regarding the importance of this vaccine. Advised may receive this vaccine at local pharmacy or Health Dept. Aware to provide a copy of the vaccination record if obtained from local pharmacy or Health Dept. Verbalized acceptance and understanding.  Pneumococcal vaccine status: Up to date  Covid-19 vaccine status: Information provided on how to obtain vaccines.    Qualifies for Shingles Vaccine? No   Zostavax completed Yes   Shingrix Completed?: Yes  Screening Tests Health Maintenance  Topic Date Due   DTaP/Tdap/Td (2 - Tdap) 03/24/2020   COVID-19 Vaccine (5 - 2023-24 season) 01/08/2023   Medicare Annual Wellness (AWV)  07/31/2023   INFLUENZA VACCINE  08/01/2023   MAMMOGRAM  01/03/2024   Colonoscopy  04/19/2024   Pneumonia Vaccine 83+ Years old  Completed   DEXA SCAN  Completed   Hepatitis C Screening  Completed   Zoster Vaccines- Shingrix  Completed   HPV VACCINES  Aged Out    Health Maintenance  Health Maintenance Due  Topic Date Due   DTaP/Tdap/Td (2 - Tdap) 03/24/2020   COVID-19 Vaccine (5 - 2023-24 season) 01/08/2023   Medicare Annual Wellness (AWV)  07/31/2023   INFLUENZA VACCINE  08/01/2023    Colorectal cancer screening: Type of screening: Colonoscopy. Completed 04/19/2014. Repeat every 10 years  Mammogram status: Completed 01/02/2023. Repeat every year  Bone Density status: Ordered 08/20/2023. Pt provided with contact info and advised to call  to schedule appt.  Lung Cancer Screening: (Low Dose CT Chest recommended if Age 104-80 years, 20 pack-year currently smoking OR have quit w/in 15years.) does not qualify.   Lung Cancer Screening Referral: n/a  Additional Screening:  Hepatitis C Screening: does not qualify; Completed 06/26/2023  Vision Screening: Recommended annual ophthalmology exams for early detection of glaucoma and other disorders of the eye. Is the patient up to date with their annual eye exam?  Yes  Who is the provider or what is the name of the office in which the patient attends annual eye exams? [facility] Jon[facility] Albee If pt is not established with a provider, would they like to be referred to a provider to establish care? No .   Dental Screening: Recommended annual dental exams for proper oral hygiene  Diabetic Foot Exam: n/a  Community Resource Referral / Chronic Care Management: CRR required this visit?   No   CCM required this visit?  No     Plan:     I have personally reviewed and noted the following in the patient's chart:   Medical and social history Use of alcohol, tobacco or illicit drugs  Current medications and supplements including opioid prescriptions. Patient is not currently taking opioid prescriptions. Functional ability and status Nutritional status Physical activity Advanced directives List of other physicians Hospitalizations, surgeries, and ER visits in previous 12 months Vitals Screenings to include cognitive, depression, and falls Referrals and appointments  In addition, I have reviewed and discussed with patient certain preventive protocols, quality metrics, and best practice recommendations. A written personalized care plan for preventive services as well as general preventive health recommendations were provided to patient.     Jordan Hawks Damontay Alred, CMA   08/20/2023   After Visit Summary: (Mail) Due to this being a telephonic visit, the after visit summary with patients personalized plan was offered to patient via mail   Nurse Notes: Dexa Scan ordered for patient today

## 2023-08-30 DIAGNOSIS — Z008 Encounter for other general examination: Secondary | ICD-10-CM | POA: Diagnosis not present

## 2023-08-30 DIAGNOSIS — N182 Chronic kidney disease, stage 2 (mild): Secondary | ICD-10-CM | POA: Diagnosis not present

## 2023-08-30 DIAGNOSIS — E032 Hypothyroidism due to medicaments and other exogenous substances: Secondary | ICD-10-CM | POA: Diagnosis not present

## 2023-08-30 DIAGNOSIS — Z6821 Body mass index (BMI) 21.0-21.9, adult: Secondary | ICD-10-CM | POA: Diagnosis not present

## 2023-10-03 ENCOUNTER — Encounter: Payer: Self-pay | Admitting: Family Medicine

## 2023-10-03 ENCOUNTER — Ambulatory Visit (INDEPENDENT_AMBULATORY_CARE_PROVIDER_SITE_OTHER): Payer: No Typology Code available for payment source | Admitting: Family Medicine

## 2023-10-03 ENCOUNTER — Other Ambulatory Visit: Payer: Self-pay

## 2023-10-03 VITALS — BP 129/77 | HR 76 | Ht 62.0 in | Wt 115.1 lb

## 2023-10-03 DIAGNOSIS — Z23 Encounter for immunization: Secondary | ICD-10-CM

## 2023-10-03 DIAGNOSIS — F5105 Insomnia due to other mental disorder: Secondary | ICD-10-CM

## 2023-10-03 DIAGNOSIS — R636 Underweight: Secondary | ICD-10-CM | POA: Diagnosis not present

## 2023-10-03 DIAGNOSIS — E890 Postprocedural hypothyroidism: Secondary | ICD-10-CM | POA: Diagnosis not present

## 2023-10-03 DIAGNOSIS — F409 Phobic anxiety disorder, unspecified: Secondary | ICD-10-CM | POA: Diagnosis not present

## 2023-10-03 DIAGNOSIS — F411 Generalized anxiety disorder: Secondary | ICD-10-CM

## 2023-10-03 MED ORDER — MIRTAZAPINE 7.5 MG PO TABS: 7.5000 mg | ORAL_TABLET | Freq: Every day | ORAL | 5 refills | Status: DC

## 2023-10-03 MED ORDER — BUSPIRONE HCL 5 MG PO TABS: 5.0000 mg | ORAL_TABLET | Freq: Two times a day (BID) | ORAL | 2 refills | Status: DC

## 2023-10-03 MED ORDER — MONTELUKAST SODIUM 10 MG PO TABS: ORAL_TABLET | ORAL | 5 refills | Status: DC

## 2023-10-03 NOTE — Patient Instructions (Signed)
F/U in 8 weeks, re evaluate anxiety, call if you need me sooner  Nw for anxiety is buspar 5 mg may take two times daily. Take at 11 am and 5 pm.   Flu vaccine today  Glad sleep is better and appetite   Thanks for choosing [facility], we consider it a privelige to serve you.

## 2023-10-04 NOTE — Assessment & Plan Note (Signed)
Improved and doing well on current medication, continue same

## 2023-10-04 NOTE — Assessment & Plan Note (Signed)
Managed by endo and adequately corrected

## 2023-10-04 NOTE — Assessment & Plan Note (Signed)
Corrected/ resolved

## 2023-10-04 NOTE — Assessment & Plan Note (Signed)
Start buspar twice daily and reassess in 10 to 12 weeks, practice behavioral techniques

## 2023-10-04 NOTE — Progress Notes (Signed)
   TRISTEN PENNINO     MRN: [id number]      DOB: 1951/02/17  Chief Complaint  Patient presents with   Anxiety    Anxiety getting worse more frequent, can't drive    HPI Ms. Osburn is here for follow up and re-evaluation of chronic medical conditions, medication management and review of any available recent lab and radiology data.  Preventive health is updated, specifically  Cancer screening and Immunization.   Questions or concerns regarding consultations or procedures which the PT has had in the interim are  addressed. The PT denies any adverse reactions to current medications since the last visit.  C/o worsened anxiety and has decided that she needs medication  ROS Denies recent fever or chills. Denies sinus pressure, nasal congestion, ear pain or sore throat. Denies chest congestion, productive cough or wheezing. Denies chest pains, palpitations and leg swelling Denies abdominal pain, nausea, vomiting,diarrhea or constipation.   Denies dysuria, frequency, hesitancy or incontinence. Denies joint pain, swelling and limitation in mobility. Denies headaches, seizures, numbness, or tingling. Denies uncontrolled  depression,  Denies skin break down or rash.   PE  BP 129/77 (BP Location: Right Arm, Patient Position: Sitting, Cuff Size: Normal)   Pulse 76   Ht 5\' 2"  (1.575 m)   Wt 115 lb 1.3 oz (52.2 kg)   SpO2 98%   BMI 21.05 kg/m   Patient alert and oriented and in no cardiopulmonary distress.  HEENT: No facial asymmetry, EOMI,     Neck supple .  Chest: Clear to auscultation bilaterally.  CVS: S1, S2 no murmurs, no S3.Regular rate.  ABD: Soft non tender.   Ext: No edema  MS: Adequate ROM spine, shoulders, hips and knees.  Skin: Intact, no ulcerations or rash noted.  Psych: Good eye contact, normal affect. Memory intact  anxious , pressure of speech,not  depressed appearing.  CNS: CN 2-12 intact, power,  normal throughout.no focal deficits noted.   Assessment &  Plan  Insomnia due to anxiety and fear Improved and doing well on current medication, continue same  Hypothyroidism following radioiodine therapy Managed by endo and adequately corrected  GAD (generalized anxiety disorder) Start buspar twice daily and reassess in 10 to 12 weeks, practice behavioral techniques   Underweight due to inadequate caloric intake Corrected/ resolved

## 2023-10-08 DIAGNOSIS — H524 Presbyopia: Secondary | ICD-10-CM | POA: Diagnosis not present

## 2023-11-18 DIAGNOSIS — E890 Postprocedural hypothyroidism: Secondary | ICD-10-CM | POA: Diagnosis not present

## 2023-11-18 DIAGNOSIS — M810 Age-related osteoporosis without current pathological fracture: Secondary | ICD-10-CM | POA: Diagnosis not present

## 2023-11-19 LAB — TSH: TSH: 1.14 u[IU]/mL (ref 0.450–4.500)

## 2023-11-19 LAB — COMPREHENSIVE METABOLIC PANEL
ALT: 21 [IU]/L (ref 0–32)
AST: 24 [IU]/L (ref 0–40)
Albumin: 4.7 g/dL (ref 3.8–4.8)
Alkaline Phosphatase: 113 [IU]/L (ref 44–121)
BUN/Creatinine Ratio: 16 (ref 12–28)
BUN: 13 mg/dL (ref 8–27)
Bilirubin Total: 0.4 mg/dL (ref 0.0–1.2)
CO2: 20 mmol/L (ref 20–29)
Calcium: 9.5 mg/dL (ref 8.7–10.3)
Chloride: 103 mmol/L (ref 96–106)
Creatinine, Ser: 0.79 mg/dL (ref 0.57–1.00)
Globulin, Total: 2.7 g/dL (ref 1.5–4.5)
Glucose: 81 mg/dL (ref 70–99)
Potassium: 4.5 mmol/L (ref 3.5–5.2)
Sodium: 140 mmol/L (ref 134–144)
Total Protein: 7.4 g/dL (ref 6.0–8.5)
eGFR: 79 mL/min/{1.73_m2} (ref >59)

## 2023-11-19 LAB — T4, FREE: Free T4: 1.32 ng/dL (ref 0.82–1.77)

## 2023-11-19 LAB — LIPID PANEL
Chol/HDL Ratio: 3 {ratio} (ref 0.0–4.4)
Cholesterol, Total: 193 mg/dL (ref 100–199)
HDL: 65 mg/dL (ref >39)
LDL Chol Calc (NIH): 113 mg/dL — ABNORMAL HIGH (ref 0–99)
Triglycerides: 81 mg/dL (ref 0–149)
VLDL Cholesterol Cal: 15 mg/dL (ref 5–40)

## 2023-12-02 ENCOUNTER — Ambulatory Visit (INDEPENDENT_AMBULATORY_CARE_PROVIDER_SITE_OTHER): Payer: No Typology Code available for payment source | Admitting: "Endocrinology

## 2023-12-02 ENCOUNTER — Other Ambulatory Visit (HOSPITAL_COMMUNITY): Payer: Self-pay | Admitting: Family Medicine

## 2023-12-02 ENCOUNTER — Encounter: Payer: Self-pay | Admitting: "Endocrinology

## 2023-12-02 VITALS — BP 124/68 | HR 72 | Ht 62.0 in | Wt 116.8 lb

## 2023-12-02 DIAGNOSIS — E890 Postprocedural hypothyroidism: Secondary | ICD-10-CM

## 2023-12-02 DIAGNOSIS — E782 Mixed hyperlipidemia: Secondary | ICD-10-CM

## 2023-12-02 DIAGNOSIS — M810 Age-related osteoporosis without current pathological fracture: Secondary | ICD-10-CM | POA: Diagnosis not present

## 2023-12-02 DIAGNOSIS — Z1231 Encounter for screening mammogram for malignant neoplasm of breast: Secondary | ICD-10-CM

## 2023-12-02 NOTE — Progress Notes (Signed)
12/02/2023     Endocrinology follow-up note   Subjective:    Patient ID: Megan Gibbs, female    DOB: 03-03-1951,    Past Medical History:  Diagnosis Date   Allergic rhinitis    Anemia    Anxiety    Back pain    Depression    Hypokalemia    Hypothyroidism    Osteoporosis    Prediabetes    Seasonal allergies    Seasonal allergies    Vertigo    Wears glasses    Past Surgical History:  Procedure Laterality Date   BACK SURGERY  1992 & 2009   Dr. Jeral Fruit    BREAST BIOPSY     BREAST EXCISIONAL BIOPSY     BREAST LUMPECTOMY WITH RADIOACTIVE SEED LOCALIZATION Right 12/01/2014   Procedure: RIGHT BREAST LUMPECTOMY WITH RADIOACTIVE SEED LOCALIZATION;  Surgeon: Claud Kelp, MD;  Location: [facility];  Service: General;  Laterality: Right;   CATARACT EXTRACTION W/PHACO Right 06/15/2022   Procedure: CATARACT EXTRACTION PHACO AND INTRAOCULAR LENS PLACEMENT (IOC);  Surgeon: Fabio Pierce, MD;  Location: [facility];  Service: Ophthalmology;  Laterality: Right;  CDE: 10.49   CATARACT EXTRACTION W/PHACO Left 07/09/2022   Procedure: CATARACT EXTRACTION PHACO AND INTRAOCULAR LENS PLACEMENT (IOC);  Surgeon: Fabio Pierce, MD;  Location: [facility];  Service: Ophthalmology;  Laterality: Left;  CDE: 9.15   COLONOSCOPY N/A 04/19/2014   Procedure: COLONOSCOPY;  Surgeon: Corbin Ade, MD;  Location: [facility];  Service: Endoscopy;  Laterality: N/A;  9:30 AM   DILATION AND CURETTAGE OF UTERUS     TUBAL LIGATION     1976   Social History   Socioeconomic History   Marital status: Divorced    Spouse name: Not on file   Number of children: 3   Years of education: Not on file   Highest education level: Not on file  Occupational History   Occupation: disabled     Employer: UNEMPLOYED  Tobacco Use   Smoking status: Never   Smokeless tobacco: Never  Vaping Use   Vaping status: Never Used  Substance and Sexual Activity   Alcohol use: No   Drug use: No   Sexual activity: Yes     Birth control/protection: Post-menopausal  Other Topics Concern   Not on file  Social History Narrative   DOING A LOT OF TRAVELING WITH HER CHURCH.    Social Determinants of Health   Financial Resource Strain: Low Risk  (08/20/2023)   Overall Financial Resource Strain (CARDIA)    Difficulty of Paying Living Expenses: Not hard at all  Food Insecurity: No Food Insecurity (08/20/2023)   Hunger Vital Sign    Worried About Running Out of Food in the Last Year: Never true    Ran Out of Food in the Last Year: Never true  Transportation Needs: No Transportation Needs (08/20/2023)   PRAPARE - Administrator, Civil Service (Medical): No    Lack of Transportation (Non-Medical): No  Physical Activity: Inactive (08/20/2023)   Exercise Vital Sign    Days of Exercise per Week: 0 days    Minutes of Exercise per Session: 0 min  Stress: No Stress Concern Present (08/20/2023)   [employer] of Occupational Health - Occupational Stress Questionnaire    Feeling of Stress : Not at all  Social Connections: Moderately Integrated (08/20/2023)   Social Connection and Isolation Panel [NHANES]    Frequency of Communication with Friends and Family: More than three times a  week    Frequency of Social Gatherings with Friends and Family: More than three times a week    Attends Religious Services: More than 4 times per year    Active Member of Clubs or Organizations: Yes    Attends Banker Meetings: More than 4 times per year    Marital Status: Divorced   Outpatient Encounter Medications as of 12/02/2023  Medication Sig   alendronate (FOSAMAX) 70 MG tablet Take with a full glass of water on an empty stomach.   busPIRone (BUSPAR) 5 MG tablet Take 1 tablet (5 mg total) by mouth 2 (two) times daily.   Calcium Carb-Cholecalciferol (OYSTER SHELL CALCIUM W/D) 500-5 MG-MCG TABS TAKE (1) TABLET TWICE DAILY.   levothyroxine (SYNTHROID) 50 MCG tablet Take 1 tablet (50 mcg total) by mouth daily  before breakfast.   mirtazapine (REMERON) 7.5 MG tablet Take 1 tablet (7.5 mg total) by mouth at bedtime.   montelukast (SINGULAIR) 10 MG tablet Take one tablet by mouth once daily , as needed, for allergies   Multiple Vitamins-Minerals (MULTIVITAMIN ADULTS 50+ PO) Take 1 tablet by mouth daily.   omega-3 fish oil (MAXEPA) 1000 MG CAPS capsule Take 2 capsules by mouth daily.    No facility-administered encounter medications on file as of 12/02/2023.   ALLERGIES: No Known Allergies VACCINATION STATUS: Immunization History  Administered Date(s) Administered   Fluad Quad(high Dose 65+) 09/14/2019, 10/24/2020, 11/07/2021, 09/11/2022   Fluad Trivalent(High Dose 65+) 10/03/2023   Influenza Split 09/24/2012   Influenza Whole 10/04/2009, 09/29/2010, 09/17/2011   Influenza,inj,Quad PF,6+ Mos 10/29/2013, 10/11/2014, 09/20/2015, 08/20/2016, 09/17/2017, 09/18/2018   Moderna Covid-19 Vaccine Bivalent Booster 55yrs & up 11/13/2022   Moderna SARS-COV2 Booster Vaccination 07/04/2021, 11/21/2021   Moderna Sars-Covid-2 Vaccination 03/09/2020, 04/10/2020, 11/10/2020   Pneumococcal Conjugate-13 03/17/2015   Pneumococcal Polysaccharide-23 12/18/2016   Td 03/24/2010   Zoster Recombinant(Shingrix) 04/10/2023, 06/11/2023   Zoster, Live 09/20/2011    HPI  72 yr old female with medical hx as above.  She is following in this clinic for management of osteoporosis, hypothyroidism.  She is on Fosamax 70 mg p.o. weekly since 2015.  She has no complaints regarding her treatment for osteoporosis.   She has had lumbar disc prolapse for which she underwent surgery in 2002 and 2009. Her last bone density was in December 2022. Recently she was seen due to abnormal thyroid function tests.  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  She was given RAI thyroid ablation which helped her symptoms resolve and patient presents with significant clinical  improvement.  She is currently on levothyroxine 50 mcg p.o. daily before breakfast for RAI induced hypothyroidism.  Her previsit thyroid function tests are consistent with appropriate replacement.    She returns with continued clinical improvement   See notes from previous visits.  She has mildly fluctuating body weight.  She has not made a change in her diet.  Her previsit labs still show significant dyslipidemia.       She denies dysphagia, shortness of breath, nor voice change. She  has 3 grown children. she denies parathyroid, thyroid dysfunction.  She denies history of fragility fracture.  She remains on vitamin D and low-dose calcium supplement. Review of her medical records also indicates hyperlipidemia not on treatment.  Her recent labs shows that her dyslipidemia is worsening, patient still does not want to take medications.  Review of Systems Limited as above.  Objective:    BP 124/68   Pulse  72   Ht 5\' 2"  (1.575 m)   Wt 116 lb 12.8 oz (53 kg)   BMI 21.36 kg/m   Wt Readings from Last 3 Encounters:  12/02/23 116 lb 12.8 oz (53 kg)  10/03/23 115 lb 1.3 oz (52.2 kg)  08/20/23 111 lb 12.8 oz (50.7 kg)    Physical Exam     Results for orders placed or performed in visit on 05/30/23  TSH  Result Value Ref Range   TSH 1.140 0.450 - 4.500 uIU/mL  T4, free  Result Value Ref Range   Free T4 1.32 0.82 - 1.77 ng/dL  Lipid panel  Result Value Ref Range   Cholesterol, Total 193 100 - 199 mg/dL   Triglycerides 81 0 - 149 mg/dL   HDL 65 >56 mg/dL   VLDL Cholesterol Cal 15 5 - 40 mg/dL   LDL Chol Calc (NIH) 213 (H) 0 - 99 mg/dL   Chol/HDL Ratio 3.0 0.0 - 4.4 ratio  Comprehensive metabolic panel  Result Value Ref Range   Glucose 81 70 - 99 mg/dL   BUN 13 8 - 27 mg/dL   Creatinine, Ser 0.86 0.57 - 1.00 mg/dL   eGFR 79 >57 QI/ONG/2.95   BUN/Creatinine Ratio 16 12 - 28   Sodium 140 134 - 144 mmol/L   Potassium 4.5 3.5 - 5.2 mmol/L   Chloride 103 96 - 106 mmol/L   CO2  20 20 - 29 mmol/L   Calcium 9.5 8.7 - 10.3 mg/dL   Total Protein 7.4 6.0 - 8.5 g/dL   Albumin 4.7 3.8 - 4.8 g/dL   Globulin, Total 2.7 1.5 - 4.5 g/dL   Bilirubin Total 0.4 0.0 - 1.2 mg/dL   Alkaline Phosphatase 113 44 - 121 IU/L   AST 24 0 - 40 IU/L   ALT 21 0 - 32 IU/L   Complete Blood Count (Most recent): Lab Results  Component Value Date   WBC 4.4 04/16/2023   HGB 12.2 04/16/2023   HCT 37.9 04/16/2023   MCV 83 04/16/2023   PLT 238 04/16/2023   Chemistry (most recent): Lab Results  Component Value Date   NA 140 11/18/2023   K 4.5 11/18/2023   CL 103 11/18/2023   CO2 20 11/18/2023   BUN 13 11/18/2023   CREATININE 0.79 11/18/2023   Diabetic Labs (most recent): Lab Results  Component Value Date   HGBA1C 5.5 12/11/2016   HGBA1C 5.6 12/12/2015   HGBA1C 5.6 06/21/2015   Lipid Panel     Component Value Date/Time   CHOL 193 11/18/2023 1009   TRIG 81 11/18/2023 1009   HDL 65 11/18/2023 1009   CHOLHDL 3.0 11/18/2023 1009   CHOLHDL 3.1 03/07/2020 0952   VLDL 17 04/17/2017 0958   LDLCALC 113 (H) 11/18/2023 1009   LDLCALC 105 (H) 03/07/2020 0952   LABVLDL 15 11/18/2023 1009     Assessment & Plan:  1.  RAI induced hypothyroidism She is status post RAI thyroid ablation for hyper functioning nodule in the left lobe of her thyroid. -Her thyroid function tests are consistent with appropriate replacement.  She is advised to continue levothyroxine to 50 mcg p.o. daily before breakfast.    - We discussed about the correct intake of her thyroid hormone, on empty stomach at fasting, with water, separated by at least 30 minutes from breakfast and other medications,  and separated by more than 4 hours from calcium, iron, multivitamins, acid reflux medications (PPIs). -Patient is made aware of the fact that thyroid  hormone replacement is needed for life, dose to be adjusted by periodic monitoring of thyroid function tests.    2. 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  Previsit DEXA scan from May 17, 2020 showed continued progressive improvement compared to her last DEXA scan readings from 2011-2021 in spine, hips, and femuri. She is tolerating Fosamax, advised to continue Fosamax 70 mg weekly.   She is advised on side effects and precautions. She was started on Fosamax in 2015. -She has no recent fragility fracture.  She will have repeat, surveillance bone density before her next visit.    Her PTH is normal along with calcium and TFTs, ruling out secondary cause of osteoporosis. She is made aware of the possibility of treating her with Fosamax for 5 to 8 years before considering drug holiday.   Risk of untreated osteoporosis is discussed with her in detail.   3) dyslipidemia: Not on medications.  She has tried to make some changes in her lifestyle.  Her previsit fasting lipid panel showed LDL at 113.  She is still wishes to avoid statin medication at this time.   - she acknowledges that there is a room for improvement in her food and drink choices. - Suggestion is made for her to avoid simple carbohydrates  from her diet including Cakes, Sweet Desserts, Ice Cream, Soda (diet and regular), Sweet Tea, Candies, Chips, Cookies, Store Bought Juices, Alcohol , Artificial Sweeteners,  Coffee Creamer, and "Sugar-free" Products, Lemonade. This will help patient to have more stable blood glucose profile and potentially avoid unintended weight gain.  The following Lifestyle Medicine recommendations according to American College of Lifestyle Medicine  [facility]) were discussed and and offered to patient and she  agrees to start the journey:  A. Whole Foods, Plant-Based Nutrition comprising of fruits and vegetables, plant-based proteins, whole-grain carbohydrates was discussed in detail with the patient.   A list for source of those nutrients were also provided to the patient.  Patient will use only water or unsweetened tea for hydration. B.  The  need to stay away from risky substances including alcohol, smoking; obtaining 7 to 9 hours of restorative sleep, at least 150 minutes of moderate intensity exercise weekly, the importance of healthy social connections,  and stress management techniques were discussed. C.  A full color page of  Calorie density of various food groups per pound showing examples of each food groups was provided to the patient.     4.  Vitamin D deficiency -She is now vitamin D replete at 50.4-  She is advised to maintain with vitamin D3 600/200 mg p.o. daily.  I advised patient to maintain close follow up with their PCP for primary care needs.    I spent  27  minutes in the care of the patient today including review of labs from Thyroid Function, CMP, and other relevant labs ; imaging/biopsy records (current and previous including abstractions from other facilities); face-to-face time discussing  her lab results and symptoms, medications doses, her options of short and long term treatment based on the latest standards of care / guidelines;   and documenting the encounter.  Megan Gibbs  participated in the discussions, expressed understanding, and voiced agreement with the above plans.  All questions were answered to her satisfaction. she is encouraged to contact clinic should she have any questions or concerns prior to her return visit.     Follow up plan: Return in about 6 months (around  06/01/2024) for Fasting Labs  in AM B4 8, DXA Scan B4 NV.  Marquis Lunch, MD Phone: [phone]  Fax: [phone]  -  This note was partially dictated with voice recognition software. Similar sounding words can be transcribed inadequately or may not  be corrected upon review.  12/02/2023, 4:31 PM

## 2023-12-05 ENCOUNTER — Ambulatory Visit (INDEPENDENT_AMBULATORY_CARE_PROVIDER_SITE_OTHER): Payer: No Typology Code available for payment source | Admitting: Family Medicine

## 2023-12-05 ENCOUNTER — Encounter: Payer: Self-pay | Admitting: Family Medicine

## 2023-12-05 VITALS — BP 134/80 | HR 71 | Ht 62.0 in | Wt 116.1 lb

## 2023-12-05 DIAGNOSIS — E782 Mixed hyperlipidemia: Secondary | ICD-10-CM | POA: Diagnosis not present

## 2023-12-05 DIAGNOSIS — Z0000 Encounter for general adult medical examination without abnormal findings: Secondary | ICD-10-CM | POA: Diagnosis not present

## 2023-12-05 DIAGNOSIS — J3089 Other allergic rhinitis: Secondary | ICD-10-CM

## 2023-12-05 DIAGNOSIS — E890 Postprocedural hypothyroidism: Secondary | ICD-10-CM | POA: Diagnosis not present

## 2023-12-05 DIAGNOSIS — E559 Vitamin D deficiency, unspecified: Secondary | ICD-10-CM | POA: Diagnosis not present

## 2023-12-05 DIAGNOSIS — E0590 Thyrotoxicosis, unspecified without thyrotoxic crisis or storm: Secondary | ICD-10-CM

## 2023-12-05 MED ORDER — AZELASTINE HCL 0.1 % NA SOLN: 2.0000 | Freq: Two times a day (BID) | NASAL | 3 refills | Status: DC

## 2023-12-05 NOTE — Assessment & Plan Note (Signed)
Annual exam as documented. . Immunization and cancer screening needs are specifically addressed at this visit.  

## 2023-12-05 NOTE — Progress Notes (Signed)
    Megan Gibbs     MRN: [id number]      DOB: 16-Sep-1951  Chief Complaint  Patient presents with   Annual Exam    HPI: Patient is in for annual physical exam. C/O uncontrolled allergy symptoms, congestion and drainage Recent labs,  are reviewed. Immunization is reviewed , and  updated if needed.   PE: BP 134/80 (BP Location: Right Arm, Patient Position: Sitting, Cuff Size: Normal)   Pulse 71   Ht 5\' 2"  (1.575 m)   Wt 116 lb 1.9 oz (52.7 kg)   SpO2 97%   BMI 21.24 kg/m   Pleasant  female, alert and oriented x 3, in no cardio-pulmonary distress. Afebrile. HEENT No facial trauma or asymetry. Sinuses non tender.Nasal congestion , TM clear , watery drainage from both eyes, no  conjunctival injection/ erythema  Extra occullar muscles intact.. External ears normal, . Neck: supple, no adenopathy,JVD or thyromegaly.No bruits.  Chest: Clear to ascultation bilaterally.No crackles or wheezes. Non tender to palpation   Cardiovascular system; Heart sounds normal,  S1 and  S2 ,no S3.  No murmur, or thrill. Apical beat not displaced Peripheral pulses normal.  Abdomen: Soft, non tender, no organomegaly or masses. No bruits. Bowel sounds normal. No guarding, tenderness or rebound.    Musculoskeletal exam: Full ROM of spine, hips , shoulders and knees. No deformity ,swelling or crepitus noted. No muscle wasting or atrophy.   Neurologic: Cranial nerves 2 to 12 intact. Power, tone ,sensation and reflexes normal throughout. No disturbance in gait. No tremor.  Skin: Intact, no ulceration, erythema , scaling or rash noted. Pigmentation normal throughout  Psych; Normal mood and affect. Judgement and concentration normal   Assessment & Plan:  Encounter for annual physical exam Annual exam as documented.  Immunization and cancer screening needs are specifically addressed at this visit.   Annual visit for general adult medical examination without abnormal  findings Annual exam as documented.  Immunization and cancer screening needs are specifically addressed at this visit.   Allergic rhinitis Currently uncontrolled , start daily astellin

## 2023-12-05 NOTE — Patient Instructions (Signed)
Follow-up in May, call if you need me sooner.  Your colonoscopy is due in April and you should get a call from your GI doctors office.  Mammogram is already scheduled for January please follow through.  You need Tdap vaccine as well as COVID-vaccine.  Please get these at your pharmacy.  No changes in medication at this time.  Fasting lipid CMP and EGFR CBC and vitamin D levels to be done 3 to 5 days before your follow-up with me.  Best for 2025.  Continue great health habits.  Continue current medications.  Nasal spray is sent in for allergy symptoms.  Thanks for choosing [facility], we consider it a privelige to serve you.

## 2023-12-09 NOTE — Assessment & Plan Note (Signed)
Currently uncontrolled , start daily astellin

## 2023-12-16 ENCOUNTER — Other Ambulatory Visit: Payer: Self-pay | Admitting: "Endocrinology

## 2024-01-06 ENCOUNTER — Other Ambulatory Visit (HOSPITAL_COMMUNITY): Payer: No Typology Code available for payment source

## 2024-01-08 ENCOUNTER — Other Ambulatory Visit (HOSPITAL_COMMUNITY): Payer: No Typology Code available for payment source

## 2024-01-08 ENCOUNTER — Ambulatory Visit (HOSPITAL_COMMUNITY)
Admission: RE | Admit: 2024-01-08 | Discharge: 2024-01-08 | Disposition: A | Discharge status: Home / Self Care | Payer: No Typology Code available for payment source | Source: Ambulatory Visit | Attending: Family Medicine | Admitting: Family Medicine

## 2024-01-08 DIAGNOSIS — Z1231 Encounter for screening mammogram for malignant neoplasm of breast: Secondary | ICD-10-CM | POA: Insufficient documentation

## 2024-02-18 ENCOUNTER — Other Ambulatory Visit: Payer: Self-pay | Admitting: Family Medicine

## 2024-03-03 DIAGNOSIS — N182 Chronic kidney disease, stage 2 (mild): Secondary | ICD-10-CM | POA: Diagnosis not present

## 2024-03-03 DIAGNOSIS — F321 Major depressive disorder, single episode, moderate: Secondary | ICD-10-CM | POA: Diagnosis not present

## 2024-03-03 DIAGNOSIS — Z008 Encounter for other general examination: Secondary | ICD-10-CM | POA: Diagnosis not present

## 2024-03-03 DIAGNOSIS — E785 Hyperlipidemia, unspecified: Secondary | ICD-10-CM | POA: Diagnosis not present

## 2024-03-03 DIAGNOSIS — E039 Hypothyroidism, unspecified: Secondary | ICD-10-CM | POA: Diagnosis not present

## 2024-03-03 DIAGNOSIS — F411 Generalized anxiety disorder: Secondary | ICD-10-CM | POA: Diagnosis not present

## 2024-03-03 DIAGNOSIS — M810 Age-related osteoporosis without current pathological fracture: Secondary | ICD-10-CM | POA: Diagnosis not present

## 2024-03-19 ENCOUNTER — Other Ambulatory Visit: Payer: Self-pay | Admitting: "Endocrinology

## 2024-03-19 ENCOUNTER — Other Ambulatory Visit: Payer: Self-pay | Admitting: Family Medicine

## 2024-04-20 ENCOUNTER — Other Ambulatory Visit: Payer: Self-pay | Admitting: Family Medicine

## 2024-04-21 ENCOUNTER — Encounter: Payer: Self-pay | Admitting: *Deleted

## 2024-04-28 DIAGNOSIS — E559 Vitamin D deficiency, unspecified: Secondary | ICD-10-CM | POA: Diagnosis not present

## 2024-04-28 DIAGNOSIS — E782 Mixed hyperlipidemia: Secondary | ICD-10-CM | POA: Diagnosis not present

## 2024-04-28 DIAGNOSIS — Z0000 Encounter for general adult medical examination without abnormal findings: Secondary | ICD-10-CM | POA: Diagnosis not present

## 2024-04-29 LAB — LIPID PANEL
Chol/HDL Ratio: 3.2 ratio (ref 0.0–4.4)
Cholesterol, Total: 200 mg/dL — ABNORMAL HIGH (ref 100–199)
HDL: 62 mg/dL (ref >39)
LDL Chol Calc (NIH): 118 mg/dL — ABNORMAL HIGH (ref 0–99)
Triglycerides: 113 mg/dL (ref 0–149)
VLDL Cholesterol Cal: 20 mg/dL (ref 5–40)

## 2024-04-29 LAB — CMP14+EGFR
ALT: 17 IU/L (ref 0–32)
AST: 19 IU/L (ref 0–40)
Albumin: 4.5 g/dL (ref 3.8–4.8)
Alkaline Phosphatase: 114 IU/L (ref 44–121)
BUN/Creatinine Ratio: 16 (ref 12–28)
BUN: 12 mg/dL (ref 8–27)
Bilirubin Total: 0.3 mg/dL (ref 0.0–1.2)
CO2: 20 mmol/L (ref 20–29)
Calcium: 9.5 mg/dL (ref 8.7–10.3)
Chloride: 105 mmol/L (ref 96–106)
Creatinine, Ser: 0.77 mg/dL (ref 0.57–1.00)
Globulin, Total: 2.4 g/dL (ref 1.5–4.5)
Glucose: 80 mg/dL (ref 70–99)
Potassium: 4.5 mmol/L (ref 3.5–5.2)
Sodium: 140 mmol/L (ref 134–144)
Total Protein: 6.9 g/dL (ref 6.0–8.5)
eGFR: 82 mL/min/{1.73_m2} (ref >59)

## 2024-04-29 LAB — CBC
Hematocrit: 38.7 % (ref 34.0–46.6)
Hemoglobin: 12.5 g/dL (ref 11.1–15.9)
MCH: 27.2 pg (ref 26.6–33.0)
MCHC: 32.3 g/dL (ref 31.5–35.7)
MCV: 84 fL (ref 79–97)
Platelets: 229 10*3/uL (ref 150–450)
RBC: 4.59 x10E6/uL (ref 3.77–5.28)
RDW: 13.6 % (ref 11.7–15.4)
WBC: 4.5 10*3/uL (ref 3.4–10.8)

## 2024-04-29 LAB — VITAMIN D 25 HYDROXY (VIT D DEFICIENCY, FRACTURES): Vit D, 25-Hydroxy: 65.6 ng/mL (ref 30.0–100.0)

## 2024-05-05 ENCOUNTER — Encounter: Payer: Self-pay | Admitting: Family Medicine

## 2024-05-05 ENCOUNTER — Ambulatory Visit (INDEPENDENT_AMBULATORY_CARE_PROVIDER_SITE_OTHER): Payer: No Typology Code available for payment source | Admitting: Family Medicine

## 2024-05-05 VITALS — BP 126/73 | HR 83 | Resp 18 | Ht 62.0 in | Wt 116.1 lb

## 2024-05-05 DIAGNOSIS — R630 Anorexia: Secondary | ICD-10-CM

## 2024-05-05 DIAGNOSIS — F5105 Insomnia due to other mental disorder: Secondary | ICD-10-CM | POA: Diagnosis not present

## 2024-05-05 DIAGNOSIS — F411 Generalized anxiety disorder: Secondary | ICD-10-CM

## 2024-05-05 DIAGNOSIS — E890 Postprocedural hypothyroidism: Secondary | ICD-10-CM

## 2024-05-05 DIAGNOSIS — F409 Phobic anxiety disorder, unspecified: Secondary | ICD-10-CM

## 2024-05-05 DIAGNOSIS — J3089 Other allergic rhinitis: Secondary | ICD-10-CM | POA: Diagnosis not present

## 2024-05-05 MED ORDER — BUSPIRONE HCL 5 MG PO TABS: 5.0000 mg | ORAL_TABLET | Freq: Two times a day (BID) | ORAL | 5 refills | Status: DC

## 2024-05-05 MED ORDER — MONTELUKAST SODIUM 10 MG PO TABS: ORAL_TABLET | ORAL | 5 refills | Status: DC

## 2024-05-05 MED ORDER — MIRTAZAPINE 7.5 MG PO TABS: 7.5000 mg | ORAL_TABLET | Freq: Every day | ORAL | 5 refills | Status: DC

## 2024-05-05 NOTE — Patient Instructions (Addendum)
 Annual exam 12/08/2024 or after, call if you need me sooner  Follow up on colonoscopy , you need it  No medication changes  No labs needed for next visit  Keep appointments for bone density  and with Dr Monte Antonio  Thanks for choosing [facility], we consider it a privelige to serve you.'

## 2024-05-06 ENCOUNTER — Encounter: Payer: Self-pay | Admitting: Family Medicine

## 2024-05-06 NOTE — Assessment & Plan Note (Signed)
 Managed by Endo, stable and controlled

## 2024-05-06 NOTE — Assessment & Plan Note (Addendum)
 Improved , weight stable and in normal range

## 2024-05-06 NOTE — Progress Notes (Signed)
   Megan Gibbs     MRN: [id number]      DOB: 1951/01/08  Chief Complaint  Patient presents with   Medical Management of Chronic Issues    5 month follow up     HPI [name] is here for follow up and re-evaluation of chronic medical conditions, medication management and review of any available recent lab and radiology data.  Preventive health is updated, specifically  Cancer screening and Immunization.   Questions or concerns regarding consultations or procedures which the PT has had in the interim are  addressed. The PT denies any adverse reactions to current medications since the last visit. States her anxiety and insomnia are well controled on current regime as is her appetite C/o uncontrolled allergy symptoms which she attributes to a dirty carpet and is trying to move  ROS Denies recent fever or chills. Denies sinus pressure, n, ear pain or sore throat. Denies chest congestion, productive cough or wheezing. Denies chest pains, palpitations and leg swelling Denies abdominal pain, nausea, vomiting,diarrhea or constipation.   Denies dysuria, frequency, hesitancy or incontinence. Denies joint pain, swelling and limitation in mobility. Denies headaches, seizures, numbness, or tingling. Denies depression, uncontrolled anxiety or insomnia. Denies skin break down or rash.   PE  BP 126/73   Pulse 83   Resp 18   Ht 5\' 2"  (1.575 m)   Wt 116 lb 1.3 oz (52.7 kg)   SpO2 96%   BMI 21.23 kg/m   Patient alert and oriented and in no cardiopulmonary distress.  HEENT: No facial asymmetry, EOMI,     Neck supple .  Chest: Clear to auscultation bilaterally.  CVS: S1, S2 no murmurs, no S3.Regular rate.  ABD: Soft non tender.   Ext: No edema  MS: Adequate ROM spine, shoulders, hips and knees.  Skin: Intact, no ulcerations or rash noted.  Psych: Good eye contact, normal affect. Memory intact not anxious or depressed appearing.  CNS: CN 2-12 intact, power,  normal throughout.no focal  deficits noted.   Assessment & Plan  Insomnia due to anxiety and fear Controlled, no change in medication Sleep hygiene reviewed  Prescription sent for  medication needed.   GAD (generalized anxiety disorder) Controlled, no change in medication   Allergic rhinitis Continue current medical regime, sub optimal copntrol triggered by environment  Hypothyroidism following radioiodine therapy Managed by Endo, stable and controlled  Poor appetite Improved , weight stable and in normal range

## 2024-05-06 NOTE — Assessment & Plan Note (Signed)
Controlled, no change in medication ?Sleep hygiene reviewed  ?Prescription sent for  medication needed. ? ?

## 2024-05-06 NOTE — Assessment & Plan Note (Signed)
 Continue current medical regime, sub optimal copntrol triggered by environment

## 2024-05-06 NOTE — Assessment & Plan Note (Signed)
 Controlled, no change in medication

## 2024-05-11 ENCOUNTER — Telehealth: Payer: Self-pay | Admitting: *Deleted

## 2024-05-11 NOTE — Telephone Encounter (Signed)
  Procedure: COLONOSCOPY  Estimated body mass index is 21.23 kg/m as calculated from the following:   Height as of 05/05/24: 5\' 2"  (1.575 m).   Weight as of 05/05/24: 116 lb 1.3 oz (52.7 kg).   Have you had a colonoscopy before?  2015, Dr. Riley Cheadle  Do you have family history of colon cancer?  no  Do you have a family history of polyps? yes  Previous colonoscopy with polyps removed? no  Do you have a history colorectal cancer?   no  Are you diabetic?  no  Do you have a prosthetic or mechanical heart valve? no  Do you have a pacemaker/defibrillator?   no  Have you had endocarditis/atrial fibrillation?  no  Do you use supplemental oxygen/CPAP?  no  Have you had joint replacement within the last 12 months?  no  Do you tend to be constipated or have to use laxatives?  no   Do you have history of alcohol use? If yes, how much and how often.  no  Do you have history or are you using drugs? If yes, what do are you  using?  no  Have you ever had a stroke/heart attack?    Have you ever had a heart or other vascular stent placed,?  Do you take weight loss medication? no  female patients,: have you had a hysterectomy? no                              are you post menopausal?  yes                              do you still have your menstrual cycle? no    Date of last menstrual period?   Do you take any blood-thinning medications such as: (Plavix, aspirin, Coumadin, Aggrenox, Brilinta, Xarelto, Eliquis, Pradaxa, Savaysa or Effient)? no  If yes we need the name, milligram, dosage and who is prescribing doctor:               Current Outpatient Medications  Medication Sig Dispense Refill   alendronate  (FOSAMAX ) 70 MG tablet Take with a full glass of water  on an empty stomach. 12 tablet 3   azelastine  (ASTELIN ) 0.1 % nasal spray Place 2 sprays into both nostrils 2 (two) times daily. Use in each nostril as directed 30 mL 3   busPIRone  (BUSPAR ) 5 MG tablet Take 1 tablet (5 mg total) by  mouth 2 (two) times daily. 60 tablet 5   Calcium  Carb-Cholecalciferol (OYSTER SHELL CALCIUM  W/D) 500-5 MG-MCG TABS TAKE (1) TABLET TWICE DAILY. 60 tablet 0   levothyroxine  (SYNTHROID ) 50 MCG tablet take 1 tablet (50 microgram total) by mouth daily before breakfast. 90 tablet 0   mirtazapine  (REMERON ) 7.5 MG tablet Take 1 tablet (7.5 mg total) by mouth at bedtime. 30 tablet 5   montelukast  (SINGULAIR ) 10 MG tablet Take one tablet by mouth once daily , as needed, for allergies 30 tablet 5   Multiple Vitamins-Minerals (MULTIVITAMIN ADULTS 50+ PO) Take 1 tablet by mouth daily.     omega-3 fish oil (MAXEPA) 1000 MG CAPS capsule Take 2 capsules by mouth daily.      No current facility-administered medications for this visit.    No Known Allergies

## 2024-05-22 NOTE — Telephone Encounter (Signed)
ASA 2.  ?

## 2024-05-26 ENCOUNTER — Ambulatory Visit (HOSPITAL_COMMUNITY)
Admission: RE | Admit: 2024-05-26 | Discharge: 2024-05-26 | Disposition: A | Discharge status: Home / Self Care | Payer: No Typology Code available for payment source | Source: Ambulatory Visit | Attending: "Endocrinology | Admitting: "Endocrinology

## 2024-05-26 DIAGNOSIS — E782 Mixed hyperlipidemia: Secondary | ICD-10-CM | POA: Diagnosis not present

## 2024-05-26 DIAGNOSIS — M810 Age-related osteoporosis without current pathological fracture: Secondary | ICD-10-CM | POA: Diagnosis not present

## 2024-05-26 DIAGNOSIS — E890 Postprocedural hypothyroidism: Secondary | ICD-10-CM | POA: Diagnosis not present

## 2024-05-26 DIAGNOSIS — Z780 Asymptomatic menopausal state: Secondary | ICD-10-CM | POA: Diagnosis not present

## 2024-05-27 LAB — COMPREHENSIVE METABOLIC PANEL WITH GFR
ALT: 20 IU/L (ref 0–32)
AST: 27 IU/L (ref 0–40)
Albumin: 4.8 g/dL (ref 3.8–4.8)
Alkaline Phosphatase: 122 IU/L — ABNORMAL HIGH (ref 44–121)
BUN/Creatinine Ratio: 16 (ref 12–28)
BUN: 13 mg/dL (ref 8–27)
Bilirubin Total: 0.3 mg/dL (ref 0.0–1.2)
CO2: 20 mmol/L (ref 20–29)
Calcium: 9.7 mg/dL (ref 8.7–10.3)
Chloride: 105 mmol/L (ref 96–106)
Creatinine, Ser: 0.79 mg/dL (ref 0.57–1.00)
Globulin, Total: 2.7 g/dL (ref 1.5–4.5)
Glucose: 87 mg/dL (ref 70–99)
Potassium: 4.4 mmol/L (ref 3.5–5.2)
Sodium: 141 mmol/L (ref 134–144)
Total Protein: 7.5 g/dL (ref 6.0–8.5)
eGFR: 79 mL/min/{1.73_m2} (ref >59)

## 2024-05-27 LAB — LIPID PANEL
Chol/HDL Ratio: 3.4 ratio (ref 0.0–4.4)
Cholesterol, Total: 218 mg/dL — ABNORMAL HIGH (ref 100–199)
HDL: 64 mg/dL (ref >39)
LDL Chol Calc (NIH): 134 mg/dL — ABNORMAL HIGH (ref 0–99)
Triglycerides: 116 mg/dL (ref 0–149)
VLDL Cholesterol Cal: 20 mg/dL (ref 5–40)

## 2024-05-27 LAB — TSH: TSH: 2.94 u[IU]/mL (ref 0.450–4.500)

## 2024-05-27 LAB — T4, FREE: Free T4: 1.19 ng/dL (ref 0.82–1.77)

## 2024-05-27 NOTE — Telephone Encounter (Signed)
 Pt called back. Decided to go ahead with 6/26 with Dr. Riley Cheadle. Aware will call once we have her prep sample/instructions ready for pick up at [address].

## 2024-05-27 NOTE — Telephone Encounter (Signed)
 Questionnaire from recall, no referral needed

## 2024-05-27 NOTE — Telephone Encounter (Signed)
 Spoke with pt and she could not do date available in June. Will call once we get July schedule

## 2024-06-01 ENCOUNTER — Encounter: Payer: Self-pay | Admitting: "Endocrinology

## 2024-06-01 ENCOUNTER — Ambulatory Visit (INDEPENDENT_AMBULATORY_CARE_PROVIDER_SITE_OTHER): Payer: No Typology Code available for payment source | Admitting: "Endocrinology

## 2024-06-01 VITALS — BP 120/74 | HR 72 | Ht 62.0 in | Wt 115.8 lb

## 2024-06-01 DIAGNOSIS — M810 Age-related osteoporosis without current pathological fracture: Secondary | ICD-10-CM | POA: Diagnosis not present

## 2024-06-01 DIAGNOSIS — Z5320 Procedure and treatment not carried out because of patient's decision for unspecified reasons: Secondary | ICD-10-CM

## 2024-06-01 DIAGNOSIS — E890 Postprocedural hypothyroidism: Secondary | ICD-10-CM | POA: Diagnosis not present

## 2024-06-01 DIAGNOSIS — E559 Vitamin D deficiency, unspecified: Secondary | ICD-10-CM | POA: Diagnosis not present

## 2024-06-01 DIAGNOSIS — E782 Mixed hyperlipidemia: Secondary | ICD-10-CM | POA: Diagnosis not present

## 2024-06-01 NOTE — Telephone Encounter (Signed)
 Spoke with pt and aware instructions and prep ready for pick up at [address]

## 2024-06-01 NOTE — Progress Notes (Signed)
 06/01/2024     Endocrinology follow-up note   Subjective:    Patient ID: Megan Gibbs, female    DOB: Feb 04, 1951,    Past Medical History:  Diagnosis Date   Allergic rhinitis    Anemia    Anxiety    Back pain    Depression    Hypokalemia    Hypothyroidism    Osteoporosis    Prediabetes    Seasonal allergies    Seasonal allergies    Vertigo    Wears glasses    Past Surgical History:  Procedure Laterality Date   BACK SURGERY  1992 & 2009   Dr. Rica Chalet    BREAST BIOPSY     BREAST EXCISIONAL BIOPSY     BREAST LUMPECTOMY WITH RADIOACTIVE SEED LOCALIZATION Right 12/01/2014   Procedure: RIGHT BREAST LUMPECTOMY WITH RADIOACTIVE SEED LOCALIZATION;  Surgeon: Boyce Byes, MD;  Location: [facility];  Service: General;  Laterality: Right;   CATARACT EXTRACTION W/PHACO Right 06/15/2022   Procedure: CATARACT EXTRACTION PHACO AND INTRAOCULAR LENS PLACEMENT (IOC);  Surgeon: Tarri Farm, MD;  Location: [facility];  Service: Ophthalmology;  Laterality: Right;  CDE: 10.49   CATARACT EXTRACTION W/PHACO Left 07/09/2022   Procedure: CATARACT EXTRACTION PHACO AND INTRAOCULAR LENS PLACEMENT (IOC);  Surgeon: Tarri Farm, MD;  Location: [facility];  Service: Ophthalmology;  Laterality: Left;  CDE: 9.15   COLONOSCOPY N/A 04/19/2014   Procedure: COLONOSCOPY;  Surgeon: Suzette Espy, MD;  Location: [facility];  Service: Endoscopy;  Laterality: N/A;  9:30 AM   DILATION AND CURETTAGE OF UTERUS     TUBAL LIGATION     1976   Social History   Socioeconomic History   Marital status: Divorced    Spouse name: Not on file   Number of children: 3   Years of education: Not on file   Highest education level: Not on file  Occupational History   Occupation: disabled     Employer: UNEMPLOYED  Tobacco Use   Smoking status: Never   Smokeless tobacco: Never  Vaping Use   Vaping status: Never Used  Substance and Sexual Activity   Alcohol use: No   Drug use: No   Sexual activity: Yes     Birth control/protection: Post-menopausal  Other Topics Concern   Not on file  Social History Narrative   DOING A LOT OF TRAVELING WITH HER CHURCH.    Social Drivers of Corporate investment banker Strain: Low Risk  (08/20/2023)   Overall Financial Resource Strain (CARDIA)    Difficulty of Paying Living Expenses: Not hard at all  Food Insecurity: No Food Insecurity (08/20/2023)   Hunger Vital Sign    Worried About Running Out of Food in the Last Year: Never true    Ran Out of Food in the Last Year: Never true  Transportation Needs: No Transportation Needs (08/20/2023)   PRAPARE - Administrator, Civil Service (Medical): No    Lack of Transportation (Non-Medical): No  Physical Activity: Inactive (08/20/2023)   Exercise Vital Sign    Days of Exercise per Week: 0 days    Minutes of Exercise per Session: 0 min  Stress: No Stress Concern Present (08/20/2023)   [employer] of Occupational Health - Occupational Stress Questionnaire    Feeling of Stress : Not at all  Social Connections: Moderately Integrated (08/20/2023)   Social Connection and Isolation Panel [NHANES]    Frequency of Communication with Friends and Family: More than three times a  week    Frequency of Social Gatherings with Friends and Family: More than three times a week    Attends Religious Services: More than 4 times per year    Active Member of Clubs or Organizations: Yes    Attends Banker Meetings: More than 4 times per year    Marital Status: Divorced   Outpatient Encounter Medications as of 06/01/2024  Medication Sig   alendronate  (FOSAMAX ) 70 MG tablet Take with a full glass of water  on an empty stomach.   azelastine  (ASTELIN ) 0.1 % nasal spray Place 2 sprays into both nostrils 2 (two) times daily. Use in each nostril as directed   busPIRone  (BUSPAR ) 5 MG tablet Take 1 tablet (5 mg total) by mouth 2 (two) times daily.   Calcium  Carb-Cholecalciferol (OYSTER SHELL CALCIUM  W/D) 500-5 MG-MCG  TABS TAKE (1) TABLET TWICE DAILY.   levothyroxine  (SYNTHROID ) 50 MCG tablet take 1 tablet (50 microgram total) by mouth daily before breakfast.   mirtazapine  (REMERON ) 7.5 MG tablet Take 1 tablet (7.5 mg total) by mouth at bedtime.   montelukast  (SINGULAIR ) 10 MG tablet Take one tablet by mouth once daily , as needed, for allergies   Multiple Vitamins-Minerals (MULTIVITAMIN ADULTS 50+ PO) Take 1 tablet by mouth daily.   omega-3 fish oil (MAXEPA) 1000 MG CAPS capsule Take 2 capsules by mouth daily.    No facility-administered encounter medications on file as of 06/01/2024.   ALLERGIES: No Known Allergies VACCINATION STATUS: Immunization History  Administered Date(s) Administered   Fluad Quad(high Dose 65+) 09/14/2019, 10/24/2020, 11/07/2021, 09/11/2022   Fluad Trivalent(High Dose 65+) 10/03/2023   Influenza Split 09/24/2012   Influenza Whole 10/04/2009, 09/29/2010, 09/17/2011   Influenza,inj,Quad PF,6+ Mos 10/29/2013, 10/11/2014, 09/20/2015, 08/20/2016, 09/17/2017, 09/18/2018   Moderna Covid-19 Vaccine Bivalent Booster 41yrs & up 11/13/2022, 12/18/2023   Moderna SARS-COV2 Booster Vaccination 07/04/2021, 11/21/2021   Moderna Sars-Covid-2 Vaccination 03/09/2020, 04/10/2020, 11/10/2020   Pneumococcal Conjugate-13 03/17/2015   Pneumococcal Polysaccharide-23 12/18/2016   Td 03/24/2010   Zoster Recombinant(Shingrix) 04/10/2023, 06/11/2023   Zoster, Live 09/20/2011    HPI  73 yr old female with medical hx as above.  She is following in this clinic for management of osteoporosis, hypothyroidism.  She is on Fosamax  70 mg p.o. weekly since 2015.  She has no complaints regarding her treatment for osteoporosis.   She has had lumbar disc prolapse for which she underwent surgery in 2002 and 2009. Her last bone density was in May 26, 2024 with no significant change from her prior studies.    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  She was given RAI thyroid  ablation which  helped her symptoms resolve and patient presents with significant clinical improvement.  She is currently on levothyroxine  50 mcg p.o. daily before breakfast.  Her previsit thyroid  function tests are consistent with appropriate replacement.    She returns with continued clinical improvement   See notes from previous visits.  She has mildly fluctuating body weight.  She has not made a change in her diet.  Her previsit labs still show significant dyslipidemia.       She denies dysphagia, shortness of breath, nor voice change. She  has 3 grown children. she denies parathyroid, thyroid  dysfunction.  She denies history of fragility fracture.  She remains on vitamin D  and low-dose calcium  supplement. Review of her medical records also indicates hyperlipidemia not on treatment.  Her recent labs shows that her dyslipidemia is worsening, she has not continued on her statin  discussed and prescribed during her last visit.     Review of Systems Limited as above.  Objective:    BP 120/74   Pulse 72   Ht 5\' 2"  (1.575 m)   Wt 115 lb 12.8 oz (52.5 kg)   BMI 21.18 kg/m   Wt Readings from Last 3 Encounters:  06/01/24 115 lb 12.8 oz (52.5 kg)  05/05/24 116 lb 1.3 oz (52.7 kg)  12/05/23 116 lb 1.9 oz (52.7 kg)    Physical Exam     Results for orders placed or performed in visit on 12/05/23  Lipid panel   Collection Time: 04/28/24  9:37 AM  Result Value Ref Range   Cholesterol, Total 200 (H) 100 - 199 mg/dL   Triglycerides 161 0 - 149 mg/dL   HDL 62 >09 mg/dL   VLDL Cholesterol Cal 20 5 - 40 mg/dL   LDL Chol Calc (NIH) 604 (H) 0 - 99 mg/dL   Chol/HDL Ratio 3.2 0.0 - 4.4 ratio  CMP14+EGFR   Collection Time: 04/28/24  9:37 AM  Result Value Ref Range   Glucose 80 70 - 99 mg/dL   BUN 12 8 - 27 mg/dL   Creatinine, Ser 5.40 0.57 - 1.00 mg/dL   eGFR 82 >98 JX/BJY/7.82   BUN/Creatinine Ratio 16 12 - 28   Sodium 140 134 - 144 mmol/L   Potassium 4.5 3.5 - 5.2 mmol/L   Chloride 105 96 - 106  mmol/L   CO2 20 20 - 29 mmol/L   Calcium  9.5 8.7 - 10.3 mg/dL   Total Protein 6.9 6.0 - 8.5 g/dL   Albumin 4.5 3.8 - 4.8 g/dL   Globulin, Total 2.4 1.5 - 4.5 g/dL   Bilirubin Total 0.3 0.0 - 1.2 mg/dL   Alkaline Phosphatase 114 44 - 121 IU/L   AST 19 0 - 40 IU/L   ALT 17 0 - 32 IU/L  CBC   Collection Time: 04/28/24  9:37 AM  Result Value Ref Range   WBC 4.5 3.4 - 10.8 x10E3/uL   RBC 4.59 3.77 - 5.28 x10E6/uL   Hemoglobin 12.5 11.1 - 15.9 g/dL   Hematocrit 95.6 21.3 - 46.6 %   MCV 84 79 - 97 fL   MCH 27.2 26.6 - 33.0 pg   MCHC 32.3 31.5 - 35.7 g/dL   RDW 08.6 57.8 - 46.9 %   Platelets 229 150 - 450 x10E3/uL  VITAMIN D  25 Hydroxy (Vit-D Deficiency, Fractures)   Collection Time: 04/28/24  9:37 AM  Result Value Ref Range   Vit D, 25-Hydroxy 65.6 30.0 - 100.0 ng/mL   Complete Blood Count (Most recent): Lab Results  Component Value Date   WBC 4.5 04/28/2024   HGB 12.5 04/28/2024   HCT 38.7 04/28/2024   MCV 84 04/28/2024   PLT 229 04/28/2024   Chemistry (most recent): Lab Results  Component Value Date   NA 141 05/26/2024   K 4.4 05/26/2024   CL 105 05/26/2024   CO2 20 05/26/2024   BUN 13 05/26/2024   CREATININE 0.79 05/26/2024   Diabetic Labs (most recent): Lab Results  Component Value Date   HGBA1C 5.5 12/11/2016   HGBA1C 5.6 12/12/2015   HGBA1C 5.6 06/21/2015   Lipid Panel     Component Value Date/Time   CHOL 218 (H) 05/26/2024 0905   TRIG 116 05/26/2024 0905   HDL 64 05/26/2024 0905   CHOLHDL 3.4 05/26/2024 0905   CHOLHDL 3.1 03/07/2020 0952   VLDL 17 04/17/2017 6295  LDLCALC 134 (H) 05/26/2024 0905   LDLCALC 105 (H) 03/07/2020 0952   LABVLDL 20 05/26/2024 0905     Assessment & Plan:  1.  RAI induced hypothyroidism She is status post RAI thyroid  ablation for hyper functioning nodule in the left lobe of her thyroid . -Her thyroid  function tests are consistent with appropriate replacement.  She is advised to continue levothyroxine  50 mcg p.o. daily  before breakfast.     - We discussed about the correct intake of her thyroid  hormone, on empty stomach at fasting, with water , separated by at least 30 minutes from breakfast and other medications,  and separated by more than 4 hours from calcium , iron, multivitamins, acid reflux medications (PPIs). -Patient is made aware of the fact that thyroid  hormone replacement is needed for life, dose to be adjusted by periodic monitoring of thyroid  function tests.   2. 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  Previsit DEXA scan from May 17, 2020 showed continued progressive improvement compared to her last DEXA scan readings from 2011-2025 in spine, hips, and femuri. She has tolerated Fosamax  70 mg p.o. weekly since 2015, however due to suboptimal treatment outcomes, she would be considered for her next option of treatment.  I discussed and initiated Prolia treatment for her.  She will be contacted once prescription is procured.    -She has no recent fragility fracture.    Her PTH is normal along with calcium  and TFTs, ruling out secondary cause of osteoporosis.  Risk of untreated osteoporosis is discussed with her in detail.   3) dyslipidemia: Not on medications.  She has not taken her statin despite long discussion and prescription during her last visit and she still wants to avoid this intervention, does not really have a good reason. - she acknowledges that there is a room for improvement in her food and drink choices. - Suggestion is made for her to avoid simple carbohydrates  from her diet including Cakes, Sweet Desserts, Ice Cream, Soda (diet and regular), Sweet Tea, Candies, Chips, Cookies, Store Bought Juices, Alcohol , Artificial Sweeteners,  Coffee Creamer, and "Sugar-free" Products, Lemonade. This will help patient to have more stable blood glucose profile and potentially avoid unintended weight gain.  The following Lifestyle Medicine recommendations  according to American College of Lifestyle Medicine  [facility]) were discussed and and offered to patient and she  agrees to start the journey:  A. Whole Foods, Plant-Based Nutrition comprising of fruits and vegetables, plant-based proteins, whole-grain carbohydrates was discussed in detail with the patient.   A list for source of those nutrients were also provided to the patient.  Patient will use only water  or unsweetened tea for hydration. B.  The need to stay away from risky substances including alcohol, smoking; obtaining 7 to 9 hours of restorative sleep, at least 150 minutes of moderate intensity exercise weekly, the importance of healthy social connections,  and stress management techniques were discussed. C.  A full color page of  Calorie density of various food groups per pound showing examples of each food groups was provided to the patient.    4.  Vitamin D  deficiency -She is now vitamin D  replete at 65.6   She is advised to maintain with vitamin D3 600/200 mg p.o. daily.  I advised patient to maintain close follow up with their PCP for primary care needs.   I spent  25  minutes in the care of the patient today including review of labs from Thyroid   Function, CMP, and other relevant labs ; imaging/biopsy records (current and previous including abstractions from other facilities); face-to-face time discussing  her lab results and symptoms, medications doses, her options of short and long term treatment based on the latest standards of care / guidelines;   and documenting the encounter.  Megan Gibbs  participated in the discussions, expressed understanding, and voiced agreement with the above plans.  All questions were answered to her satisfaction. she is encouraged to contact clinic should she have any questions or concerns prior to her return visit.   Follow up plan: Return in about 6 months (around 12/01/2024) for Fasting Labs  in AM B4 8, Prolia Today (ASAP) and Prolia NV.  Kalvin Orf,  MD Phone: [phone]  Fax: [phone]  -  This note was partially dictated with voice recognition software. Similar sounding words can be transcribed inadequately or may not  be corrected upon review.  06/01/2024, 4:46 PM

## 2024-06-02 ENCOUNTER — Other Ambulatory Visit: Payer: Self-pay

## 2024-06-02 ENCOUNTER — Other Ambulatory Visit: Payer: Self-pay | Admitting: Pharmacy Technician

## 2024-06-02 DIAGNOSIS — M810 Age-related osteoporosis without current pathological fracture: Secondary | ICD-10-CM

## 2024-06-02 MED ORDER — PROLIA 60 MG/ML ~~LOC~~ SOSY
60.0000 mg | PREFILLED_SYRINGE | SUBCUTANEOUS | 1 refills | Status: DC
  Filled 2024-06-02: qty 1, 180d supply, fill #0

## 2024-06-02 NOTE — Progress Notes (Signed)
 Pharmacy Patient Advocate Encounter  Insurance verification completed.   The patient is insured through [employer]   Ran test claim for Prolia. Co-pay is $810.26. Patient is eligible for Medicare payment plan.  This test claim was processed through [facility]- copay amounts may vary at other pharmacies due to pharmacy/plan contracts, or as the patient moves through the different stages of their insurance plan.

## 2024-06-03 ENCOUNTER — Other Ambulatory Visit: Payer: Self-pay

## 2024-06-04 ENCOUNTER — Other Ambulatory Visit: Payer: Self-pay

## 2024-06-04 NOTE — Progress Notes (Signed)
 Due to high copay patient is going to talk to provider about options. [facility] does not do buy/bill. Dis-enrolling for now.

## 2024-06-18 ENCOUNTER — Other Ambulatory Visit: Payer: Self-pay | Admitting: "Endocrinology

## 2024-06-18 ENCOUNTER — Other Ambulatory Visit: Payer: Self-pay | Admitting: Family Medicine

## 2024-06-25 ENCOUNTER — Other Ambulatory Visit: Payer: Self-pay

## 2024-06-25 ENCOUNTER — Encounter (HOSPITAL_COMMUNITY): Payer: Self-pay | Admitting: Internal Medicine

## 2024-06-25 ENCOUNTER — Ambulatory Visit (HOSPITAL_COMMUNITY): Payer: Self-pay | Admitting: Anesthesiology

## 2024-06-25 ENCOUNTER — Encounter (HOSPITAL_COMMUNITY)
Admission: RE | Disposition: A | Discharge status: Home / Self Care | Payer: Self-pay | Source: Home / Self Care | Attending: Internal Medicine

## 2024-06-25 ENCOUNTER — Ambulatory Visit (HOSPITAL_COMMUNITY)
Admission: RE | Admit: 2024-06-25 | Discharge: 2024-06-25 | Disposition: A | Discharge status: Home / Self Care | Attending: Internal Medicine | Admitting: Internal Medicine

## 2024-06-25 DIAGNOSIS — D120 Benign neoplasm of cecum: Secondary | ICD-10-CM | POA: Insufficient documentation

## 2024-06-25 DIAGNOSIS — D123 Benign neoplasm of transverse colon: Secondary | ICD-10-CM | POA: Insufficient documentation

## 2024-06-25 DIAGNOSIS — F419 Anxiety disorder, unspecified: Secondary | ICD-10-CM | POA: Diagnosis not present

## 2024-06-25 DIAGNOSIS — D128 Benign neoplasm of rectum: Secondary | ICD-10-CM | POA: Diagnosis not present

## 2024-06-25 DIAGNOSIS — Z803 Family history of malignant neoplasm of breast: Secondary | ICD-10-CM | POA: Diagnosis not present

## 2024-06-25 DIAGNOSIS — K621 Rectal polyp: Secondary | ICD-10-CM

## 2024-06-25 DIAGNOSIS — K635 Polyp of colon: Secondary | ICD-10-CM

## 2024-06-25 DIAGNOSIS — Z1211 Encounter for screening for malignant neoplasm of colon: Secondary | ICD-10-CM | POA: Insufficient documentation

## 2024-06-25 DIAGNOSIS — E039 Hypothyroidism, unspecified: Secondary | ICD-10-CM | POA: Diagnosis not present

## 2024-06-25 DIAGNOSIS — K5730 Diverticulosis of large intestine without perforation or abscess without bleeding: Secondary | ICD-10-CM | POA: Diagnosis not present

## 2024-06-25 DIAGNOSIS — I10 Essential (primary) hypertension: Secondary | ICD-10-CM | POA: Diagnosis not present

## 2024-06-25 DIAGNOSIS — Z860100 Personal history of colon polyps, unspecified: Secondary | ICD-10-CM

## 2024-06-25 HISTORY — PX: COLONOSCOPY: SHX5424

## 2024-06-25 SURGERY — COLONOSCOPY
Anesthesia: General

## 2024-06-25 MED ORDER — LACTATED RINGERS IV SOLN: INTRAVENOUS | Status: DC

## 2024-06-25 MED ORDER — PROPOFOL 500 MG/50ML IV EMUL
INTRAVENOUS | Status: DC | PRN
Start: 2024-06-25 — End: 2024-06-25
  Administered 2024-06-25: 200 ug/kg/min via INTRAVENOUS

## 2024-06-25 MED ORDER — PHENYLEPHRINE 80 MCG/ML (10ML) SYRINGE FOR IV PUSH (FOR BLOOD PRESSURE SUPPORT)
PREFILLED_SYRINGE | INTRAVENOUS | Status: DC | PRN
Start: 2024-06-25 — End: 2024-06-25
  Administered 2024-06-25: 160 ug via INTRAVENOUS

## 2024-06-25 NOTE — H&P (Addendum)
 @LOGO @   Primary Care Physician:  Antonetta Rollene BRAVO, MD Primary Gastroenterologist:  Dr. Shaaron  Pre-Procedure History & Physical: HPI:  Megan Gibbs is a 73 y.o. female is here for a screening colonoscopy.  Average risk examination.  Negative colonoscopy 2015.  Patient tells me that both of her sisters are in a surveillance program for polyps.  Her daughter is also in her surveillance program due to polyps.  Daughter also has breast cancer.  Past Medical History:  Diagnosis Date   Allergic rhinitis    Anemia    Anxiety    Back pain    Depression    Hypokalemia    Hypothyroidism    Osteoporosis    Prediabetes    Seasonal allergies    Seasonal allergies    Vertigo    Wears glasses     Past Surgical History:  Procedure Laterality Date   BACK SURGERY  1992 & 2009   Dr. Leeann    BREAST BIOPSY     BREAST EXCISIONAL BIOPSY     BREAST LUMPECTOMY WITH RADIOACTIVE SEED LOCALIZATION Right 12/01/2014   Procedure: RIGHT BREAST LUMPECTOMY WITH RADIOACTIVE SEED LOCALIZATION;  Surgeon: Elon Pacini, MD;  Location: [facility];  Service: General;  Laterality: Right;   CATARACT EXTRACTION W/PHACO Right 06/15/2022   Procedure: CATARACT EXTRACTION PHACO AND INTRAOCULAR LENS PLACEMENT (IOC);  Surgeon: Harrie Agent, MD;  Location: [facility];  Service: Ophthalmology;  Laterality: Right;  CDE: 10.49   CATARACT EXTRACTION W/PHACO Left 07/09/2022   Procedure: CATARACT EXTRACTION PHACO AND INTRAOCULAR LENS PLACEMENT (IOC);  Surgeon: Harrie Agent, MD;  Location: [facility];  Service: Ophthalmology;  Laterality: Left;  CDE: 9.15   COLONOSCOPY N/A 04/19/2014   Procedure: COLONOSCOPY;  Surgeon: Lamar CHRISTELLA Shaaron, MD;  [facility];  Service: Endoscopy;  Laterality: N/A;  9:30 AM   DILATION AND CURETTAGE OF UTERUS     TUBAL LIGATION     1976    Prior to Admission medications   Medication Sig Start Date End Date Taking? Authorizing Provider  alendronate  (FOSAMAX ) 70 MG tablet Take  with a full glass of water  on an empty stomach. 11/29/22  Yes Nida, Gebreselassie W, MD  azelastine  (ASTELIN ) 0.1 % nasal spray Place 2 sprays into both nostrils 2 (two) times daily. Use in each nostril as directed 12/05/23  Yes Antonetta Rollene BRAVO, MD  busPIRone  (BUSPAR ) 5 MG tablet Take 1 tablet (5 mg total) by mouth 2 (two) times daily. 05/05/24  Yes Antonetta Rollene BRAVO, MD  Calcium  Carb-Cholecalciferol (OYSTER SHELL CALCIUM  W/D) 500-5 MG-MCG TABS TAKE (1) TABLET TWICE DAILY. 03/11/23  Yes Antonetta Rollene BRAVO, MD  levothyroxine  (SYNTHROID ) 50 MCG tablet take 1 tablet (50 microgram total) by mouth daily before breakfast. 06/19/24  Yes Nida, Gebreselassie W, MD  mirtazapine  (REMERON ) 7.5 MG tablet Take 1 tablet (7.5 mg total) by mouth at bedtime. 05/05/24  Yes Antonetta Rollene BRAVO, MD  montelukast  (SINGULAIR ) 10 MG tablet take one tablet by mouth once daily , as needed, for allergies 06/18/24  Yes Antonetta Rollene BRAVO, MD  Multiple Vitamins-Minerals (MULTIVITAMIN ADULTS 50+ PO) Take 1 tablet by mouth daily.   Yes [provider]  omega-3 fish oil (MAXEPA) 1000 MG CAPS capsule Take 2 capsules by mouth daily.    Yes [provider]  denosumab  (PROLIA ) 60 MG/ML SOSY injection Inject 60 mg into the skin every 6 (six) months. 06/02/24   Nida, Gebreselassie W, MD    Allergies as of 05/27/2024   (  No Known Allergies)    Family History  Problem Relation Age of Onset   Diabetes Mother    Hypertension Mother    Asthma Sister    Thyroid  disease Sister    COPD Sister    Anemia Sister    Diabetes Sister    Hypertension Sister    Diabetes Sister    Schizophrenia Father    Drug abuse Brother    Alcohol abuse Brother    Sleep apnea Grandchild    ADD / ADHD Grandchild    Seizures Grandchild    Breast cancer Sister 37   Thyroid  disease Sister    Hypertension Brother    Anxiety disorder Maternal Aunt    Hypertension Daughter    BRCA 1/2 Daughter    Anemia Daughter    Hypertension Daughter     Asthma Daughter    Colon cancer Neg Hx     Social History   Socioeconomic History   Marital status: Divorced    Spouse name: Not on file   Number of children: 3   Years of education: Not on file   Highest education level: Not on file  Occupational History   Occupation: disabled     Employer: UNEMPLOYED  Tobacco Use   Smoking status: Never   Smokeless tobacco: Never  Vaping Use   Vaping status: Never Used  Substance and Sexual Activity   Alcohol use: No   Drug use: No   Sexual activity: Yes    Birth control/protection: Post-menopausal  Other Topics Concern   Not on file  Social History Narrative   DOING A LOT OF TRAVELING WITH HER CHURCH.    Social Drivers of Corporate investment banker Strain: Low Risk  (08/20/2023)   Overall Financial Resource Strain (CARDIA)    Difficulty of Paying Living Expenses: Not hard at all  Food Insecurity: No Food Insecurity (08/20/2023)   Hunger Vital Sign    Worried About Running Out of Food in the Last Year: Never true    Ran Out of Food in the Last Year: Never true  Transportation Needs: No Transportation Needs (08/20/2023)   PRAPARE - Administrator, Civil Service (Medical): No    Lack of Transportation (Non-Medical): No  Physical Activity: Inactive (08/20/2023)   Exercise Vital Sign    Days of Exercise per Week: 0 days    Minutes of Exercise per Session: 0 min  Stress: No Stress Concern Present (08/20/2023)   [employer] of Occupational Health - Occupational Stress Questionnaire    Feeling of Stress : Not at all  Social Connections: Moderately Integrated (08/20/2023)   Social Connection and Isolation Panel    Frequency of Communication with Friends and Family: More than three times a week    Frequency of Social Gatherings with Friends and Family: More than three times a week    Attends Religious Services: More than 4 times per year    Active Member of [employer] or Organizations: Yes    Attends Hospital doctor: More than 4 times per year    Marital Status: Divorced  Intimate Partner Violence: Not At Risk (08/20/2023)   Humiliation, Afraid, Rape, and Kick questionnaire    Fear of Current or Ex-Partner: No    Emotionally Abused: No    Physically Abused: No    Sexually Abused: No    Review of Systems: See HPI, otherwise negative ROS  Physical Exam: BP 137/73   Pulse 72   Temp 97.9 F (36.6  C) (Oral)   Resp 14   Ht 5' 1 (1.549 m)   Wt 52.2 kg   SpO2 100%   BMI 21.73 kg/m  General:   Alert,  Well-developed, well-nourished, pleasant and cooperative in NAD Lungs:  Clear throughout to auscultation.   No wheezes, crackles, or rhonchi. No acute distress. Heart:  Regular rate and rhythm; no murmurs, clicks, rubs,  or gallops. Abdomen:  Soft, nontender and nondistended. No masses, hepatosplenomegaly or hernias noted. Normal bowel sounds, without guarding, and without rebound.   Msk:  Symmetrical without gross deformities. Normal posture. Impression/Plan: JERZI TIGERT is now here to undergo a screening colonoscopy.  This would be a high risk screening examination.  Risks, benefits, limitations, imponderables and alternatives regarding colonoscopy have been reviewed with the patient. Questions have been answered. All parties agreeable.     Notice:  This dictation was prepared with Dragon dictation along with smaller phrase technology. Any transcriptional errors that result from this process are unintentional and may not be corrected upon review.

## 2024-06-25 NOTE — Transfer of Care (Signed)
 Immediate Anesthesia Transfer of Care Note  Patient: Megan Gibbs  Procedure(s) Performed: COLONOSCOPY  Patient Location: Endoscopy Unit  Anesthesia Type:General  Level of Consciousness: awake, alert , oriented, and patient cooperative  Airway & Oxygen Therapy: Patient Spontanous Breathing  Post-op Assessment: Report given to RN, Post -op Vital signs reviewed and stable, and Patient moving all extremities X 4  Post vital signs: Reviewed and stable  Last Vitals:  Vitals Value Taken Time  BP 99/50 06/25/24 13:13  Temp 36.4 C 06/25/24 13:13  Pulse 71 06/25/24 13:13  Resp 17 06/25/24 13:13  SpO2 96 % 06/25/24 13:13    Last Pain:  Vitals:   06/25/24 1318  TempSrc:   PainSc: 0-No pain      Patients Stated Pain Goal: 7 (06/25/24 1044)  Complications: No notable events documented.

## 2024-06-25 NOTE — Hospital Course (Signed)
 @LOGO @   Primary Care Physician:  Antonetta Rollene BRAVO, MD Primary Gastroenterologist:  Dr.   Pre-Procedure History & Physical: HPI:  Megan Gibbs is a 73 y.o. female here for   Past Medical History:  Diagnosis Date   Allergic rhinitis    Anemia    Anxiety    Back pain    Depression    Hypokalemia    Hypothyroidism    Osteoporosis    Prediabetes    Seasonal allergies    Seasonal allergies    Vertigo    Wears glasses     Past Surgical History:  Procedure Laterality Date   BACK SURGERY  1992 & 2009   Dr. Leeann    BREAST BIOPSY     BREAST EXCISIONAL BIOPSY     BREAST LUMPECTOMY WITH RADIOACTIVE SEED LOCALIZATION Right 12/01/2014   Procedure: RIGHT BREAST LUMPECTOMY WITH RADIOACTIVE SEED LOCALIZATION;  Surgeon: Elon Pacini, MD;  Location: [facility];  Service: General;  Laterality: Right;   CATARACT EXTRACTION W/PHACO Right 06/15/2022   Procedure: CATARACT EXTRACTION PHACO AND INTRAOCULAR LENS PLACEMENT (IOC);  Surgeon: Harrie Agent, MD;  Location: [facility];  Service: Ophthalmology;  Laterality: Right;  CDE: 10.49   CATARACT EXTRACTION W/PHACO Left 07/09/2022   Procedure: CATARACT EXTRACTION PHACO AND INTRAOCULAR LENS PLACEMENT (IOC);  Surgeon: Harrie Agent, MD;  Location: [facility];  Service: Ophthalmology;  Laterality: Left;  CDE: 9.15   COLONOSCOPY N/A 04/19/2014   Procedure: COLONOSCOPY;  Surgeon: Lamar CHRISTELLA Hollingshead, MD;  Location: [facility];  Service: Endoscopy;  Laterality: N/A;  9:30 AM   DILATION AND CURETTAGE OF UTERUS     TUBAL LIGATION     1976    Prior to Admission medications   Medication Sig Start Date End Date Taking? Authorizing Provider  alendronate  (FOSAMAX ) 70 MG tablet Take with a full glass of water  on an empty stomach. 11/29/22  Yes Nida, Gebreselassie W, MD  azelastine  (ASTELIN ) 0.1 % nasal spray Place 2 sprays into both nostrils 2 (two) times daily. Use in each nostril as directed 12/05/23  Yes Antonetta Rollene BRAVO, MD  busPIRone  (BUSPAR )  5 MG tablet Take 1 tablet (5 mg total) by mouth 2 (two) times daily. 05/05/24  Yes Antonetta Rollene BRAVO, MD  Calcium  Carb-Cholecalciferol (OYSTER SHELL CALCIUM  W/D) 500-5 MG-MCG TABS TAKE (1) TABLET TWICE DAILY. 03/11/23  Yes Antonetta Rollene BRAVO, MD  levothyroxine  (SYNTHROID ) 50 MCG tablet take 1 tablet (50 microgram total) by mouth daily before breakfast. 06/19/24  Yes Nida, Gebreselassie W, MD  mirtazapine  (REMERON ) 7.5 MG tablet Take 1 tablet (7.5 mg total) by mouth at bedtime. 05/05/24  Yes Antonetta Rollene BRAVO, MD  montelukast  (SINGULAIR ) 10 MG tablet take one tablet by mouth once daily , as needed, for allergies 06/18/24  Yes Antonetta Rollene BRAVO, MD  Multiple Vitamins-Minerals (MULTIVITAMIN ADULTS 50+ PO) Take 1 tablet by mouth daily.   Yes [provider]  omega-3 fish oil (MAXEPA) 1000 MG CAPS capsule Take 2 capsules by mouth daily.    Yes [provider]  denosumab  (PROLIA ) 60 MG/ML SOSY injection Inject 60 mg into the skin every 6 (six) months. 06/02/24   Nida, Gebreselassie W, MD    Allergies as of 05/27/2024   (No Known Allergies)    Family History  Problem Relation Age of Onset   Diabetes Mother    Hypertension Mother    Asthma Sister    Thyroid  disease Sister    COPD Sister    Anemia Sister  Diabetes Sister    Hypertension Sister    Diabetes Sister    Schizophrenia Father    Drug abuse Brother    Alcohol abuse Brother    Sleep apnea Grandchild    ADD / ADHD Grandchild    Seizures Grandchild    Breast cancer Sister 78   Thyroid  disease Sister    Hypertension Brother    Anxiety disorder Maternal Aunt    Hypertension Daughter    BRCA 1/2 Daughter    Anemia Daughter    Hypertension Daughter    Asthma Daughter    Colon cancer Neg Hx     Social History   Socioeconomic History   Marital status: Divorced    Spouse name: Not on file   Number of children: 3   Years of education: Not on file   Highest education level: Not on file  Occupational History    Occupation: disabled     Employer: UNEMPLOYED  Tobacco Use   Smoking status: Never   Smokeless tobacco: Never  Vaping Use   Vaping status: Never Used  Substance and Sexual Activity   Alcohol use: No   Drug use: No   Sexual activity: Yes    Birth control/protection: Post-menopausal  Other Topics Concern   Not on file  Social History Narrative   DOING A LOT OF TRAVELING WITH HER CHURCH.    Social Drivers of Corporate investment banker Strain: Low Risk  (08/20/2023)   Overall Financial Resource Strain (CARDIA)    Difficulty of Paying Living Expenses: Not hard at all  Food Insecurity: No Food Insecurity (08/20/2023)   Hunger Vital Sign    Worried About Running Out of Food in the Last Year: Never true    Ran Out of Food in the Last Year: Never true  Transportation Needs: No Transportation Needs (08/20/2023)   PRAPARE - Administrator, Civil Service (Medical): No    Lack of Transportation (Non-Medical): No  Physical Activity: Inactive (08/20/2023)   Exercise Vital Sign    Days of Exercise per Week: 0 days    Minutes of Exercise per Session: 0 min  Stress: No Stress Concern Present (08/20/2023)   [employer] of Occupational Health - Occupational Stress Questionnaire    Feeling of Stress : Not at all  Social Connections: Moderately Integrated (08/20/2023)   Social Connection and Isolation Panel    Frequency of Communication with Friends and Family: More than three times a week    Frequency of Social Gatherings with Friends and Family: More than three times a week    Attends Religious Services: More than 4 times per year    Active Member of [employer] or Organizations: Yes    Attends Engineer, structural: More than 4 times per year    Marital Status: Divorced  Intimate Partner Violence: Not At Risk (08/20/2023)   Humiliation, Afraid, Rape, and Kick questionnaire    Fear of Current or Ex-Partner: No    Emotionally Abused: No    Physically Abused: No    Sexually  Abused: No    Review of Systems: See HPI, otherwise negative ROS  Physical Exam: BP 137/73   Pulse 72   Temp 97.9 F (36.6 C) (Oral)   Resp 14   Ht 5' 1 (1.549 m)   Wt 52.2 kg   SpO2 100%   BMI 21.73 kg/m  General:   Alert,  Well-developed, well-nourished, pleasant and cooperative in NAD Skin:  Intact without significant lesions or  rashes. Eyes:  Sclera clear, no icterus.   Conjunctiva pink. Ears:  Normal auditory acuity. Nose:  No deformity, discharge,  or lesions. Mouth:  No deformity or lesions. Neck:  Supple; no masses or thyromegaly. No significant cervical adenopathy. Lungs:  Clear throughout to auscultation.   No wheezes, crackles, or rhonchi. No acute distress. Heart:  Regular rate and rhythm; no murmurs, clicks, rubs,  or gallops. Abdomen: Non-distended, normal bowel sounds.  Soft and nontender without appreciable mass or hepatosplenomegaly.  Pulses:  Normal pulses noted. Extremities:  Without clubbing or edema.  Impression/Plan:  ***     Notice: This dictation was prepared with Dragon dictation along with smaller phrase technology. Any transcriptional errors that result from this process are unintentional and may not be corrected upon review.

## 2024-06-25 NOTE — Discharge Instructions (Addendum)
  Colonoscopy Discharge Instructions  Read the instructions outlined below and refer to this sheet in the next few weeks. These discharge instructions provide you with general information on caring for yourself after you leave the hospital. Your doctor may also give you specific instructions. While your treatment has been planned according to the most current medical practices available, unavoidable complications occasionally occur. If you have any problems or questions after discharge, call Dr. Shaaron at [phone]. ACTIVITY You may resume your regular activity, but move at a slower pace for the next 24 hours.  Take frequent rest periods for the next 24 hours.  Walking will help get rid of the air and reduce the bloated feeling in your belly (abdomen).  No driving for 24 hours (because of the medicine (anesthesia) used during the test).   Do not sign any important legal documents or operate any machinery for 24 hours (because of the anesthesia used during the test).  NUTRITION Drink plenty of fluids.  You may resume your normal diet as instructed by your doctor.  Begin with a light meal and progress to your normal diet. Heavy or fried foods are harder to digest and may make you feel sick to your stomach (nauseated).  Avoid alcoholic beverages for 24 hours or as instructed.  MEDICATIONS You may resume your normal medications unless your doctor tells you otherwise.  WHAT YOU CAN EXPECT TODAY Some feelings of bloating in the abdomen.  Passage of more gas than usual.  Spotting of blood in your stool or on the toilet paper.  IF YOU HAD POLYPS REMOVED DURING THE COLONOSCOPY: No aspirin products for 7 days or as instructed.  No alcohol for 7 days or as instructed.  Eat a soft diet for the next 24 hours.  FINDING OUT THE RESULTS OF YOUR TEST Not all test results are available during your visit. If your test results are not back during the visit, make an appointment with your caregiver to find out the  results. Do not assume everything is normal if you have not heard from your caregiver or the medical facility. It is important for you to follow up on all of your test results.  SEEK IMMEDIATE MEDICAL ATTENTION IF: You have more than a spotting of blood in your stool.  Your belly is swollen (abdominal distention).  You are nauseated or vomiting.  You have a temperature over 101.  You have abdominal pain or discomfort that is severe or gets worse throughout the day.     4 polyps found and removed today  Further recommendations to follow pending review of pathology report

## 2024-06-25 NOTE — Anesthesia Preprocedure Evaluation (Signed)
 Anesthesia Evaluation  Patient identified by MRN, date of birth, ID band Patient awake    Reviewed: Allergy & Precautions, H&P , NPO status , Patient's Chart, lab work & pertinent test results, reviewed documented beta blocker date and time   Airway Mallampati: II  TM Distance: >3 FB Neck ROM: full    Dental no notable dental hx.    Pulmonary neg pulmonary ROS   Pulmonary exam normal breath sounds clear to auscultation       Cardiovascular Exercise Tolerance: Good hypertension, negative cardio ROS  Rhythm:regular Rate:Normal     Neuro/Psych  PSYCHIATRIC DISORDERS Anxiety Depression    negative neurological ROS  negative psych ROS   GI/Hepatic negative GI ROS, Neg liver ROS,,,  Endo/Other  negative endocrine ROSHypothyroidism Hyperthyroidism   Renal/GU negative Renal ROS  negative genitourinary   Musculoskeletal   Abdominal   Peds  Hematology negative hematology ROS (+) Blood dyscrasia, anemia   Anesthesia Other Findings   Reproductive/Obstetrics negative OB ROS                             Anesthesia Physical Anesthesia Plan  ASA: 2  Anesthesia Plan: General   Post-op Pain Management:    Induction:   PONV Risk Score and Plan: Propofol  infusion  Airway Management Planned:   Additional Equipment:   Intra-op Plan:   Post-operative Plan:   Informed Consent: I have reviewed the patients History and Physical, chart, labs and discussed the procedure including the risks, benefits and alternatives for the proposed anesthesia with the patient or authorized representative who has indicated his/her understanding and acceptance.     Dental Advisory Given  Plan Discussed with: CRNA  Anesthesia Plan Comments:        Anesthesia Quick Evaluation

## 2024-06-25 NOTE — Op Note (Signed)
 [facility] Patient Name: Megan Gibbs Procedure Date: 06/25/2024 11:33 AM MRN: [id number] Date of Birth: 1951-03-18 Attending MD: Lamar Ozell Hollingshead , MD, [id number] CSN: [id number] Age: 73 Admit Type: Outpatient Procedure:                Colonoscopy Indications:              Screening for colorectal malignant neoplasm Providers:                Lamar Ozell Hollingshead, MD, Rosina Sprague, Crystal                            Page, Jon Loge Referring MD:              Medicines:                Propofol  per Anesthesia Complications:            No immediate complications. Estimated Blood Loss:     Estimated blood loss was minimal. Procedure:                Pre-Anesthesia Assessment:                           - Prior to the procedure, a History and Physical                            was performed, and patient medications and                            allergies were reviewed. The patient's tolerance of                            previous anesthesia was also reviewed. The risks                            and benefits of the procedure and the sedation                            options and risks were discussed with the patient.                            All questions were answered, and informed consent                            was obtained. Prior Anticoagulants: The patient has                            taken no anticoagulant or antiplatelet agents. ASA                            Grade Assessment: II - A patient with mild systemic                            disease. After reviewing the risks and benefits,  the patient was deemed in satisfactory condition to                            undergo the procedure.                           After obtaining informed consent, the colonoscope                            was passed under direct vision. Throughout the                            procedure, the patient's blood pressure, pulse, and                             oxygen saturations were monitored continuously. The                            [id number]) scope was introduced through the                            anus and advanced to the the cecum, identified by                            appendiceal orifice and ileocecal valve. The                            colonoscopy was performed without difficulty. The                            patient tolerated the procedure well. The quality                            of the bowel preparation was good. The ileocecal                            valve, appendiceal orifice, and rectum were                            photographed. Scope In: 12:53:49 PM Scope Out: 1:09:26 PM Scope Withdrawal Time: 0 hours 6 minutes 33 seconds  Total Procedure Duration: 0 hours 15 minutes 37 seconds  Findings:      The perianal and digital rectal examinations were normal.      Four semi-pedunculated polyps were found in the rectum, proximal rectum,       hepatic flexure and cecum. The polyps were 4 to 6 mm in size. These       polyps were removed with a cold snare. Resection and retrieval were       complete. Estimated blood loss: none.      The exam was otherwise without abnormality on direct and retroflexion       views.      Scattered medium-mouthed diverticula were found in the sigmoid colon and       mid sigmoid colon. Impression:               -  Four 4 to 6 mm polyps in the rectum, in the                            proximal rectum, at the hepatic flexure and in the                            cecum, removed with a cold snare. Resected and                            retrieved.                           - The examination was otherwise normal on direct                            and retroflexion views.                           - Diverticulosis in the sigmoid colon and in the                            mid sigmoid colon. Moderate Sedation:      Moderate (conscious) sedation was personally administered by an        anesthesia professional. The following parameters were monitored: oxygen       saturation, heart rate, blood pressure, respiratory rate, EKG, adequacy       of pulmonary ventilation, and response to care. Recommendation:           - Patient has a contact number available for                            emergencies. The signs and symptoms of potential                            delayed complications were discussed with the                            patient. Return to normal activities tomorrow.                            Written discharge instructions were provided to the                            patient.                           - Advance diet as tolerated.                           - Continue present medications.                           - Repeat colonoscopy date to be determined after  pending pathology results are reviewed for                            surveillance.                           - Return to GI office (date not yet determined). Procedure Code(s):        --- Professional ---                           [phone], Colonoscopy, flexible; with removal of                            tumor(s), polyp(s), or other lesion(s) by snare                            technique Diagnosis Code(s):        --- Professional ---                           Z12.11, Encounter for screening for malignant                            neoplasm of colon                           D12.8, Benign neoplasm of rectum                           D12.3, Benign neoplasm of transverse colon (hepatic                            flexure or splenic flexure)                           D12.0, Benign neoplasm of cecum                           K57.30, Diverticulosis of large intestine without                            perforation or abscess without bleeding CPT copyright 2022 American Medical Association. All rights reserved. The codes documented in this report are preliminary and upon coder review  may  be revised to meet current compliance requirements. Lamar HERO. Kanya Potteiger, MD Lamar Ozell Hollingshead, MD 06/25/2024 1:17:08 PM This report has been signed electronically. Number of Addenda: 0

## 2024-06-26 ENCOUNTER — Encounter (HOSPITAL_COMMUNITY): Payer: Self-pay | Admitting: Internal Medicine

## 2024-06-26 ENCOUNTER — Ambulatory Visit: Payer: Self-pay | Admitting: Internal Medicine

## 2024-06-26 LAB — SURGICAL PATHOLOGY

## 2024-06-26 NOTE — Anesthesia Postprocedure Evaluation (Signed)
 Anesthesia Post Note  Patient: Megan Gibbs  Procedure(s) Performed: COLONOSCOPY  Patient location during evaluation: Phase II Anesthesia Type: General Level of consciousness: awake Pain management: pain level controlled Vital Signs Assessment: post-procedure vital signs reviewed and stable Respiratory status: spontaneous breathing and respiratory function stable Cardiovascular status: blood pressure returned to baseline and stable Postop Assessment: no headache and no apparent nausea or vomiting Anesthetic complications: no Comments: Late entry   No notable events documented.   Last Vitals:  Vitals:   06/25/24 1044 06/25/24 1313  BP: 137/73 (!) 99/50  Pulse: 72 71  Resp: 14 17  Temp: 36.6 C (!) 36.4 C  SpO2: 100% 96%    Last Pain:  Vitals:   06/25/24 1318  TempSrc:   PainSc: 0-No pain                 Yvonna JINNY Bosworth

## 2024-08-07 ENCOUNTER — Other Ambulatory Visit: Payer: Self-pay | Admitting: Family Medicine

## 2024-08-24 ENCOUNTER — Ambulatory Visit (INDEPENDENT_AMBULATORY_CARE_PROVIDER_SITE_OTHER): Payer: No Typology Code available for payment source

## 2024-08-24 VITALS — Ht 61.0 in | Wt 115.0 lb

## 2024-08-24 DIAGNOSIS — Z0000 Encounter for general adult medical examination without abnormal findings: Secondary | ICD-10-CM

## 2024-08-24 NOTE — Progress Notes (Signed)
 Subjective:   Megan Gibbs is a 73 y.o. who presents for a Medicare Wellness preventive visit.  As a reminder, Annual Wellness Visits don't include a physical exam, and some assessments may be limited, especially if this visit is performed virtually. We may recommend an in-person follow-up visit with your provider if needed.  Visit Complete: Virtual I connected with  Megan Gibbs on 08/24/24 by a audio enabled telemedicine application and verified that I am speaking with the correct person using two identifiers.  Patient Location: Home  Provider Location: Home Office  I discussed the limitations of evaluation and management by telemedicine. The patient expressed understanding and agreed to proceed.  Vital Signs: Because this visit was a virtual/telehealth visit, some criteria may be missing or patient reported. Any vitals not documented were not able to be obtained and vitals that have been documented are patient reported.  VideoDeclined- This patient declined Librarian, academic. Therefore the visit was completed with audio only.  Persons Participating in Visit: Patient.  AWV Questionnaire: No: Patient Medicare AWV questionnaire was not completed prior to this visit.  Cardiac Risk Factors include: advanced age (>30men, >44 women);dyslipidemia     Objective:    Today's Vitals   08/24/24 0952  Weight: 115 lb (52.2 kg)  Height: 5' 1 (1.549 m)   Body mass index is 21.73 kg/m.     08/24/2024    9:36 AM 06/25/2024   10:32 AM 08/20/2023    1:46 PM 07/30/2022   11:43 AM 07/09/2022   10:43 AM 06/08/2022    2:26 PM 07/20/2021    2:45 PM  Advanced Directives  Does Patient Have a Medical Advance Directive? No No No No No No No  Would patient like information on creating a medical advance directive? Yes (MAU/Ambulatory/Procedural Areas - Information given) No - Patient declined No - Patient declined No - Patient declined No - Patient declined No - Patient  declined No - Patient declined    Current Medications (verified) Outpatient Encounter Medications as of 08/24/2024  Medication Sig   alendronate  (FOSAMAX ) 70 MG tablet Take with a full glass of water  on an empty stomach.   azelastine  (ASTELIN ) 0.1 % nasal spray Place 2 sprays into both nostrils 2 (two) times daily. Use in each nostril as directed   busPIRone  (BUSPAR ) 5 MG tablet Take 1 tablet (5 mg total) by mouth 2 (two) times daily.   Calcium  Carb-Cholecalciferol (OYSTER SHELL CALCIUM  W/D) 500-5 MG-MCG TABS TAKE (1) TABLET TWICE DAILY.   denosumab  (PROLIA ) 60 MG/ML SOSY injection Inject 60 mg into the skin every 6 (six) months.   levothyroxine  (SYNTHROID ) 50 MCG tablet take 1 tablet (50 microgram total) by mouth daily before breakfast.   mirtazapine  (REMERON ) 7.5 MG tablet Take 1 tablet (7.5 mg total) by mouth at bedtime.   montelukast  (SINGULAIR ) 10 MG tablet take one tablet by mouth once daily , as needed, for allergies   Multiple Vitamins-Minerals (MULTIVITAMIN ADULTS 50+ PO) Take 1 tablet by mouth daily.   omega-3 fish oil (MAXEPA) 1000 MG CAPS capsule Take 2 capsules by mouth daily.    No facility-administered encounter medications on file as of 08/24/2024.    Allergies (verified) Patient has no known allergies.   History: Past Medical History:  Diagnosis Date   Allergic rhinitis    Anemia    Anxiety    Back pain    Depression    Hypokalemia    Hypothyroidism    Osteoporosis  Prediabetes    Seasonal allergies    Seasonal allergies    Vertigo    Wears glasses    Past Surgical History:  Procedure Laterality Date   BACK SURGERY  1992 & 2009   Dr. Leeann    BREAST BIOPSY     BREAST EXCISIONAL BIOPSY     BREAST LUMPECTOMY WITH RADIOACTIVE SEED LOCALIZATION Right 12/01/2014   Procedure: RIGHT BREAST LUMPECTOMY WITH RADIOACTIVE SEED LOCALIZATION;  Surgeon: Elon Pacini, MD;  Location: [facility];  Service: General;  Laterality: Right;   CATARACT  EXTRACTION W/PHACO Right 06/15/2022   Procedure: CATARACT EXTRACTION PHACO AND INTRAOCULAR LENS PLACEMENT (IOC);  Surgeon: Harrie Agent, MD;  Location: [facility];  Service: Ophthalmology;  Laterality: Right;  CDE: 10.49   CATARACT EXTRACTION W/PHACO Left 07/09/2022   Procedure: CATARACT EXTRACTION PHACO AND INTRAOCULAR LENS PLACEMENT (IOC);  Surgeon: Harrie Agent, MD;  Location: [facility];  Service: Ophthalmology;  Laterality: Left;  CDE: 9.15   COLONOSCOPY N/A 04/19/2014   Procedure: COLONOSCOPY;  Surgeon: Lamar CHRISTELLA Hollingshead, MD;  Location: [facility];  Service: Endoscopy;  Laterality: N/A;  9:30 AM   COLONOSCOPY N/A 06/25/2024   Procedure: COLONOSCOPY;  Surgeon: Hollingshead Lamar CHRISTELLA, MD;  Location: [facility];  Service: Endoscopy;  Laterality: N/A;  12:00pm, asa 2   DILATION AND CURETTAGE OF UTERUS     TUBAL LIGATION     1976   Family History  Problem Relation Age of Onset   Diabetes Mother    Hypertension Mother    Asthma Sister    Thyroid  disease Sister    COPD Sister    Anemia Sister    Diabetes Sister    Hypertension Sister    Diabetes Sister    Schizophrenia Father    Drug abuse Brother    Alcohol abuse Brother    Sleep apnea Grandchild    ADD / ADHD Grandchild    Seizures Grandchild    Breast cancer Sister 12   Thyroid  disease Sister    Hypertension Brother    Anxiety disorder Maternal Aunt    Hypertension Daughter    BRCA 1/2 Daughter    Anemia Daughter    Hypertension Daughter    Asthma Daughter    Colon cancer Neg Hx    Social History   Socioeconomic History   Marital status: Divorced    Spouse name: Not on file   Number of children: 3   Years of education: Not on file   Highest education level: Not on file  Occupational History   Occupation: disabled     Employer: UNEMPLOYED  Tobacco Use   Smoking status: Never   Smokeless tobacco: Never  Vaping Use   Vaping status: Never Used  Substance and Sexual Activity   Alcohol use: No   Drug use: No   Sexual  activity: Yes    Birth control/protection: Post-menopausal  Other Topics Concern   Not on file  Social History Narrative   DOING A LOT OF TRAVELING WITH HER CHURCH.    Social Drivers of Corporate investment banker Strain: Low Risk  (08/24/2024)   Overall Financial Resource Strain (CARDIA)    Difficulty of Paying Living Expenses: Not hard at all  Food Insecurity: No Food Insecurity (08/24/2024)   Hunger Vital Sign    Worried About Running Out of Food in the Last Year: Never true    Ran Out of Food in the Last Year: Never true  Transportation Needs: No Transportation Needs (  08/24/2024)   PRAPARE - Administrator, Civil Service (Medical): No    Lack of Transportation (Non-Medical): No  Physical Activity: Inactive (08/24/2024)   Exercise Vital Sign    Days of Exercise per Week: 0 days    Minutes of Exercise per Session: 0 min  Stress: No Stress Concern Present (08/24/2024)   [employer] of Occupational Health - Occupational Stress Questionnaire    Feeling of Stress: Not at all  Social Connections: Moderately Integrated (08/24/2024)   Social Connection and Isolation Panel    Frequency of Communication with Friends and Family: More than three times a week    Frequency of Social Gatherings with Friends and Family: More than three times a week    Attends Religious Services: More than 4 times per year    Active Member of [employer] or Organizations: Yes    Attends Engineer, structural: More than 4 times per year    Marital Status: Divorced    Tobacco Counseling Counseling given: Yes    Clinical Intake:  Pre-visit preparation completed: Yes  Pain : No/denies pain     BMI - recorded: 21.73 Nutritional Risks: None Diabetes: No  Lab Results  Component Value Date   HGBA1C 5.5 12/11/2016   HGBA1C 5.6 12/12/2015   HGBA1C 5.6 06/21/2015     How often do you need to have someone help you when you read instructions, pamphlets, or other written materials from  your doctor or pharmacy?: 1 - Never  Interpreter Needed?: No  Information entered by :: Mairin Lindsley W CMA (AAMA)   Activities of Daily Living     08/24/2024   10:00 AM  In your present state of health, do you have any difficulty performing the following activities:  Hearing? 0  Vision? 0  Difficulty concentrating or making decisions? 0  Walking or climbing stairs? 0  Dressing or bathing? 0  Doing errands, shopping? 0  Preparing Food and eating ? N  Using the Toilet? N  In the past six months, have you accidently leaked urine? N  Do you have problems with loss of bowel control? N  Managing your Medications? N  Managing your Finances? N  Housekeeping or managing your Housekeeping? N    Patient Care Team: Antonetta Rollene BRAVO, MD as PCP - General Rourk, Lamar HERO, MD as Consulting Physician (Gastroenterology) Lay, Harlene Bonine, MD (Psychiatry) Lenis Ethelle ORN, MD as Consulting Physician (Endocrinology) Darroll Anes, DO as Consulting Physician (Optometry)  I have updated your Care Teams any recent Medical Services you may have received from other providers in the past year.     Assessment:   This is a routine wellness examination for [facility].  Hearing/Vision screen Hearing Screening - Comments:: Patient denies any hearing difficulties.   Vision Screening - Comments:: Wears rx glasses - up to date with routine eye exams with  [facility] [place] location    Goals Addressed               This Visit's Progress     Maintain independence (pt-stated)        I want to find different housing and be able to get back to where I can drive. I can't drive right now due to anxiety.       Patient Stated   On track     Find a new place to live       Depression Screen     08/24/2024   10:02 AM 05/05/2024  9:41 AM 12/05/2023    9:46 AM 10/03/2023    9:53 AM 08/20/2023    1:53 PM 06/26/2023    1:42 PM 04/23/2023   11:08 AM  PHQ 2/9 Scores  PHQ - 2 Score 0 0 0 0 0 0 3  PHQ- 9  Score 0   0 2 3 13     Fall Risk     08/24/2024    9:54 AM 05/05/2024    9:40 AM 12/05/2023    9:46 AM 10/03/2023    9:52 AM 08/20/2023    1:59 PM  Fall Risk   Falls in the past year? 0 0 0 0 0  Number falls in past yr: 0 0 0 0 0  Injury with Fall? 0 0 0 0 0  Risk for fall due to : No Fall Risks  No Fall Risks No Fall Risks No Fall Risks  Follow up Falls evaluation completed;Education provided;Falls prevention discussed Falls evaluation completed Falls evaluation completed Falls evaluation completed Falls prevention discussed    MEDICARE RISK AT HOME:  Medicare Risk at Home Any stairs in or around the home?: Yes If so, are there any without handrails?: No Home free of loose throw rugs in walkways, pet beds, electrical cords, etc?: Yes Adequate lighting in your home to reduce risk of falls?: Yes Life alert?: No Use of a cane, walker or w/c?: No Grab bars in the bathroom?: Yes Shower chair or bench in shower?: Yes Elevated toilet seat or a handicapped toilet?: Yes  TIMED UP AND GO:  Was the test performed?  No  Cognitive Function: 6CIT completed        08/24/2024   10:01 AM 08/20/2023    1:50 PM 07/30/2022   11:44 AM 07/20/2021    2:51 PM 07/20/2021    2:46 PM  6CIT Screen  What Year? 0 points 0 points 0 points 0 points 0 points  What month? 0 points 0 points 0 points 0 points 0 points  What time? 0 points 0 points 0 points 0 points 0 points  Count back from 20 0 points 0 points 0 points 0 points 0 points  Months in reverse 0 points 0 points 0 points 0 points 0 points  Repeat phrase 0 points 0 points 2 points 0 points 0 points  Total Score 0 points 0 points 2 points 0 points 0 points    Immunizations Immunization History  Administered Date(s) Administered   Fluad Quad(high Dose 65+) 09/14/2019, 10/24/2020, 11/07/2021, 09/11/2022   Fluad Trivalent(High Dose 65+) 10/03/2023   Influenza Split 09/24/2012   Influenza Whole 10/04/2009, 09/29/2010, 09/17/2011    Influenza,inj,Quad PF,6+ Mos 10/29/2013, 10/11/2014, 09/20/2015, 08/20/2016, 09/17/2017, 09/18/2018   Moderna Covid-19 Vaccine Bivalent Booster 61yrs & up 11/13/2022, 12/18/2023   Moderna SARS-COV2 Booster Vaccination 07/04/2021, 11/21/2021   Moderna Sars-Covid-2 Vaccination 03/09/2020, 04/10/2020, 11/10/2020   Pneumococcal Conjugate-13 03/17/2015   Pneumococcal Polysaccharide-23 12/18/2016   Td 03/24/2010   Zoster Recombinant(Shingrix) 04/10/2023, 06/11/2023   Zoster, Live 09/20/2011    Screening Tests Health Maintenance  Topic Date Due   DTaP/Tdap/Td (2 - Tdap) 03/24/2020   COVID-19 Vaccine (6 - 2024-25 season) 02/12/2024   Medicare Annual Wellness (AWV)  08/19/2024   INFLUENZA VACCINE  07/31/2024   MAMMOGRAM  01/07/2025   Colonoscopy  06/25/2034   Pneumococcal Vaccine: 50+ Years  Completed   DEXA SCAN  Completed   Hepatitis C Screening  Completed   Zoster Vaccines- Shingrix  Completed   HPV VACCINES  Aged Out  Meningococcal B Vaccine  Aged Out    Health Maintenance  Health Maintenance Due  Topic Date Due   DTaP/Tdap/Td (2 - Tdap) 03/24/2020   COVID-19 Vaccine (6 - 2024-25 season) 02/12/2024   Medicare Annual Wellness (AWV)  08/19/2024   INFLUENZA VACCINE  07/31/2024   Health Maintenance Items Addressed: Routine health screenings are up to date. Patient is aware of recommended vaccines.   Additional Screening:  Vision Screening: Recommended annual ophthalmology exams for early detection of glaucoma and other disorders of the eye. Would you like a referral to an eye doctor? No    Dental Screening: Recommended annual dental exams for proper oral hygiene  Community Resource Referral / Chronic Care Management: CRR required this visit?  No   CCM required this visit?  PCP informed of CCM need Patient would like a referral to go back to see Winton Rubinstein, LCSW for therapy for anxiety. Message sent to provider  Plan:    I have personally reviewed and noted the  following in the patient's chart:   Medical and social history Use of alcohol, tobacco or illicit drugs  Current medications and supplements including opioid prescriptions. Patient is not currently taking opioid prescriptions. Functional ability and status Nutritional status Physical activity Advanced directives List of other physicians Hospitalizations, surgeries, and ER visits in previous 12 months Vitals Screenings to include cognitive, depression, and falls Referrals and appointments  In addition, I have reviewed and discussed with patient certain preventive protocols, quality metrics, and best practice recommendations. A written personalized care plan for preventive services as well as general preventive health recommendations were provided to patient.   Anab Vivar, CMA   08/24/2024   After Visit Summary: (Mail) Due to this being a telephonic visit, the after visit summary with patients personalized plan was offered to patient via mail   Notes: Please refer to Routing Comments.

## 2024-08-24 NOTE — Patient Instructions (Signed)
 Megan Gibbs , Thank you for taking time out of your busy schedule to complete your Annual Wellness Visit with me. I enjoyed our conversation and look forward to speaking with you again next year. I, as well as your care team,  appreciate your ongoing commitment to your health goals. Please review the following plan we discussed and let me know if I can assist you in the future.  Your Game plan/ To Do List   Follow up Visits: We will see or speak with you next year for your Next Medicare AWV with our clinical staff   Clinician Recommendations:  Aim for 30 minutes of exercise or brisk walking, 6-8 glasses of water , and 5 servings of fruits and vegetables each day.    Wishing you many blessings and good health during the next year until our next visit.  -Rorie Delmore   This is a list of the screenings recommended for you:  Health Maintenance  Topic Date Due   DTaP/Tdap/Td vaccine (2 - Tdap) 03/24/2020   COVID-19 Vaccine (6 - 2024-25 season) 02/12/2024   Flu Shot  07/31/2024   Mammogram  01/07/2025   Medicare Annual Wellness Visit  08/24/2025   DEXA scan (bone density measurement)  05/26/2026   Colon Cancer Screening  06/26/2027   Pneumococcal Vaccine for age over 4  Completed   Hepatitis C Screening  Completed   Zoster (Shingles) Vaccine  Completed   HPV Vaccine  Aged Out   Meningitis B Vaccine  Aged Out    Advanced directives: (Provided) Advance directive discussed with you today. I have provided a copy for you to complete at home and have notarized. Once this is complete, please bring a copy in to our office so we can scan it into your chart.  Advance Care Planning is important because it:  [x]  Makes sure you receive the medical care that is consistent with your values, goals, and preferences  [x]  It provides guidance to your family and loved ones and reduces their decisional burden about whether or not they are making the right decisions based on your wishes.  Follow the link provided in  your after visit summary or read over the paperwork we have mailed to you to help you started getting your Advance Directives in place. If you need assistance in completing these, please reach out to us  so that we can help you!  See attachments for Preventive Care and Fall Prevention Tips.

## 2024-09-14 ENCOUNTER — Other Ambulatory Visit: Payer: Self-pay | Admitting: "Endocrinology

## 2024-09-18 ENCOUNTER — Other Ambulatory Visit: Payer: Self-pay

## 2024-09-18 DIAGNOSIS — E890 Postprocedural hypothyroidism: Secondary | ICD-10-CM

## 2024-09-18 MED ORDER — LEVOTHYROXINE SODIUM 50 MCG PO TABS: 50.0000 ug | ORAL_TABLET | Freq: Every day | ORAL | 0 refills | Status: AC

## 2024-10-01 ENCOUNTER — Ambulatory Visit
Admission: EM | Admit: 2024-10-01 | Discharge: 2024-10-01 | Disposition: A | Discharge status: Home / Self Care | Attending: Nurse Practitioner | Admitting: Nurse Practitioner

## 2024-10-01 DIAGNOSIS — J329 Chronic sinusitis, unspecified: Secondary | ICD-10-CM | POA: Diagnosis not present

## 2024-10-01 DIAGNOSIS — R42 Dizziness and giddiness: Secondary | ICD-10-CM | POA: Diagnosis not present

## 2024-10-01 DIAGNOSIS — B9789 Other viral agents as the cause of diseases classified elsewhere: Secondary | ICD-10-CM | POA: Diagnosis not present

## 2024-10-01 LAB — POC SOFIA SARS ANTIGEN FIA: SARS Coronavirus 2 Ag: NEGATIVE

## 2024-10-01 MED ORDER — BENZONATATE 100 MG PO CAPS: 100.0000 mg | ORAL_CAPSULE | Freq: Three times a day (TID) | ORAL | 0 refills | Status: DC | PRN

## 2024-10-01 NOTE — ED Triage Notes (Addendum)
 Pt reports right side headache, dental pain, dizziness, sinus pressure started yesterday. Pt states she was also staggering.

## 2024-10-01 NOTE — Discharge Instructions (Addendum)
 You have a viral upper respiratory infection.  Symptoms should improve over the next week to 10 days.  If you develop chest pain or shortness of breath, go to the emergency room.  COVID-19 testing is negative.  Some things that can make you feel better are: - Increased rest - Increasing fluid with water /sugar free electrolytes - Acetaminophen  and ibuprofen as needed for fever/pain - Salt water  gargling, chloraseptic spray and throat lozenges - OTC guaifenesin  (Mucinex ) 600 mg twice daily - Saline sinus flushes or a neti pot - Humidifying the air -Tessalon  Perles every 8 hours as needed for dry cough   If the vertigo/dizziness worsens, seek care in the ER.

## 2024-10-01 NOTE — ED Provider Notes (Signed)
 [facility]    CSN: [id number] Arrival date & time: 10/01/24  1538      History   Chief Complaint No chief complaint on file.   HPI Megan Gibbs is a 73 y.o. female.   Patient presents today with 2-day history of right sided headache, sinus pressure, tooth pain, stuffy and runny nose, postnasal drainage, congestion, and ear pain.  She also endorses slight dizziness worse with head movement or bending over, staggering when going from sitting to standing, and feeling woozy.  No recent fall or head trauma.  No issues with memory, swallowing, or remembering words.  No fever, bodyaches, chills, shortness of breath, or chest pain.  No sore throat, abdominal pain, nausea/vomiting, or diarrhea.  She reports she noticed her vision worsening a little bit today but thinks she needs to get her eyes checked.  No known sick contacts.  Has not taken anything for symptoms so far.     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   Hyperthyroidism 08/08/2021   Vitamin D  deficiency 05/23/2021   Mixed hyperlipidemia 03/30/2019   Underweight due to inadequate caloric intake 07/01/2015   Poor appetite 06/21/2015   Papilloma of right breast 12/01/2014   FH: breast cancer in first degree relative 10/11/2014   FH: type 2 diabetes mellitus 10/11/2014   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 Leeann    BREAST BIOPSY     BREAST EXCISIONAL BIOPSY     BREAST LUMPECTOMY WITH RADIOACTIVE SEED LOCALIZATION Right 12/01/2014   Procedure: RIGHT BREAST LUMPECTOMY WITH RADIOACTIVE SEED LOCALIZATION;  Surgeon: Elon Pacini, MD;  Location: [facility];  Service: General;  Laterality: Right;   CATARACT EXTRACTION W/PHACO Right 06/15/2022   Procedure: CATARACT EXTRACTION PHACO AND INTRAOCULAR LENS PLACEMENT (IOC);  Surgeon: Harrie Agent, MD;  Location: [facility];  Service: Ophthalmology;  Laterality: Right;  CDE: 10.49   CATARACT EXTRACTION W/PHACO Left 07/09/2022   Procedure: CATARACT EXTRACTION PHACO AND INTRAOCULAR LENS PLACEMENT (IOC);  Surgeon: Harrie Agent, MD;  Location: [facility];  Service: Ophthalmology;  Laterality: Left;  CDE: 9.15   COLONOSCOPY N/A 04/19/2014   Procedure: COLONOSCOPY;  Surgeon: Lamar CHRISTELLA Hollingshead, MD;  Location: [facility];  Service: Endoscopy;  Laterality: N/A;  9:30 AM   COLONOSCOPY N/A 06/25/2024   Procedure: COLONOSCOPY;  Surgeon: Hollingshead Lamar CHRISTELLA, MD;  Location: [facility];  Service: Endoscopy;  Laterality: N/A;  12:00pm, asa 2   DILATION AND CURETTAGE OF UTERUS     TUBAL LIGATION     1976  OB History   No obstetric history on file.      Home Medications    Prior to Admission medications   Medication Sig Start Date End Date Taking? Authorizing Provider  benzonatate  (TESSALON ) 100 MG capsule Take 1 capsule (100 mg total) by mouth 3 (three) times daily as needed for cough. Do not take with alcohol or while operating or driving heavy machinery 89/7/74  Yes Chandra Harlene LABOR, NP  alendronate  (FOSAMAX ) 70 MG tablet Take with a full glass of water  on an empty stomach. 11/29/22   Nida, Gebreselassie W, MD  azelastine   (ASTELIN ) 0.1 % nasal spray Place 2 sprays into both nostrils 2 (two) times daily. Use in each nostril as directed 12/05/23   Antonetta Rollene BRAVO, MD  busPIRone  (BUSPAR ) 5 MG tablet Take 1 tablet (5 mg total) by mouth 2 (two) times daily. 05/05/24   Antonetta Rollene BRAVO, MD  Calcium  Carb-Cholecalciferol (OYSTER SHELL CALCIUM  W/D) 500-5 MG-MCG TABS TAKE (1) TABLET TWICE DAILY. 03/11/23   Antonetta Rollene BRAVO, MD  denosumab  (PROLIA ) 60 MG/ML SOSY injection Inject 60 mg into the skin every 6 (six) months. 06/02/24   Nida, Gebreselassie W, MD  levothyroxine  (SYNTHROID ) 50 MCG tablet Take 1 tablet (50 mcg total) by mouth daily before breakfast. 09/18/24   Nida, Gebreselassie W, MD  mirtazapine  (REMERON ) 7.5 MG tablet Take 1 tablet (7.5 mg total) by mouth at bedtime. 05/05/24   Antonetta Rollene BRAVO, MD  montelukast  (SINGULAIR ) 10 MG tablet take one tablet by mouth once daily , as needed, for allergies 08/07/24   Antonetta Rollene BRAVO, MD  Multiple Vitamins-Minerals (MULTIVITAMIN ADULTS 50+ PO) Take 1 tablet by mouth daily.    [provider]  omega-3 fish oil (MAXEPA) 1000 MG CAPS capsule Take 2 capsules by mouth daily.     [provider]    Family History Family History  Problem Relation Age of Onset   Diabetes Mother    Hypertension Mother    Asthma Sister    Thyroid  disease Sister    COPD Sister    Anemia Sister    Diabetes Sister    Hypertension Sister    Diabetes Sister    Schizophrenia Father    Drug abuse Brother    Alcohol abuse Brother    Sleep apnea Grandchild    ADD / ADHD Grandchild    Seizures Grandchild    Breast cancer Sister 67   Thyroid  disease Sister    Hypertension Brother    Anxiety disorder Maternal Aunt    Hypertension Daughter    BRCA 1/2 Daughter    Anemia Daughter    Hypertension Daughter    Asthma Daughter    Colon cancer Neg Hx     Social History Social History   Tobacco Use   Smoking status: Never   Smokeless tobacco: Never  Vaping Use   Vaping  status: Never Used  Substance Use Topics   Alcohol use: No   Drug use: No     Allergies   Patient has no known allergies.   Review of Systems Review of Systems Per HPI  Physical Exam Triage Vital Signs ED Triage Vitals  Encounter Vitals Group     BP 10/01/24 1549 (!) 167/79     Girls Systolic BP Percentile --      Girls Diastolic BP Percentile --      Boys Systolic BP Percentile --      Boys Diastolic BP Percentile --      Pulse Rate  10/01/24 1549 83     Resp 10/01/24 1549 20     Temp 10/01/24 1549 99.2 F (37.3 C)     Temp Source 10/01/24 1549 Oral     SpO2 10/01/24 1549 98 %     Weight --      Height --      Head Circumference --      Peak Flow --      Pain Score 10/01/24 1552 6     Pain Loc --      Pain Education --      Exclude from Growth Chart --    No data found.  Updated Vital Signs BP (!) 167/79 (BP Location: Right Arm)   Pulse 83   Temp 99.2 F (37.3 C) (Oral)   Resp 20   SpO2 98%   BP recheck 144/82  Visual Acuity Right Eye Distance:   Left Eye Distance:   Bilateral Distance:    Right Eye Near:   Left Eye Near:    Bilateral Near:     Physical Exam Vitals and nursing note reviewed.  Constitutional:      General: She is not in acute distress.    Appearance: Normal appearance. She is not toxic-appearing.  HENT:     Head: Normocephalic and atraumatic.     Right Ear: Tympanic membrane, ear canal and external ear normal. There is no impacted cerumen.     Left Ear: Tympanic membrane, ear canal and external ear normal. There is no impacted cerumen.     Nose: Nose normal. No congestion or rhinorrhea.     Right Sinus: No maxillary sinus tenderness or frontal sinus tenderness.     Left Sinus: No maxillary sinus tenderness or frontal sinus tenderness.     Mouth/Throat:     Mouth: Mucous membranes are moist.     Pharynx: Oropharynx is clear. No posterior oropharyngeal erythema.  Eyes:     General: No scleral icterus.    Extraocular Movements:  Extraocular movements intact.     Right eye: Normal extraocular motion.     Left eye: Normal extraocular motion.     Pupils: Pupils are equal, round, and reactive to light.  Cardiovascular:     Rate and Rhythm: Normal rate and regular rhythm.  Pulmonary:     Effort: Pulmonary effort is normal. No respiratory distress.     Breath sounds: Normal breath sounds. No wheezing, rhonchi or rales.  Musculoskeletal:     Cervical back: Normal range of motion.  Lymphadenopathy:     Cervical: No cervical adenopathy.  Skin:    General: Skin is warm and dry.     Capillary Refill: Capillary refill takes less than 2 seconds.     Coloration: Skin is not jaundiced or pale.     Findings: No erythema.  Neurological:     General: No focal deficit present.     Mental Status: She is alert and oriented to person, place, and time.     Cranial Nerves: Cranial nerves 2-12 are intact.     Sensory: Sensation is intact.     Motor: Motor function is intact.     Coordination: Coordination is intact. Romberg sign negative. Heel to [facility] Test normal. Rapid alternating movements normal.     Gait: Gait is intact. Gait normal.      UC Treatments / Results  Labs (all labs ordered are listed, but only abnormal results are displayed) Labs Reviewed  POC SOFIA SARS ANTIGEN FIA    EKG  Radiology No results found.  Procedures Procedures (including critical care time)  Medications Ordered in UC Medications - No data to display  Initial Impression / Assessment and Plan / UC Course  I have reviewed the triage vital signs and the nursing notes.  Pertinent labs & imaging results that were available during my care of the patient were reviewed by me and considered in my medical decision making (see chart for details).   Patient is well-appearing, normotensive, afebrile, not tachycardic, not tachypneic, oxygenating well on room air.   Blood pressure recheck is much improved in urgent care today and neurological  examination is reassuring.  1. Vertigo 2. Viral sinusitis Suspect viral etiology COVID-19 testing is negative Supportive care discussed with the patient, start cough suppressant medication and push hydration Recommended close follow up with PCP regarding vertigo and vision changes - if persists/worsens ER evaluation warranted ER and return precautions discussed   The patient was given the opportunity to ask questions.  All questions answered to their satisfaction.  The patient is in agreement to this plan.   Final Clinical Impressions(s) / UC Diagnoses   Final diagnoses:  Vertigo  Viral sinusitis     Discharge Instructions      You have a viral upper respiratory infection.  Symptoms should improve over the next week to 10 days.  If you develop chest pain or shortness of breath, go to the emergency room.  COVID-19 testing is negative.  Some things that can make you feel better are: - Increased rest - Increasing fluid with water /sugar free electrolytes - Acetaminophen  and ibuprofen as needed for fever/pain - Salt water  gargling, chloraseptic spray and throat lozenges - OTC guaifenesin  (Mucinex ) 600 mg twice daily - Saline sinus flushes or a neti pot - Humidifying the air -Tessalon  Perles every 8 hours as needed for dry cough   If the vertigo/dizziness worsens, seek care in the ER.     ED Prescriptions     Medication Sig Dispense Auth. Provider   benzonatate  (TESSALON ) 100 MG capsule Take 1 capsule (100 mg total) by mouth 3 (three) times daily as needed for cough. Do not take with alcohol or while operating or driving heavy machinery 21 capsule Chandra Harlene LABOR, NP      PDMP not reviewed this encounter.   Chandra Harlene LABOR, NP 10/01/24 [phone]

## 2024-11-06 ENCOUNTER — Ambulatory Visit

## 2024-11-06 DIAGNOSIS — Z23 Encounter for immunization: Secondary | ICD-10-CM

## 2024-11-19 LAB — COMPREHENSIVE METABOLIC PANEL WITH GFR
ALT: 19 IU/L (ref 0–32)
AST: 24 IU/L (ref 0–40)
Albumin: 4.7 g/dL (ref 3.8–4.8)
Alkaline Phosphatase: 103 IU/L (ref 49–135)
BUN/Creatinine Ratio: 13 (ref 12–28)
BUN: 10 mg/dL (ref 8–27)
Bilirubin Total: 0.4 mg/dL (ref 0.0–1.2)
CO2: 20 mmol/L (ref 20–29)
Calcium: 9.8 mg/dL (ref 8.7–10.3)
Chloride: 103 mmol/L (ref 96–106)
Creatinine, Ser: 0.78 mg/dL (ref 0.57–1.00)
Globulin, Total: 2.5 g/dL (ref 1.5–4.5)
Glucose: 79 mg/dL (ref 70–99)
Potassium: 4.4 mmol/L (ref 3.5–5.2)
Sodium: 140 mmol/L (ref 134–144)
Total Protein: 7.2 g/dL (ref 6.0–8.5)
eGFR: 80 mL/min/1.73 (ref >59)

## 2024-11-19 LAB — T4, FREE: Free T4: 1.31 ng/dL (ref 0.82–1.77)

## 2024-11-19 LAB — TSH: TSH: 1.32 u[IU]/mL (ref 0.450–4.500)

## 2024-11-19 LAB — VITAMIN D 25 HYDROXY (VIT D DEFICIENCY, FRACTURES): Vit D, 25-Hydroxy: 64.3 ng/mL (ref 30.0–100.0)

## 2024-12-03 ENCOUNTER — Encounter: Payer: Self-pay | Admitting: "Endocrinology

## 2024-12-03 ENCOUNTER — Ambulatory Visit (INDEPENDENT_AMBULATORY_CARE_PROVIDER_SITE_OTHER): Admitting: "Endocrinology

## 2024-12-03 VITALS — BP 122/68 | HR 79 | Resp 16 | Ht 61.0 in | Wt 116.8 lb

## 2024-12-03 DIAGNOSIS — E782 Mixed hyperlipidemia: Secondary | ICD-10-CM | POA: Diagnosis not present

## 2024-12-03 DIAGNOSIS — E559 Vitamin D deficiency, unspecified: Secondary | ICD-10-CM

## 2024-12-03 DIAGNOSIS — M810 Age-related osteoporosis without current pathological fracture: Secondary | ICD-10-CM

## 2024-12-03 DIAGNOSIS — E890 Postprocedural hypothyroidism: Secondary | ICD-10-CM | POA: Diagnosis not present

## 2024-12-03 DIAGNOSIS — Z5320 Procedure and treatment not carried out because of patient's decision for unspecified reasons: Secondary | ICD-10-CM

## 2024-12-03 MED ORDER — ALENDRONATE SODIUM 70 MG PO TABS: ORAL_TABLET | ORAL | 3 refills | Status: AC

## 2024-12-03 NOTE — Progress Notes (Signed)
 12/03/2024     Endocrinology follow-up note   Subjective:    Patient ID: Megan Gibbs, female    DOB: 12/22/1951,    Past Medical History:  Diagnosis Date   Allergic rhinitis    Anemia    Anxiety    Back pain    Depression    Hypokalemia    Hypothyroidism    Osteoporosis    Prediabetes    Seasonal allergies    Seasonal allergies    Vertigo    Wears glasses    Past Surgical History:  Procedure Laterality Date   BACK SURGERY  1992 & 2009   Dr. Leeann    BREAST BIOPSY     BREAST EXCISIONAL BIOPSY     BREAST LUMPECTOMY WITH RADIOACTIVE SEED LOCALIZATION Right 12/01/2014   Procedure: RIGHT BREAST LUMPECTOMY WITH RADIOACTIVE SEED LOCALIZATION;  Surgeon: Elon Pacini, MD;  Location: [facility];  Service: General;  Laterality: Right;   CATARACT EXTRACTION W/PHACO Right 06/15/2022   Procedure: CATARACT EXTRACTION PHACO AND INTRAOCULAR LENS PLACEMENT (IOC);  Surgeon: Harrie Agent, MD;  Location: [facility];  Service: Ophthalmology;  Laterality: Right;  CDE: 10.49   CATARACT EXTRACTION W/PHACO Left 07/09/2022   Procedure: CATARACT EXTRACTION PHACO AND INTRAOCULAR LENS PLACEMENT (IOC);  Surgeon: Harrie Agent, MD;  Location: [facility];  Service: Ophthalmology;  Laterality: Left;  CDE: 9.15   COLONOSCOPY N/A 04/19/2014   Procedure: COLONOSCOPY;  Surgeon: Lamar CHRISTELLA Hollingshead, MD;  Location: [facility];  Service: Endoscopy;  Laterality: N/A;  9:30 AM   COLONOSCOPY N/A 06/25/2024   Procedure: COLONOSCOPY;  Surgeon: Hollingshead Lamar CHRISTELLA, MD;  Location: [facility];  Service: Endoscopy;  Laterality: N/A;  12:00pm, asa 2   DILATION AND CURETTAGE OF UTERUS     TUBAL LIGATION     1976   Social History   Socioeconomic History   Marital status: Divorced    Spouse name: Not on file   Number of children: 3   Years of education: Not on file   Highest education level: Not on file  Occupational History   Occupation: disabled     Employer: UNEMPLOYED  Tobacco Use   Smoking status:  Never   Smokeless tobacco: Never  Vaping Use   Vaping status: Never Used  Substance and Sexual Activity   Alcohol use: No   Drug use: No   Sexual activity: Yes    Birth control/protection: Post-menopausal  Other Topics Concern   Not on file  Social History Narrative   DOING A LOT OF TRAVELING WITH HER CHURCH.    Social Drivers of Corporate Investment Banker Strain: Low Risk  (08/24/2024)   Overall Financial Resource Strain (CARDIA)    Difficulty of Paying Living Expenses: Not hard at all  Food Insecurity: No Food Insecurity (08/24/2024)   Hunger Vital Sign    Worried About Running Out of Food in the Last Year: Never true    Ran Out of Food in the Last Year: Never true  Transportation Needs: No Transportation Needs (08/24/2024)   PRAPARE - Administrator, Civil Service (Medical): No    Lack of Transportation (Non-Medical): No  Physical Activity: Inactive (08/24/2024)   Exercise Vital Sign    Days of Exercise per Week: 0 days    Minutes of Exercise per Session: 0 min  Stress: No Stress Concern Present (08/24/2024)   [employer] of Occupational Health - Occupational Stress Questionnaire    Feeling of Stress: Not at all  Social Connections: Moderately Integrated (08/24/2024)   Social Connection and Isolation Panel    Frequency of Communication with Friends and Family: More than three times a week    Frequency of Social Gatherings with Friends and Family: More than three times a week    Attends Religious Services: More than 4 times per year    Active Member of [employer] or Organizations: Yes    Attends Banker Meetings: More than 4 times per year    Marital Status: Divorced   Outpatient Encounter Medications as of 12/03/2024  Medication Sig   azelastine  (ASTELIN ) 0.1 % nasal spray Place 2 sprays into both nostrils 2 (two) times daily. Use in each nostril as directed   benzonatate  (TESSALON ) 100 MG capsule Take 1 capsule (100 mg total) by mouth 3 (three)  times daily as needed for cough. Do not take with alcohol or while operating or driving heavy machinery   busPIRone  (BUSPAR ) 5 MG tablet Take 1 tablet (5 mg total) by mouth 2 (two) times daily.   Calcium  Carb-Cholecalciferol (OYSTER SHELL CALCIUM  W/D) 500-5 MG-MCG TABS TAKE (1) TABLET TWICE DAILY.   levothyroxine  (SYNTHROID ) 50 MCG tablet Take 1 tablet (50 mcg total) by mouth daily before breakfast.   mirtazapine  (REMERON ) 7.5 MG tablet Take 1 tablet (7.5 mg total) by mouth at bedtime.   montelukast  (SINGULAIR ) 10 MG tablet take one tablet by mouth once daily , as needed, for allergies   Multiple Vitamins-Minerals (MULTIVITAMIN ADULTS 50+ PO) Take 1 tablet by mouth daily.   omega-3 fish oil (MAXEPA) 1000 MG CAPS capsule Take 2 capsules by mouth daily.    [DISCONTINUED] alendronate  (FOSAMAX ) 70 MG tablet Take with a full glass of water  on an empty stomach.   alendronate  (FOSAMAX ) 70 MG tablet Take with a full glass of water  on an empty stomach.   [DISCONTINUED] denosumab  (PROLIA ) 60 MG/ML SOSY injection Inject 60 mg into the skin every 6 (six) months. (Patient not taking: Reported on 12/03/2024)   No facility-administered encounter medications on file as of 12/03/2024.   ALLERGIES: No Known Allergies VACCINATION STATUS: Immunization History  Administered Date(s) Administered   Fluad Quad(high Dose 65+) 09/14/2019, 10/24/2020, 11/07/2021, 09/11/2022   Fluad Trivalent(High Dose 65+) 10/03/2023   INFLUENZA, HIGH DOSE SEASONAL PF 11/06/2024   Influenza Split 09/24/2012   Influenza Whole 10/04/2009, 09/29/2010, 09/17/2011   Influenza,inj,Quad PF,6+ Mos 10/29/2013, 10/11/2014, 09/20/2015, 08/20/2016, 09/17/2017, 09/18/2018   Moderna Covid-19 Vaccine Bivalent Booster 31yrs & up 11/13/2022, 12/18/2023, 11/13/2024   Moderna SARS-COV2 Booster Vaccination 07/04/2021, 11/21/2021   Moderna Sars-Covid-2 Vaccination 03/09/2020, 04/10/2020, 11/10/2020   Pneumococcal Conjugate-13 03/17/2015   Pneumococcal  Polysaccharide-23 12/18/2016   Td 03/24/2010   Zoster Recombinant(Shingrix) 04/10/2023, 06/11/2023   Zoster, Live 09/20/2011    HPI  73 yr old female with medical hx as above.  She is following in this clinic for management of osteoporosis, hypothyroidism.  After satisfactory bone density studies, she was considered for Prolia  intervention, however, patient did not afford the co-pays for it.  She has not taken it for the last several months.  She was started on Fosamax  in 2015.  She tolerated this medication.  She has had lumbar disc prolapse for which she underwent surgery in 2002 and 2009. Her last bone density was in May 26, 2024 with no significant change from her prior studies.    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  She was given RAI thyroid  ablation which helped her symptoms resolve and patient presents with  significant clinical improvement.  She is currently on levothyroxine  50 mcg p.o. daily before breakfast.   Her previsit thyroid  function tests are consistent with appropriate replacement.    She returns with continued clinical improvement   See notes from previous visits.  She has mildly fluctuating body weight.  She has not made a change in her diet.  Her previsit labs still show significant dyslipidemia.       She denies dysphagia, shortness of breath, nor voice change. She  has 3 grown children. she denies parathyroid, thyroid  dysfunction.  She denies history of fragility fracture.  She remains on vitamin D  and low-dose calcium  supplement. Review of her medical records also indicates hyperlipidemia not on treatment.  Her recent labs shows that her dyslipidemia is worsening, she has not continued on her statin discussed and prescribed during her last visit.     Review of Systems Limited as above.  Objective:    BP 122/68   Pulse 79   Resp 16   Ht 5' 1 (1.549 m)   Wt 116 lb 12.8 oz (53 kg)   SpO2 98%   BMI 22.07 kg/m   Wt Readings from  Last 3 Encounters:  12/03/24 116 lb 12.8 oz (53 kg)  08/24/24 115 lb (52.2 kg)  06/25/24 115 lb (52.2 kg)    Physical Exam     Results for orders placed or performed during the hospital encounter of 10/01/24  POC SARS Coronavirus 2 Ag   Collection Time: 10/01/24  4:27 PM  Result Value Ref Range   SARS Coronavirus 2 Ag Negative Negative   Complete Blood Count (Most recent): Lab Results  Component Value Date   WBC 4.5 04/28/2024   HGB 12.5 04/28/2024   HCT 38.7 04/28/2024   MCV 84 04/28/2024   PLT 229 04/28/2024   Chemistry (most recent): Lab Results  Component Value Date   NA 140 11/18/2024   K 4.4 11/18/2024   CL 103 11/18/2024   CO2 20 11/18/2024   BUN 10 11/18/2024   CREATININE 0.78 11/18/2024   Diabetic Labs (most recent): Lab Results  Component Value Date   HGBA1C 5.5 12/11/2016   HGBA1C 5.6 12/12/2015   HGBA1C 5.6 06/21/2015   Lipid Panel     Component Value Date/Time   CHOL 218 (H) 05/26/2024 0905   TRIG 116 05/26/2024 0905   HDL 64 05/26/2024 0905   CHOLHDL 3.4 05/26/2024 0905   CHOLHDL 3.1 03/07/2020 0952   VLDL 17 04/17/2017 0958   LDLCALC 134 (H) 05/26/2024 0905   LDLCALC 105 (H) 03/07/2020 0952   LABVLDL 20 05/26/2024 0905     Assessment & Plan:  1.  RAI induced hypothyroidism She is status post RAI thyroid  ablation for hyper functioning nodule in the left lobe of her thyroid . -Her thyroid  function tests are consistent with appropriate replacement.  She is advised to continue levothyroxine  50 mcg p.o. daily before breakfast.     - We discussed about the correct intake of her thyroid  hormone, on empty stomach at fasting, with water , separated by at least 30 minutes from breakfast and other medications,  and separated by more than 4 hours from calcium , iron, multivitamins, acid reflux medications (PPIs). -Patient is made aware of the fact that thyroid  hormone replacement is needed for life, dose to be adjusted by periodic monitoring of thyroid   function tests.   2. 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  She has tolerated Fosamax  70 mg p.o. weekly since  2015, however due to suboptimal treatment outcomes based on bone density results from May 2025, she was considered for Prolia  intervention.  However, her insurance did not provide coverage and she could not afford the co-pays.  She will be considered for Reclast infusion if her insurance provides coverage.  In the meantime, she is advised to resume Fosamax  last p.o. weekly.  Side effects and precaution discussed with her.   She she has no recent fragility fracture.    Her PTH is normal along with calcium  and TFTs, ruling out secondary cause of osteoporosis.  Risk of untreated osteoporosis is discussed with her in detail.   3) dyslipidemia: Not on medications.  She has not taken her statin despite long discussion and prescription during her last visit and she still wants to avoid this intervention, does not really have a good reason.  - she acknowledges that there is a room for improvement in her food and drink choices. - Suggestion is made for her to avoid simple carbohydrates  from her diet including Cakes, Sweet Desserts, Ice Cream, Soda (diet and regular), Sweet Tea, Candies, Chips, Cookies, Store Bought Juices, Alcohol , Artificial Sweeteners,  Coffee Creamer, and Sugar-free Products, Lemonade. This will help patient to have more stable blood glucose profile and potentially avoid unintended weight gain.  The following Lifestyle Medicine recommendations according to American College of Lifestyle Medicine  [facility]) were discussed and and offered to patient and she  agrees to start the journey:  A. Whole Foods, Plant-Based Nutrition comprising of fruits and vegetables, plant-based proteins, whole-grain carbohydrates was discussed in detail with the patient.   A list for source of those nutrients were also provided to the patient.  Patient will  use only water  or unsweetened tea for hydration. B.  The need to stay away from risky substances including alcohol, smoking; obtaining 7 to 9 hours of restorative sleep, at least 150 minutes of moderate intensity exercise weekly, the importance of healthy social connections,  and stress management techniques were discussed. C.  A full color page of  Calorie density of various food groups per pound showing examples of each food groups was provided to the patient.   4.  Vitamin D  deficiency -She is now vitamin D  replete at 64.3.  She is advised to continue her current supplements of vitamin D  and calcium  supplements.    I advised patient to maintain close follow up with their PCP for primary care needs.   I spent  25  minutes in the care of the patient today including review of labs from Thyroid  Function, CMP, and other relevant labs ; imaging/biopsy records (current and previous including abstractions from other facilities); face-to-face time discussing  her lab results and symptoms, medications doses, her options of short and long term treatment based on the latest standards of care / guidelines;   and documenting the encounter.  Megan Gibbs  participated in the discussions, expressed understanding, and voiced agreement with the above plans.  All questions were answered to her satisfaction. she is encouraged to contact clinic should she have any questions or concerns prior to her return visit.   Follow up plan: Return in about 6 months (around 06/03/2025) for Refer her for Reclast Infusion.  Ranny Earl, MD Phone: [phone]  Fax: [phone]  -  This note was partially dictated with voice recognition software. Similar sounding words can be transcribed inadequately or may not  be corrected upon review.  12/03/2024, 10:55 AM

## 2024-12-10 ENCOUNTER — Other Ambulatory Visit (HOSPITAL_COMMUNITY): Payer: Self-pay | Admitting: "Endocrinology

## 2024-12-11 ENCOUNTER — Other Ambulatory Visit (HOSPITAL_COMMUNITY): Payer: Self-pay | Admitting: Family Medicine

## 2024-12-11 ENCOUNTER — Ambulatory Visit: Admitting: Family Medicine

## 2024-12-11 ENCOUNTER — Encounter: Payer: Self-pay | Admitting: Family Medicine

## 2024-12-11 ENCOUNTER — Telehealth: Payer: Self-pay

## 2024-12-11 VITALS — BP 150/80 | HR 70 | Resp 18 | Ht 61.0 in | Wt 116.0 lb

## 2024-12-11 DIAGNOSIS — E782 Mixed hyperlipidemia: Secondary | ICD-10-CM

## 2024-12-11 DIAGNOSIS — Z1231 Encounter for screening mammogram for malignant neoplasm of breast: Secondary | ICD-10-CM

## 2024-12-11 DIAGNOSIS — Z0001 Encounter for general adult medical examination with abnormal findings: Secondary | ICD-10-CM

## 2024-12-11 DIAGNOSIS — R030 Elevated blood-pressure reading, without diagnosis of hypertension: Secondary | ICD-10-CM | POA: Insufficient documentation

## 2024-12-11 MED ORDER — MONTELUKAST SODIUM 10 MG PO TABS: ORAL_TABLET | ORAL | 0 refills | Status: AC

## 2024-12-11 MED ORDER — MIRTAZAPINE 7.5 MG PO TABS: 7.5000 mg | ORAL_TABLET | Freq: Every day | ORAL | 5 refills | Status: AC

## 2024-12-11 MED ORDER — OYSTER SHELL CALCIUM/D3 500-5 MG-MCG PO TABS: 1.0000 | ORAL_TABLET | Freq: Two times a day (BID) | ORAL | 3 refills | Status: AC

## 2024-12-11 MED ORDER — AZELASTINE HCL 0.1 % NA SOLN: 2.0000 | Freq: Two times a day (BID) | NASAL | 5 refills | Status: DC

## 2024-12-11 NOTE — Assessment & Plan Note (Signed)
 DASH diet and commitment to daily physical activity for a minimum of 30 minutes discussed and encouraged, as a part of hypertension management. The importance of attaining a healthy weight is also discussed. Re eval in 8  to 10 weeks     12/11/2024   10:21 AM 12/11/2024   10:20 AM 12/11/2024    9:54 AM 12/03/2024    9:40 AM 10/01/2024    3:49 PM 08/24/2024    9:52 AM 06/25/2024    1:13 PM  BP/Weight  Systolic BP 150 150 152 122 167 -- 99  Diastolic BP 80 80 82 68 79 -- 50  Wt. (Lbs)   116 116.8  115   BMI   21.92 kg/m2 22.07 kg/m2  21.73 kg/m2

## 2024-12-11 NOTE — Assessment & Plan Note (Signed)
 Hyperlipidemia:Low fat diet discussed and encouraged.   Lipid Panel  Lab Results  Component Value Date   CHOL 218 (H) 05/26/2024   HDL 64 05/26/2024   LDLCALC 134 (H) 05/26/2024   TRIG 116 05/26/2024   CHOLHDL 3.4 05/26/2024     Updated lab needed at/ before next visit.

## 2024-12-11 NOTE — Assessment & Plan Note (Signed)

## 2024-12-11 NOTE — Progress Notes (Signed)
° ° °  Megan Gibbs     MRN: [id number]      DOB: Jun 02, 1951  Chief Complaint  Patient presents with   Annual Exam    Cpe     HPI: Patient is in for annual physical exam. No other health concerns are expressed or addressed at the visit. Recent labs,  are reviewed. Immunization is reviewed , and  needs TdaP   PE: BP (!) 150/80   Pulse 70   Resp 18   Ht 5' 1 (1.549 m)   Wt 116 lb (52.6 kg)   SpO2 97%   BMI 21.92 kg/m   Pleasant  female, alert and oriented x 3, in no cardio-pulmonary distress. Afebrile. HEENT No facial trauma or asymetry. Sinuses non tender.  Extra occullar muscles intact.. External ears normal, . Neck: supple, no adenopathy,JVD or thyromegaly.No bruits.  Chest: Clear to ascultation bilaterally.No crackles or wheezes. Non tender to palpation  Cardiovascular system; Heart sounds normal,  S1 and  S2 ,no S3.  No murmur, or thrill. Apical beat not displaced Peripheral pulses normal.  Abdomen: Soft, non tender, no organomegaly or masses. No bruits. Bowel sounds normal. No guarding, tenderness or rebound.   Musculoskeletal exam: Full ROM of spine, hips , shoulders and knees. No deformity ,swelling or crepitus noted. No muscle wasting or atrophy.   Neurologic: Cranial nerves 2 to 12 intact. Power, tone ,sensation and reflexes normal throughout. No disturbance in gait. No tremor.  Skin: Intact, no ulceration, erythema , scaling or rash noted. Pigmentation normal throughout  Psych; Normal mood and affect. Judgement and concentration normal   Assessment & Plan:  Annual visit for general adult medical examination with abnormal findings Annual exam as documented. Counseling done  re healthy lifestyle involving commitment to 150 minutes exercise per week, heart healthy diet, and attaining healthy weight.The importance of adequate sleep also discussed. Regular seat belt use and home safety, is also discussed. Changes in health habits are decided on  by the patient with goals and time frames  set for achieving them. Immunization and cancer screening needs are specifically addressed at this visit.   Elevated BP without diagnosis of hypertension DASH diet and commitment to daily physical activity for a minimum of 30 minutes discussed and encouraged, as a part of hypertension management. The importance of attaining a healthy weight is also discussed. Re eval in 8  to 10 weeks     12/11/2024   10:21 AM 12/11/2024   10:20 AM 12/11/2024    9:54 AM 12/03/2024    9:40 AM 10/01/2024    3:49 PM 08/24/2024    9:52 AM 06/25/2024    1:13 PM  BP/Weight  Systolic BP 150 150 152 122 167 -- 99  Diastolic BP 80 80 82 68 79 -- 50  Wt. (Lbs)   116 116.8  115   BMI   21.92 kg/m2 22.07 kg/m2  21.73 kg/m2        Mixed hyperlipidemia Hyperlipidemia:Low fat diet discussed and encouraged.   Lipid Panel  Lab Results  Component Value Date   CHOL 218 (H) 05/26/2024   HDL 64 05/26/2024   LDLCALC 134 (H) 05/26/2024   TRIG 116 05/26/2024   CHOLHDL 3.4 05/26/2024     Updated lab needed at/ before next visit.

## 2024-12-11 NOTE — Patient Instructions (Addendum)
 F/u in 8 to 10  weeks, re evaluate blood pressure, will need eKG if elevated  Please schedule mammogram at checkout  Need TdAP at your pharmacy  Fasting lipid, bmp and cBC 1 week before next visit   If Your blood pressure is again elevated you will need medication , please reduce salt and fat inyour diet  Eat mainly fresh or frozen vegetables and fruit, and white meats  Almonds are the healthiest nuts  Start calcium  with D twice daily for bones  It is important that you exercise regularly at least 30 minutes 5 times a week. If you develop chest pain, have severe difficulty breathing, or feel very tired, stop exercising immediately and seek medical attention   Thanks for choosing [facility], we consider it a privelige to serve you.

## 2024-12-11 NOTE — Telephone Encounter (Signed)
 Auth Submission: APPROVED Site of care: Site of care: [facility] Payer: [employer] Medication & CPT/J Code(s) submitted: Reclast (Zolendronic acid) [id number] Diagnosis Code:  Route of submission (phone, fax, portal): portal Phone # Fax # Auth type: Buy/Bill PB Units/visits requested: 5mg  x 1 dose Reference number: [id number] Approval from: 12/11/24 to 12/11/25

## 2024-12-17 ENCOUNTER — Encounter: Attending: "Endocrinology | Admitting: Emergency Medicine

## 2024-12-17 VITALS — BP 151/86 | HR 68 | Temp 98.0°F | Resp 17

## 2024-12-17 DIAGNOSIS — M810 Age-related osteoporosis without current pathological fracture: Secondary | ICD-10-CM | POA: Diagnosis not present

## 2024-12-17 MED ORDER — ZOLEDRONIC ACID 5 MG/100ML IV SOLN
5.0000 mg | Freq: Once | INTRAVENOUS | Status: AC
  Administered 2024-12-17: 10:00:00 5 mg via INTRAVENOUS

## 2024-12-17 MED ORDER — DIPHENHYDRAMINE HCL 25 MG PO CAPS
25.0000 mg | ORAL_CAPSULE | Freq: Once | ORAL | Status: AC
  Administered 2024-12-17: 09:00:00 25 mg via ORAL

## 2024-12-17 MED ORDER — ACETAMINOPHEN 325 MG PO TABS
650.0000 mg | ORAL_TABLET | Freq: Once | ORAL | Status: AC
  Administered 2024-12-17: 09:00:00 650 mg via ORAL

## 2024-12-17 NOTE — Progress Notes (Signed)
 Diagnosis: Osteoporosis  Provider:  Nida, Gebreselassie Woldeberhan MD  Procedure: IV Infusion  IV Type: Peripheral, IV Location: R Hand  Reclast  (Zolendronic Acid), Dose: 5 mg  Infusion Start Time: 1006  Infusion Stop Time: 1034  Post Infusion IV Care: Observation period completed and Peripheral IV Discontinued  Discharge: Condition: Good, Destination: Home . AVS Declined  Performed by:  Delon ONEIDA Officer, RN

## 2025-01-08 ENCOUNTER — Encounter (HOSPITAL_COMMUNITY): Payer: Self-pay

## 2025-01-08 ENCOUNTER — Ambulatory Visit (HOSPITAL_COMMUNITY)
Admission: RE | Admit: 2025-01-08 | Discharge: 2025-01-08 | Disposition: A | Source: Ambulatory Visit | Attending: Family Medicine | Admitting: Family Medicine

## 2025-01-08 ENCOUNTER — Ambulatory Visit (HOSPITAL_COMMUNITY)

## 2025-01-08 DIAGNOSIS — Z1231 Encounter for screening mammogram for malignant neoplasm of breast: Secondary | ICD-10-CM | POA: Diagnosis present

## 2025-02-19 ENCOUNTER — Ambulatory Visit: Payer: Self-pay | Admitting: Family Medicine

## 2025-06-03 ENCOUNTER — Ambulatory Visit: Admitting: "Endocrinology

## 2025-08-25 ENCOUNTER — Ambulatory Visit

## 2025-12-20 ENCOUNTER — Ambulatory Visit
